# Patient Record
Sex: Female | Born: 1950
Health system: Southern US, Community
[De-identification: ages and names within clinical notes are randomized; demographics above are authoritative.]

## PROBLEM LIST (undated history)

## (undated) DIAGNOSIS — E785 Hyperlipidemia, unspecified: Secondary | ICD-10-CM

## (undated) DIAGNOSIS — K219 Gastro-esophageal reflux disease without esophagitis: Secondary | ICD-10-CM

## (undated) DIAGNOSIS — I10 Essential (primary) hypertension: Secondary | ICD-10-CM

## (undated) DIAGNOSIS — N6310 Unspecified lump in the right breast, unspecified quadrant: Secondary | ICD-10-CM

## (undated) DIAGNOSIS — Z923 Personal history of irradiation: Secondary | ICD-10-CM

## (undated) DIAGNOSIS — H269 Unspecified cataract: Secondary | ICD-10-CM

## (undated) DIAGNOSIS — C801 Malignant (primary) neoplasm, unspecified: Secondary | ICD-10-CM

## (undated) DIAGNOSIS — C50919 Malignant neoplasm of unspecified site of unspecified female breast: Secondary | ICD-10-CM

## (undated) DIAGNOSIS — M1990 Unspecified osteoarthritis, unspecified site: Secondary | ICD-10-CM

## (undated) HISTORY — DX: Malignant (primary) neoplasm, unspecified: C80.1

## (undated) HISTORY — PX: EYE SURGERY: SHX253

## (undated) HISTORY — DX: Essential (primary) hypertension: I10

## (undated) HISTORY — DX: Unspecified cataract: H26.9

## (undated) HISTORY — DX: Malignant neoplasm of unspecified site of unspecified female breast: C50.919

## (undated) HISTORY — PX: ROBOTIC ASSISTED LAPAROSCOPIC OVARIAN CYSTECTOMY: SHX6081

## (undated) HISTORY — PX: BREAST BIOPSY: SHX20

## (undated) HISTORY — DX: Hyperlipidemia, unspecified: E78.5

---
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
## 1979-03-03 HISTORY — PX: ECTOPIC PREGNANCY SURGERY: SHX613

## 1998-08-08 ENCOUNTER — Other Ambulatory Visit: Admission: RE | Admit: 1998-08-08 | Discharge: 1998-08-08 | Payer: Self-pay | Admitting: *Deleted

## 1999-09-22 ENCOUNTER — Other Ambulatory Visit: Admission: RE | Admit: 1999-09-22 | Discharge: 1999-09-22 | Payer: Self-pay | Admitting: *Deleted

## 2000-01-28 ENCOUNTER — Other Ambulatory Visit: Admission: RE | Admit: 2000-01-28 | Discharge: 2000-01-28 | Payer: Self-pay | Admitting: *Deleted

## 2000-02-27 ENCOUNTER — Encounter (INDEPENDENT_AMBULATORY_CARE_PROVIDER_SITE_OTHER): Payer: Self-pay | Admitting: Specialist

## 2000-02-27 ENCOUNTER — Other Ambulatory Visit: Admission: RE | Admit: 2000-02-27 | Discharge: 2000-02-27 | Payer: Self-pay | Admitting: *Deleted

## 2000-09-07 ENCOUNTER — Other Ambulatory Visit: Admission: RE | Admit: 2000-09-07 | Discharge: 2000-09-07 | Payer: Self-pay | Admitting: *Deleted

## 2001-09-12 ENCOUNTER — Other Ambulatory Visit: Admission: RE | Admit: 2001-09-12 | Discharge: 2001-09-12 | Payer: Self-pay | Admitting: *Deleted

## 2002-11-29 ENCOUNTER — Other Ambulatory Visit: Admission: RE | Admit: 2002-11-29 | Discharge: 2002-11-29 | Payer: Self-pay | Admitting: *Deleted

## 2003-12-20 ENCOUNTER — Other Ambulatory Visit: Admission: RE | Admit: 2003-12-20 | Discharge: 2003-12-20 | Payer: Self-pay | Admitting: *Deleted

## 2005-06-08 ENCOUNTER — Other Ambulatory Visit: Admission: RE | Admit: 2005-06-08 | Discharge: 2005-06-08 | Payer: Self-pay | Admitting: *Deleted

## 2006-06-30 ENCOUNTER — Other Ambulatory Visit: Admission: RE | Admit: 2006-06-30 | Discharge: 2006-06-30 | Payer: Self-pay | Admitting: *Deleted

## 2007-08-23 ENCOUNTER — Other Ambulatory Visit: Admission: RE | Admit: 2007-08-23 | Discharge: 2007-08-23 | Payer: Self-pay | Admitting: Gynecology

## 2007-11-29 ENCOUNTER — Ambulatory Visit: Payer: Self-pay | Admitting: Unknown Physician Specialty

## 2007-12-20 ENCOUNTER — Ambulatory Visit: Payer: Self-pay | Admitting: Unknown Physician Specialty

## 2008-05-01 ENCOUNTER — Ambulatory Visit: Payer: Self-pay | Admitting: Internal Medicine

## 2008-12-13 ENCOUNTER — Encounter: Payer: Self-pay | Admitting: Anesthesiology

## 2008-12-31 ENCOUNTER — Encounter: Payer: Self-pay | Admitting: Anesthesiology

## 2010-11-28 ENCOUNTER — Ambulatory Visit: Payer: Self-pay | Admitting: Internal Medicine

## 2012-01-15 ENCOUNTER — Other Ambulatory Visit: Payer: Self-pay | Admitting: Gynecology

## 2012-01-15 DIAGNOSIS — R928 Other abnormal and inconclusive findings on diagnostic imaging of breast: Secondary | ICD-10-CM

## 2012-01-20 ENCOUNTER — Ambulatory Visit
Admission: RE | Admit: 2012-01-20 | Discharge: 2012-01-20 | Disposition: A | Discharge status: Home / Self Care | Payer: BC Managed Care – PPO | Source: Ambulatory Visit | Attending: Gynecology | Admitting: Gynecology

## 2012-01-20 DIAGNOSIS — R928 Other abnormal and inconclusive findings on diagnostic imaging of breast: Secondary | ICD-10-CM

## 2012-08-16 ENCOUNTER — Ambulatory Visit: Payer: Self-pay | Admitting: Internal Medicine

## 2012-08-19 ENCOUNTER — Ambulatory Visit: Payer: Self-pay | Admitting: Internal Medicine

## 2012-08-23 LAB — CBC CANCER CENTER
Basophil #: 0 x10 3/mm (ref 0.0–0.1)
Eosinophil #: 0.1 x10 3/mm (ref 0.0–0.7)
Eosinophil %: 2.2 %
HGB: 14.2 g/dL (ref 12.0–16.0)
MCV: 93 fL (ref 80–100)
Monocyte #: 0.3 x10 3/mm (ref 0.2–0.9)
Neutrophil #: 2.7 x10 3/mm (ref 1.4–6.5)
Platelet: 185 x10 3/mm (ref 150–440)
RBC: 4.45 10*6/uL (ref 3.80–5.20)
RDW: 13 % (ref 11.5–14.5)
WBC: 5.8 x10 3/mm (ref 3.6–11.0)

## 2012-08-30 ENCOUNTER — Ambulatory Visit: Payer: Self-pay | Admitting: Internal Medicine

## 2015-04-05 ENCOUNTER — Other Ambulatory Visit: Payer: Self-pay | Admitting: Obstetrics and Gynecology

## 2015-04-05 DIAGNOSIS — R928 Other abnormal and inconclusive findings on diagnostic imaging of breast: Secondary | ICD-10-CM

## 2015-04-12 ENCOUNTER — Ambulatory Visit
Admission: RE | Admit: 2015-04-12 | Discharge: 2015-04-12 | Disposition: A | Discharge status: Home / Self Care | Payer: BC Managed Care – PPO | Source: Ambulatory Visit | Attending: Obstetrics and Gynecology | Admitting: Obstetrics and Gynecology

## 2015-04-12 ENCOUNTER — Other Ambulatory Visit: Payer: Self-pay | Admitting: Obstetrics and Gynecology

## 2015-04-12 DIAGNOSIS — R928 Other abnormal and inconclusive findings on diagnostic imaging of breast: Secondary | ICD-10-CM

## 2015-04-17 ENCOUNTER — Other Ambulatory Visit: Payer: Self-pay | Admitting: Obstetrics and Gynecology

## 2015-04-17 DIAGNOSIS — R928 Other abnormal and inconclusive findings on diagnostic imaging of breast: Secondary | ICD-10-CM

## 2015-04-18 ENCOUNTER — Ambulatory Visit
Admission: RE | Admit: 2015-04-18 | Discharge: 2015-04-18 | Disposition: A | Discharge status: Home / Self Care | Payer: BC Managed Care – PPO | Source: Ambulatory Visit | Attending: Obstetrics and Gynecology | Admitting: Obstetrics and Gynecology

## 2015-04-18 DIAGNOSIS — R928 Other abnormal and inconclusive findings on diagnostic imaging of breast: Secondary | ICD-10-CM

## 2015-04-29 ENCOUNTER — Other Ambulatory Visit: Payer: Self-pay | Admitting: Nurse Practitioner

## 2015-04-29 DIAGNOSIS — K449 Diaphragmatic hernia without obstruction or gangrene: Secondary | ICD-10-CM

## 2015-04-29 DIAGNOSIS — K219 Gastro-esophageal reflux disease without esophagitis: Secondary | ICD-10-CM

## 2015-05-07 ENCOUNTER — Ambulatory Visit: Payer: BC Managed Care – PPO

## 2015-05-07 ENCOUNTER — Other Ambulatory Visit: Payer: BC Managed Care – PPO

## 2015-05-16 ENCOUNTER — Ambulatory Visit: Payer: BC Managed Care – PPO

## 2015-05-16 ENCOUNTER — Other Ambulatory Visit: Payer: Self-pay | Admitting: Nurse Practitioner

## 2015-05-16 ENCOUNTER — Ambulatory Visit: Admission: RE | Admit: 2015-05-16 | Payer: BC Managed Care – PPO | Source: Ambulatory Visit

## 2015-05-16 ENCOUNTER — Ambulatory Visit
Admission: RE | Admit: 2015-05-16 | Discharge: 2015-05-16 | Disposition: A | Discharge status: Home / Self Care | Payer: BC Managed Care – PPO | Source: Ambulatory Visit | Attending: Nurse Practitioner | Admitting: Nurse Practitioner

## 2015-05-16 DIAGNOSIS — K219 Gastro-esophageal reflux disease without esophagitis: Secondary | ICD-10-CM | POA: Diagnosis present

## 2015-05-16 DIAGNOSIS — K224 Dyskinesia of esophagus: Secondary | ICD-10-CM | POA: Diagnosis not present

## 2015-05-16 DIAGNOSIS — K449 Diaphragmatic hernia without obstruction or gangrene: Secondary | ICD-10-CM | POA: Insufficient documentation

## 2015-05-16 DIAGNOSIS — K440 Diaphragmatic hernia with obstruction, without gangrene: Secondary | ICD-10-CM | POA: Diagnosis present

## 2016-05-07 ENCOUNTER — Other Ambulatory Visit: Payer: Self-pay | Admitting: Obstetrics and Gynecology

## 2016-05-07 DIAGNOSIS — R921 Mammographic calcification found on diagnostic imaging of breast: Secondary | ICD-10-CM

## 2016-05-08 ENCOUNTER — Ambulatory Visit
Admission: RE | Admit: 2016-05-08 | Discharge: 2016-05-08 | Disposition: A | Discharge status: Home / Self Care | Payer: Medicare Other | Source: Ambulatory Visit | Attending: Obstetrics and Gynecology | Admitting: Obstetrics and Gynecology

## 2016-05-08 DIAGNOSIS — R921 Mammographic calcification found on diagnostic imaging of breast: Secondary | ICD-10-CM

## 2017-06-01 ENCOUNTER — Encounter: Payer: Self-pay | Admitting: Nurse Practitioner

## 2017-06-09 ENCOUNTER — Other Ambulatory Visit: Payer: Self-pay | Admitting: Internal Medicine

## 2017-06-16 ENCOUNTER — Ambulatory Visit: Payer: Medicare Other | Admitting: Internal Medicine

## 2017-06-16 ENCOUNTER — Encounter: Payer: Self-pay | Admitting: Internal Medicine

## 2017-06-16 VITALS — BP 156/86 | HR 62 | Resp 16 | Ht 62.5 in | Wt 147.2 lb

## 2017-06-16 DIAGNOSIS — Z0001 Encounter for general adult medical examination with abnormal findings: Secondary | ICD-10-CM

## 2017-06-16 DIAGNOSIS — Z124 Encounter for screening for malignant neoplasm of cervix: Secondary | ICD-10-CM

## 2017-06-16 DIAGNOSIS — R300 Dysuria: Secondary | ICD-10-CM | POA: Diagnosis not present

## 2017-06-16 DIAGNOSIS — R030 Elevated blood-pressure reading, without diagnosis of hypertension: Secondary | ICD-10-CM | POA: Diagnosis not present

## 2017-06-16 DIAGNOSIS — Q446 Cystic disease of liver: Secondary | ICD-10-CM | POA: Diagnosis not present

## 2017-06-16 DIAGNOSIS — L659 Nonscarring hair loss, unspecified: Secondary | ICD-10-CM

## 2017-06-16 DIAGNOSIS — E782 Mixed hyperlipidemia: Secondary | ICD-10-CM | POA: Diagnosis not present

## 2017-06-16 MED ORDER — OMEPRAZOLE 20 MG PO CPDR: 20.0000 mg | DELAYED_RELEASE_CAPSULE | Freq: Every day | ORAL | 3 refills | Status: DC

## 2017-06-16 NOTE — Progress Notes (Signed)
[facility] [place], [place] [address]  Internal MEDICINE  Office Visit Note  Patient Name: Kristen Reid  [id number]  [id number]  Date of Service: 06/16/2017  Chief Complaint  Patient presents with  . Annual Exam     HPI Pt is here for routine health maintenance examination. Pt feels well except heartburn occasionally. She does have h/o hyperlipidemia. Thinning of hair, She also has cystic disease of the liver without any elevation in her transaminases   Current Medication: Outpatient Encounter Medications as of 06/16/2017  Medication Sig  . calcium carbonate (CALCIUM 600) 600 MG TABS tablet Take 600 mg by mouth 2 (two) times daily with a meal.  . Cholecalciferol (VITAMIN D3) 2000 units TABS Take by mouth.  . clobetasol (TEMOVATE) 0.05 % external solution APPLY TO AFFECTED AREA EVERY DAY AT BEDTIME  . co-enzyme Q-10 30 MG capsule Take 30 mg by mouth 3 (three) times daily.  . Cyanocobalamin 1000 MCG SUBL Place under the tongue.  . dutasteride (AVODART) 0.5 MG capsule Take 0.5 mg by mouth daily.  . Flaxseed, Linseed, (FLAX SEED OIL) 1300 MG CAPS Take by mouth.  Marland Kitchen Lifitegrast (XIIDRA) 5 % SOLN Apply to eye.  . loratadine (ALLERGY) 10 MG tablet Take 10 mg by mouth daily.  Marland Kitchen omeprazole (PRILOSEC) 20 MG capsule Take 1 capsule (20 mg total) by mouth daily.  . [DISCONTINUED] omeprazole (PRILOSEC) 20 MG capsule Take 20 mg by mouth daily.   No facility-administered encounter medications on file as of 06/16/2017.     Surgical History: Past Surgical History:  Procedure Laterality Date  . BREAST BIOPSY Right 2010 and 04/18/2015  . EYE SURGERY Right    keratectomy     Medical History: Past Medical History:  Diagnosis Date  . Cataract   . Hyperlipidemia     Family History: Family History  Problem Relation Age of Onset  . Heart disease Mother   . Stroke Father     Review of Systems  Constitutional: Negative for chills, fatigue and  unexpected weight change.  HENT: Positive for postnasal drip. Negative for congestion, rhinorrhea, sneezing and sore throat.   Eyes: Negative for redness.  Respiratory: Negative for cough, chest tightness and shortness of breath.   Cardiovascular: Negative for chest pain and palpitations.  Gastrointestinal: Negative for abdominal pain, constipation, diarrhea, nausea and vomiting.       Heartburn  Genitourinary: Negative for dysuria and frequency.  Musculoskeletal: Negative for arthralgias, back pain, joint swelling and neck pain.  Skin: Negative for rash.  Neurological: Negative.  Negative for tremors and numbness.  Hematological: Negative for adenopathy. Does not bruise/bleed easily.  Psychiatric/Behavioral: Negative for behavioral problems (Depression), sleep disturbance and suicidal ideas. The patient is not nervous/anxious.     Vital Signs: BP (!) 156/86   Pulse 62   Resp 16   Ht 5' 2.5" (1.588 m)   Wt 147 lb 3.2 oz (66.8 kg)   SpO2 99%   BMI 26.49 kg/m    Physical Exam  Constitutional: She appears well-developed and well-nourished. No distress.  HENT:  Head: Normocephalic and atraumatic.  Right Ear: External ear normal.  Left Ear: External ear normal.  Nose: Nose normal.  Mouth/Throat: Oropharynx is clear and moist. No oropharyngeal exudate.  Eyes: Pupils are equal, round, and reactive to light. Conjunctivae and EOM are normal. Right eye exhibits no discharge. Left eye exhibits no discharge. No scleral icterus.  Neck: Normal range of motion. Neck supple. No JVD present. No tracheal  deviation present. No thyromegaly present.  Cardiovascular: Normal rate, regular rhythm, normal heart sounds and intact distal pulses. Exam reveals no gallop and no friction rub.  No murmur heard. Pulmonary/Chest: Effort normal and breath sounds normal. No stridor. No respiratory distress. She has no wheezes. She has no rales. She exhibits no tenderness. Right breast exhibits no mass. Left breast  exhibits no mass.  Abdominal: Soft. Bowel sounds are normal. She exhibits no distension and no mass. There is no rebound and no guarding.  Genitourinary: Vagina normal and uterus normal.  Musculoskeletal: Normal range of motion. She exhibits no edema, tenderness or deformity.  Lymphadenopathy:    She has no cervical adenopathy.  Neurological: She is alert. She has normal reflexes. No cranial nerve deficit. She exhibits normal muscle tone. Coordination normal.  Skin: Skin is warm and dry. No rash noted. She is not diaphoretic. No erythema. No pallor.  Psychiatric: She has a normal mood and affect. Her behavior is normal. Judgment and thought content normal.   Assessment/Plan: 1. Encounter for general adult medical examination with abnormal findings - Updated mammogram  2. Mixed hyperlipidemia - Lipid Panel With LDL/HDL Ratio  3. Alopecia of scalp - Pt is menopausal  - CBC with Differential/Platelet - TSH - T4, free  4. Routine cervical smear - Pap is performed   5. Dysuria - Urinalysis, Routine w reflex microscopic  6. Cystic disease of liver - Comprehensive metabolic panel/ Might need u/s f/u   7. Elevated BP without diagnosis of hypertension - Monitor BP at home   General Counseling: Kristen Reid understanding of the findings of todays visit and agrees with plan of treatment. I have discussed any further diagnostic evaluation that may be needed or ordered today. We also reviewed her medications today. she has been encouraged to call the office with any questions or concerns that should arise related to todays visit. Cardiac risk factor modification:  1. Control blood pressure. 2. Exercise as prescribed. 3. Follow low sodium, low fat diet. and low fat and low cholestrol diet. 4. Take ASA 81mg  once a day.  Orders Placed This Encounter  Procedures  . Urinalysis, Routine w reflex microscopic  . CBC with Differential/Platelet  . Lipid Panel With LDL/HDL Ratio  . TSH   . T4, free  . Comprehensive metabolic panel    Meds ordered this encounter  Medications  . omeprazole (PRILOSEC) 20 MG capsule    Sig: Take 1 capsule (20 mg total) by mouth daily.    Dispense:  90 capsule    Refill:  3   Time spent: [place], MD  Internal Medicine

## 2017-06-17 LAB — CBC WITH DIFFERENTIAL/PLATELET
Basophils Absolute: 0 10*3/uL (ref 0.0–0.2)
Basos: 1 %
EOS (ABSOLUTE): 0.1 10*3/uL (ref 0.0–0.4)
EOS: 2 %
HEMATOCRIT: 42.3 % (ref 34.0–46.6)
HEMOGLOBIN: 14.4 g/dL (ref 11.1–15.9)
Immature Grans (Abs): 0 10*3/uL (ref 0.0–0.1)
Immature Granulocytes: 0 %
LYMPHS ABS: 1.8 10*3/uL (ref 0.7–3.1)
Lymphs: 46 %
MCH: 32.4 pg (ref 26.6–33.0)
MCHC: 34 g/dL (ref 31.5–35.7)
MCV: 95 fL (ref 79–97)
MONOCYTES: 7 %
MONOS ABS: 0.3 10*3/uL (ref 0.1–0.9)
NEUTROS ABS: 1.7 10*3/uL (ref 1.4–7.0)
Neutrophils: 44 %
Platelets: 195 10*3/uL (ref 150–379)
RBC: 4.45 x10E6/uL (ref 3.77–5.28)
RDW: 13 % (ref 12.3–15.4)
WBC: 3.8 10*3/uL (ref 3.4–10.8)

## 2017-06-17 LAB — LIPID PANEL WITH LDL/HDL RATIO
Cholesterol, Total: 286 mg/dL — ABNORMAL HIGH (ref 100–199)
HDL: 88 mg/dL (ref >39)
LDL CALC: 178 mg/dL — AB (ref 0–99)
LDL/HDL RATIO: 2 ratio (ref 0.0–3.2)
Triglycerides: 102 mg/dL (ref 0–149)
VLDL Cholesterol Cal: 20 mg/dL (ref 5–40)

## 2017-06-17 LAB — URINALYSIS, ROUTINE W REFLEX MICROSCOPIC
Bilirubin, UA: NEGATIVE
Glucose, UA: NEGATIVE
Ketones, UA: NEGATIVE
LEUKOCYTES UA: NEGATIVE
NITRITE UA: NEGATIVE
Protein, UA: NEGATIVE
RBC, UA: NEGATIVE
Specific Gravity, UA: 1.02 (ref 1.005–1.030)
Urobilinogen, Ur: 0.2 mg/dL (ref 0.2–1.0)
pH, UA: 6 (ref 5.0–7.5)

## 2017-06-17 LAB — COMPREHENSIVE METABOLIC PANEL
A/G RATIO: 2.1 (ref 1.2–2.2)
ALK PHOS: 60 IU/L (ref 39–117)
ALT: 19 IU/L (ref 0–32)
AST: 27 IU/L (ref 0–40)
Albumin: 4.6 g/dL (ref 3.6–4.8)
BILIRUBIN TOTAL: 0.9 mg/dL (ref 0.0–1.2)
BUN/Creatinine Ratio: 15 (ref 12–28)
BUN: 10 mg/dL (ref 8–27)
CHLORIDE: 102 mmol/L (ref 96–106)
CO2: 23 mmol/L (ref 20–29)
Calcium: 9.8 mg/dL (ref 8.7–10.3)
Creatinine, Ser: 0.68 mg/dL (ref 0.57–1.00)
GFR calc non Af Amer: 91 mL/min/{1.73_m2} (ref >59)
GFR, EST AFRICAN AMERICAN: 105 mL/min/{1.73_m2} (ref >59)
GLUCOSE: 93 mg/dL (ref 65–99)
Globulin, Total: 2.2 g/dL (ref 1.5–4.5)
POTASSIUM: 4.3 mmol/L (ref 3.5–5.2)
Sodium: 144 mmol/L (ref 134–144)
Total Protein: 6.8 g/dL (ref 6.0–8.5)

## 2017-06-17 LAB — T4, FREE: FREE T4: 1.16 ng/dL (ref 0.82–1.77)

## 2017-06-17 LAB — TSH: TSH: 2.4 u[IU]/mL (ref 0.450–4.500)

## 2017-06-21 NOTE — Progress Notes (Signed)
All labs look within normal range except worsening lipid profile, mail low fat low chol diet, She should be on therapy too, ask her if it is ok to send RX

## 2017-06-21 NOTE — Progress Notes (Signed)
Patient was notified of increasing lipid profile. I asked if she would like rx but she rather do diet and exercise. She also said you would like to do a calcium deposit check on her and that you could sent it to [place].

## 2017-06-24 ENCOUNTER — Other Ambulatory Visit: Payer: Self-pay | Admitting: Internal Medicine

## 2017-06-24 DIAGNOSIS — E782 Mixed hyperlipidemia: Secondary | ICD-10-CM

## 2017-06-24 NOTE — Progress Notes (Signed)
Forward this to beth, I have entered an order for scan. Its cash only

## 2017-07-10 ENCOUNTER — Encounter: Payer: Self-pay | Admitting: Internal Medicine

## 2017-07-19 ENCOUNTER — Other Ambulatory Visit: Payer: Self-pay

## 2017-07-19 DIAGNOSIS — E785 Hyperlipidemia, unspecified: Secondary | ICD-10-CM

## 2017-07-19 NOTE — Progress Notes (Signed)
Dr. Laurelyn Sickle patient with [facility]

## 2017-08-05 ENCOUNTER — Telehealth: Payer: Self-pay | Admitting: Internal Medicine

## 2017-08-05 ENCOUNTER — Ambulatory Visit (INDEPENDENT_AMBULATORY_CARE_PROVIDER_SITE_OTHER)
Admission: RE | Admit: 2017-08-05 | Discharge: 2017-08-05 | Disposition: A | Discharge status: Home / Self Care | Payer: Medicare Other | Source: Ambulatory Visit | Attending: Cardiovascular Disease | Admitting: Cardiovascular Disease

## 2017-08-05 DIAGNOSIS — E785 Hyperlipidemia, unspecified: Secondary | ICD-10-CM

## 2017-08-05 NOTE — Telephone Encounter (Signed)
Pt advised on test results

## 2017-10-20 ENCOUNTER — Telehealth: Payer: Self-pay

## 2017-10-20 NOTE — Telephone Encounter (Signed)
Spoke with patient informing her that her cologuard results came back negative

## 2018-06-21 ENCOUNTER — Encounter: Payer: Self-pay | Admitting: Internal Medicine

## 2018-08-23 ENCOUNTER — Other Ambulatory Visit: Payer: Self-pay

## 2018-08-23 ENCOUNTER — Ambulatory Visit: Payer: Medicare Other | Admitting: Internal Medicine

## 2018-08-23 ENCOUNTER — Encounter: Payer: Self-pay | Admitting: Internal Medicine

## 2018-08-23 VITALS — BP 150/79 | HR 58 | Resp 16 | Ht 62.5 in | Wt 136.6 lb

## 2018-08-23 DIAGNOSIS — R5383 Other fatigue: Secondary | ICD-10-CM

## 2018-08-23 DIAGNOSIS — E782 Mixed hyperlipidemia: Secondary | ICD-10-CM

## 2018-08-23 DIAGNOSIS — Z0001 Encounter for general adult medical examination with abnormal findings: Secondary | ICD-10-CM

## 2018-08-23 DIAGNOSIS — R030 Elevated blood-pressure reading, without diagnosis of hypertension: Secondary | ICD-10-CM

## 2018-08-23 DIAGNOSIS — J342 Deviated nasal septum: Secondary | ICD-10-CM

## 2018-08-23 DIAGNOSIS — I83813 Varicose veins of bilateral lower extremities with pain: Secondary | ICD-10-CM

## 2018-08-23 DIAGNOSIS — R300 Dysuria: Secondary | ICD-10-CM

## 2018-08-23 NOTE — Progress Notes (Signed)
[facility] [place], [place] [address]  Internal MEDICINE  Office Visit Note  Patient Name: Kristen Reid  [id number]  [id number]  Date of Service: 08/23/2018  Chief Complaint  Patient presents with  . Medicare Wellness  . Hyperlipidemia    Pt is here for routine health maintenance examination c/o fatigue off an on, she has not been taking her b12, She is c/o leg pain due t varicose veins, unable to sleep at night. Takes motrin, will like to see a specialist Her other complaints is nasal congestion , she has h/o deviated septum and thinks it might be getting worse, GERD is under good control with meds, Her Cardaiac calcium scoring was normal.Keeps a blood pressure log, most of the ho,e readings are normal .  Hyperlipidemia This is a chronic problem. The current episode started more than 1 year ago. The problem is controlled. Recent lipid tests were reviewed and are variable. Pertinent negatives include no chest pain or shortness of breath. She is currently on no antihyperlipidemic treatment. The current treatment provides mild improvement of lipids.    Current Medication: Outpatient Encounter Medications as of 08/23/2018  Medication Sig  . Apple Cider Vinegar 300 MG TABS Take by mouth.  . calcium carbonate (CALCIUM 600) 600 MG TABS tablet Take 600 mg by mouth 2 (two) times daily with a meal.  . Cholecalciferol (VITAMIN D3) 2000 units TABS Take by mouth.  . clobetasol (TEMOVATE) 0.05 % external solution APPLY TO AFFECTED AREA EVERY DAY AT BEDTIME  . Coenzyme Q10 (COQ10) 200 MG CAPS Take by mouth.  . dutasteride (AVODART) 0.5 MG capsule Take 0.5 mg by mouth daily.  Marland Kitchen dutasteride (AVODART) 0.5 MG capsule TAKE ONE PILL DAILY X 1 WEEK, THEN ONE PILL ONCE WEEKLY  . famotidine (PEPCID) 20 MG tablet   . Flaxseed, Linseed, (FLAX SEED OIL) 1300 MG CAPS Take by mouth.  Marland Kitchen Lifitegrast (XIIDRA) 5 % SOLN Apply to eye.  . loratadine (CLARITIN) 10 MG tablet Take by  mouth.  Marland Kitchen MILK THISTLE PO Take by mouth.  . Cholecalciferol (VITAMIN D3) 50 MCG (2000 UT) capsule Take by mouth.  . Cyanocobalamin 1000 MCG SUBL Place under the tongue.  . [DISCONTINUED] co-enzyme Q-10 30 MG capsule Take 30 mg by mouth 3 (three) times daily.  . [DISCONTINUED] Flaxseed, Linseed, (FLAX SEED OIL) 1300 MG CAPS Take by mouth.  . [DISCONTINUED] loratadine (ALLERGY) 10 MG tablet Take 10 mg by mouth daily.  . [DISCONTINUED] omeprazole (PRILOSEC) 20 MG capsule Take 1 capsule (20 mg total) by mouth daily. (Patient not taking: Reported on 08/23/2018)  . [DISCONTINUED] omeprazole (PRILOSEC) 20 MG capsule Take by mouth.   No facility-administered encounter medications on file as of 08/23/2018.     Surgical History: Past Surgical History:  Procedure Laterality Date  . BREAST BIOPSY Right 2010 and 04/18/2015  . EYE SURGERY Right    keratectomy     Medical History: Past Medical History:  Diagnosis Date  . Cataract   . Hyperlipidemia     Family History: Family History  Problem Relation Age of Onset  . Heart disease Mother   . Stroke Father    Review of Systems  Constitutional: Negative for chills, diaphoresis and fatigue.  HENT: Positive for sinus pressure. Negative for ear pain and postnasal drip.   Eyes: Negative for photophobia, discharge, redness, itching and visual disturbance.  Respiratory: Negative for cough, shortness of breath and wheezing.   Cardiovascular: Positive for leg swelling. Negative for chest  pain and palpitations.       Varicose veins   Gastrointestinal: Negative for abdominal pain, constipation, diarrhea, nausea and vomiting.  Genitourinary: Negative for dysuria and flank pain.  Musculoskeletal: Negative for arthralgias, back pain, gait problem and neck pain.  Skin: Negative for color change.  Allergic/Immunologic: Negative for environmental allergies and food allergies.  Neurological: Negative for dizziness and headaches.  Hematological: Does not  bruise/bleed easily.  Psychiatric/Behavioral: Negative for agitation, behavioral problems (depression) and hallucinations.    Vital Signs: BP (!) 150/79   Pulse (!) 58   Resp 16   Ht 5' 2.5" (1.588 m)   Wt 136 lb 9.6 oz (62 kg)   SpO2 97%   BMI 24.59 kg/m    Physical Exam Constitutional:      General: She is not in acute distress.    Appearance: She is well-developed. She is not diaphoretic.  HENT:     Head: Normocephalic and atraumatic.     Mouth/Throat:     Pharynx: No oropharyngeal exudate.  Eyes:     Pupils: Pupils are equal, round, and reactive to light.  Neck:     Musculoskeletal: Normal range of motion and neck supple.     Thyroid: No thyromegaly.     Vascular: No JVD.     Trachea: No tracheal deviation.  Cardiovascular:     Rate and Rhythm: Normal rate and regular rhythm.     Heart sounds: Normal heart sounds. No murmur. No friction rub. No gallop.   Pulmonary:     Effort: Pulmonary effort is normal. No respiratory distress.     Breath sounds: No wheezing or rales.  Chest:     Chest wall: No tenderness.  Abdominal:     General: Bowel sounds are normal.     Palpations: Abdomen is soft.  Musculoskeletal: Normal range of motion.        General: Tenderness present.     Comments: varicosities noticed   Lymphadenopathy:     Cervical: No cervical adenopathy.  Skin:    General: Skin is warm and dry.  Neurological:     Mental Status: She is alert and oriented to person, place, and time.     Cranial Nerves: No cranial nerve deficit.  Psychiatric:        Behavior: Behavior normal.        Thought Content: Thought content normal.        Judgment: Judgment normal.    Assessment/Plan: 1. Encounter for general adult medical examination with abnormal findings - She is utd, pap and mammogram is scheduled with OBgyn  2. Mixed hyperlipidemia - Diet controlled  - Lipid Panel With LDL/HDL Ratio  3. Elevated BP without diagnosis of hypertension - Continue to monitor  at home  4. Fatigue, unspecified type - B12 and Folate Panel - Ferritin; Future - Ferritin - T4, free - TSH - CBC with Differential/Platelet  5. Dysuria - UA/M w/rflx Culture, Routine  6. Varicose veins of bilateral lower extremities with pain - - Continue Nsaids and compression stockings  - Ambulatory referral to Vascular Surgery  7. Deviated nasal septum - Ambulatory referral to ENT  General Counseling: Alben Spittle understanding of the findings of todays visit and agrees with plan of treatment. I have discussed any further diagnostic evaluation that may be needed or ordered today. We also reviewed her medications today. she has been encouraged to call the office with any questions or concerns that should arise related to todays visit.    Orders  Placed This Encounter  Procedures  . UA/M w/rflx Culture, Routine  . CBC with Differential/Platelet  . Lipid Panel With LDL/HDL Ratio  . TSH  . T4, free  . Comprehensive metabolic panel  . B12 and Folate Panel  . Ferritin     Time spent:25 [place], MD  Internal Medicine

## 2018-08-23 NOTE — Progress Notes (Signed)
Pt blood pressure elevated  Pulse is low Pt stated that when it was taken before coming it was 104/76 Informed provider

## 2018-08-24 LAB — CBC WITH DIFFERENTIAL/PLATELET
Basophils Absolute: 0 10*3/uL (ref 0.0–0.2)
Basos: 1 %
EOS (ABSOLUTE): 0.1 10*3/uL (ref 0.0–0.4)
Eos: 1 %
Hematocrit: 40.2 % (ref 34.0–46.6)
Hemoglobin: 13.9 g/dL (ref 11.1–15.9)
Immature Grans (Abs): 0 10*3/uL (ref 0.0–0.1)
Immature Granulocytes: 0 %
Lymphocytes Absolute: 2.6 10*3/uL (ref 0.7–3.1)
Lymphs: 51 %
MCH: 31.6 pg (ref 26.6–33.0)
MCHC: 34.6 g/dL (ref 31.5–35.7)
MCV: 91 fL (ref 79–97)
Monocytes Absolute: 0.4 10*3/uL (ref 0.1–0.9)
Monocytes: 8 %
Neutrophils Absolute: 1.9 10*3/uL (ref 1.4–7.0)
Neutrophils: 39 %
Platelets: 200 10*3/uL (ref 150–450)
RBC: 4.4 x10E6/uL (ref 3.77–5.28)
RDW: 12.7 % (ref 11.7–15.4)
WBC: 5 10*3/uL (ref 3.4–10.8)

## 2018-08-24 LAB — UA/M W/RFLX CULTURE, ROUTINE
Bilirubin, UA: NEGATIVE
Bilirubin, UA: NEGATIVE
Glucose, UA: NEGATIVE
Glucose, UA: NEGATIVE
Ketones, UA: NEGATIVE
Ketones, UA: NEGATIVE
Leukocytes,UA: NEGATIVE
Leukocytes,UA: NEGATIVE
Nitrite, UA: NEGATIVE
Nitrite, UA: NEGATIVE
Protein,UA: NEGATIVE
Protein,UA: NEGATIVE
RBC, UA: NEGATIVE
Specific Gravity, UA: 1.009 (ref 1.005–1.030)
Specific Gravity, UA: 1.01 (ref 1.005–1.030)
Urobilinogen, Ur: 0.2 mg/dL (ref 0.2–1.0)
Urobilinogen, Ur: 0.2 mg/dL (ref 0.2–1.0)
pH, UA: 5 (ref 5.0–7.5)
pH, UA: 5.5 (ref 5.0–7.5)

## 2018-08-24 LAB — MICROSCOPIC EXAMINATION
Bacteria, UA: NONE SEEN
Bacteria, UA: NONE SEEN
Casts: NONE SEEN /LPF
RBC, Urine: NONE SEEN /hpf (ref 0–2)

## 2018-08-24 LAB — COMPREHENSIVE METABOLIC PANEL
ALT: 13 IU/L (ref 0–32)
AST: 17 IU/L (ref 0–40)
Albumin/Globulin Ratio: 2.1 (ref 1.2–2.2)
Albumin: 4.6 g/dL (ref 3.8–4.8)
Alkaline Phosphatase: 55 IU/L (ref 39–117)
BUN/Creatinine Ratio: 19 (ref 12–28)
BUN: 13 mg/dL (ref 8–27)
Bilirubin Total: 1.3 mg/dL — ABNORMAL HIGH (ref 0.0–1.2)
CO2: 23 mmol/L (ref 20–29)
Calcium: 9.6 mg/dL (ref 8.7–10.3)
Chloride: 102 mmol/L (ref 96–106)
Creatinine, Ser: 0.67 mg/dL (ref 0.57–1.00)
GFR calc Af Amer: 105 mL/min/{1.73_m2} (ref >59)
GFR calc non Af Amer: 91 mL/min/{1.73_m2} (ref >59)
Globulin, Total: 2.2 g/dL (ref 1.5–4.5)
Glucose: 93 mg/dL (ref 65–99)
Potassium: 3.8 mmol/L (ref 3.5–5.2)
Sodium: 141 mmol/L (ref 134–144)
Total Protein: 6.8 g/dL (ref 6.0–8.5)

## 2018-08-24 LAB — B12 AND FOLATE PANEL
Folate: 6.1 ng/mL (ref >3.0)
Vitamin B-12: 259 pg/mL (ref 232–1245)

## 2018-08-24 LAB — LIPID PANEL WITH LDL/HDL RATIO
Cholesterol, Total: 276 mg/dL — ABNORMAL HIGH (ref 100–199)
HDL: 102 mg/dL (ref >39)
LDL Calculated: 162 mg/dL — ABNORMAL HIGH (ref 0–99)
LDl/HDL Ratio: 1.6 ratio (ref 0.0–3.2)
Triglycerides: 58 mg/dL (ref 0–149)
VLDL Cholesterol Cal: 12 mg/dL (ref 5–40)

## 2018-08-24 LAB — TSH: TSH: 1.34 u[IU]/mL (ref 0.450–4.500)

## 2018-08-24 LAB — FERRITIN: Ferritin: 148 ng/mL (ref 15–150)

## 2018-08-24 LAB — T4, FREE: Free T4: 1.05 ng/dL (ref 0.82–1.77)

## 2018-08-25 ENCOUNTER — Telehealth: Payer: Self-pay

## 2018-08-25 NOTE — Telephone Encounter (Signed)
Pt advised labs within normal range except chol is elevated

## 2018-08-25 NOTE — Telephone Encounter (Signed)
-----   Message from Lavera Guise, MD sent at 08/25/2018 12:35 PM EDT ----- Please let pt know, blood work is wnl, except cholesterol which is bout the same, can give her numbers

## 2018-08-25 NOTE — Progress Notes (Signed)
Please let pt know, blood work is wnl, except cholesterol which is bout the same, can give her numbers

## 2018-09-14 DIAGNOSIS — I83813 Varicose veins of bilateral lower extremities with pain: Secondary | ICD-10-CM | POA: Insufficient documentation

## 2019-01-19 ENCOUNTER — Other Ambulatory Visit: Payer: Self-pay

## 2019-01-19 DIAGNOSIS — Z20822 Contact with and (suspected) exposure to covid-19: Secondary | ICD-10-CM

## 2019-01-21 LAB — NOVEL CORONAVIRUS, NAA: SARS-CoV-2, NAA: DETECTED — AB

## 2019-08-29 ENCOUNTER — Ambulatory Visit: Payer: Medicare Other | Admitting: Internal Medicine

## 2019-09-05 ENCOUNTER — Telehealth: Payer: Self-pay

## 2019-09-05 NOTE — Telephone Encounter (Signed)
Lmom to confirm and screen for 09-06-19 ov.

## 2019-09-06 ENCOUNTER — Other Ambulatory Visit: Payer: Self-pay

## 2019-09-06 ENCOUNTER — Ambulatory Visit (INDEPENDENT_AMBULATORY_CARE_PROVIDER_SITE_OTHER): Payer: Medicare PPO | Admitting: Internal Medicine

## 2019-09-06 ENCOUNTER — Encounter: Payer: Self-pay | Admitting: Internal Medicine

## 2019-09-06 VITALS — BP 148/82 | HR 60 | Temp 97.6°F | Resp 16 | Ht 62.5 in | Wt 141.2 lb

## 2019-09-06 DIAGNOSIS — R300 Dysuria: Secondary | ICD-10-CM

## 2019-09-06 DIAGNOSIS — E538 Deficiency of other specified B group vitamins: Secondary | ICD-10-CM | POA: Diagnosis not present

## 2019-09-06 DIAGNOSIS — H9313 Tinnitus, bilateral: Secondary | ICD-10-CM

## 2019-09-06 DIAGNOSIS — Z23 Encounter for immunization: Secondary | ICD-10-CM | POA: Diagnosis not present

## 2019-09-06 DIAGNOSIS — E782 Mixed hyperlipidemia: Secondary | ICD-10-CM

## 2019-09-06 DIAGNOSIS — Z0001 Encounter for general adult medical examination with abnormal findings: Secondary | ICD-10-CM | POA: Diagnosis not present

## 2019-09-06 DIAGNOSIS — Z1231 Encounter for screening mammogram for malignant neoplasm of breast: Secondary | ICD-10-CM | POA: Diagnosis not present

## 2019-09-06 MED ORDER — PNEUMOCOCCAL VAC POLYVALENT 25 MCG/0.5ML IJ INJ: 0.5000 mL | INJECTION | INTRAMUSCULAR | 0 refills | Status: AC

## 2019-09-06 MED ORDER — ROSUVASTATIN CALCIUM 5 MG PO TABS: 5.0000 mg | ORAL_TABLET | Freq: Every day | ORAL | 3 refills | Status: DC

## 2019-09-06 NOTE — Progress Notes (Signed)
[facility] [place], [place] [address]  Internal MEDICINE  Office Visit Note  Patient Name: Kristen Reid  [id number]  [id number]  Date of Service: 09/14/2019  Chief Complaint  Patient presents with  . Medicare Wellness  . Hyperlipidemia  . Quality Metric Gaps    mammogram, dexa scan, pna vaccine, colonoscopy, tetanus vaccine  . Neck Pain    not constant, has been going on for a year  . Wants to discuss tinnitus    HPI Pt is here for routine health maintenance examination. She is feeling well, still working. Occasional neck pain. C/o ringing in her ears, will like to see ENT. She does have hyperlipidemia however has declined therapy in the past. She does need pneumonia vaccine. B12 has been low in the past. She takes oral supplement  Her blood pressure is slightly elevated as well   Current Medication: Outpatient Encounter Medications as of 09/06/2019  Medication Sig  . Apple Cider Vinegar 300 MG TABS Take by mouth.  . calcium carbonate (CALCIUM 600) 600 MG TABS tablet Take 600 mg by mouth 2 (two) times daily with a meal.  . Cholecalciferol (VITAMIN D3) 2000 units TABS Take by mouth.  . Cholecalciferol (VITAMIN D3) 50 MCG (2000 UT) capsule Take by mouth.  . clobetasol (TEMOVATE) 0.05 % external solution APPLY TO AFFECTED AREA EVERY DAY AT BEDTIME  . Coenzyme Q10 (COQ10) 200 MG CAPS Take by mouth.  . Cyanocobalamin 1000 MCG SUBL Place under the tongue.  . dutasteride (AVODART) 0.5 MG capsule Take 0.5 mg by mouth daily.  Marland Kitchen dutasteride (AVODART) 0.5 MG capsule TAKE ONE PILL DAILY X 1 WEEK, THEN ONE PILL ONCE WEEKLY  . famotidine (PEPCID) 20 MG tablet   . Flaxseed, Linseed, (FLAX SEED OIL) 1300 MG CAPS Take by mouth.  Marland Kitchen Lifitegrast (XIIDRA) 5 % SOLN Apply to eye.  . loratadine (CLARITIN) 10 MG tablet Take by mouth.  Marland Kitchen MILK THISTLE PO Take by mouth.  . rosuvastatin (CRESTOR) 5 MG tablet Take 1 tablet (5 mg total) by mouth daily.   No  facility-administered encounter medications on file as of 09/06/2019.    Surgical History: Past Surgical History:  Procedure Laterality Date  . BREAST BIOPSY Right 2010 and 04/18/2015  . EYE SURGERY Right    keratectomy     Medical History: Past Medical History:  Diagnosis Date  . Cataract   . Hyperlipidemia     Family History: Family History  Problem Relation Age of Onset  . Heart disease Mother   . Stroke Father    Review of Systems  Constitutional: Negative for chills, diaphoresis and fatigue.  HENT: Positive for tinnitus. Negative for ear pain, postnasal drip and sinus pressure.   Eyes: Negative for photophobia, discharge, redness, itching and visual disturbance.  Respiratory: Negative for cough, shortness of breath and wheezing.   Cardiovascular: Negative for chest pain, palpitations and leg swelling.  Gastrointestinal: Negative for abdominal pain, constipation, diarrhea, nausea and vomiting.  Genitourinary: Negative for dysuria and flank pain.  Musculoskeletal: Positive for neck pain. Negative for arthralgias, back pain and gait problem.  Skin: Negative for color change.  Allergic/Immunologic: Negative for environmental allergies and food allergies.  Neurological: Negative for dizziness and headaches.  Hematological: Does not bruise/bleed easily.  Psychiatric/Behavioral: Negative for agitation, behavioral problems (depression) and hallucinations.     Vital Signs: BP (!) 148/82   Pulse 60   Temp 97.6 F (36.4 C)   Resp 16   Ht 5' 2.5" (  1.588 m)   Wt 141 lb 3.2 oz (64 kg)   SpO2 97%   BMI 25.41 kg/m    Physical Exam Constitutional:      General: She is not in acute distress.    Appearance: She is well-developed. She is not diaphoretic.  HENT:     Head: Normocephalic and atraumatic.     Mouth/Throat:     Pharynx: No oropharyngeal exudate.  Eyes:     Pupils: Pupils are equal, round, and reactive to light.  Neck:     Thyroid: No thyromegaly.      Vascular: No JVD.     Trachea: No tracheal deviation.     Comments: Slightly decreased ROM  Cardiovascular:     Rate and Rhythm: Normal rate and regular rhythm.     Heart sounds: Normal heart sounds. No murmur heard.  No friction rub. No gallop.   Pulmonary:     Effort: Pulmonary effort is normal. No respiratory distress.     Breath sounds: No wheezing or rales.  Chest:     Chest wall: No tenderness.     Breasts:        Right: No mass.        Left: Normal. No mass.  Abdominal:     General: Bowel sounds are normal.     Palpations: Abdomen is soft.  Musculoskeletal:        General: Normal range of motion.     Cervical back: No rigidity or tenderness.  Lymphadenopathy:     Cervical: No cervical adenopathy.  Skin:    General: Skin is warm and dry.  Neurological:     Mental Status: She is alert and oriented to person, place, and time.     Cranial Nerves: No cranial nerve deficit.  Psychiatric:        Behavior: Behavior normal.        Thought Content: Thought content normal.        Judgment: Judgment normal.    LABS: Recent Results (from the past 2160 hour(s))  Urinalysis, Routine w reflex microscopic     Status: None   Collection Time: 09/06/19 11:17 AM  Result Value Ref Range   Specific Gravity, UA 1.012 1.005 - 1.030   pH, UA 7.0 5.0 - 7.5   Color, UA Yellow Yellow   Appearance Ur Clear Clear   Leukocytes,UA Negative Negative   Protein,UA Negative Negative/Trace   Glucose, UA Negative Negative   Ketones, UA Negative Negative   RBC, UA Negative Negative   Bilirubin, UA Negative Negative   Urobilinogen, Ur 0.2 0.2 - 1.0 mg/dL   Nitrite, UA Negative Negative   Microscopic Examination Comment     Comment: Microscopic not indicated and not performed.  B12     Status: Abnormal   Collection Time: 09/07/19  8:53 AM  Result Value Ref Range   Vitamin B-12 170 (L) 232 - 1,245 pg/mL  Methylmalonic Acid     Status: None   Collection Time: 09/07/19  8:53 AM  Result Value Ref  Range   Methylmalonic Acid 99 0 - 378 nmol/L   Disclaimer: Comment     Comment: This test was developed and its performance characteristics determined by [facility]. It has not been cleared or approved by the Food and Drug Administration.   CBC with Differential/Platelet     Status: None   Collection Time: 09/07/19  8:54 AM  Result Value Ref Range   WBC 3.7 3.4 - 10.8 x10E3/uL   RBC 4.24  3.77 - 5.28 x10E6/uL   Hemoglobin 13.5 11.1 - 15.9 g/dL   Hematocrit 40.2 34.0 - 46.6 %   MCV 95 79 - 97 fL   MCH 31.8 26.6 - 33.0 pg   MCHC 33.6 31 - 35 g/dL   RDW 12.6 11.7 - 15.4 %   Platelets 199 150 - 450 x10E3/uL   Neutrophils 39 Not Estab. %   Lymphs 50 Not Estab. %   Monocytes 7 Not Estab. %   Eos 3 Not Estab. %   Basos 1 Not Estab. %   Neutrophils Absolute 1.4 1 - 7 x10E3/uL   Lymphocytes Absolute 1.9 0 - 3 x10E3/uL   Monocytes Absolute 0.2 0 - 0 x10E3/uL   EOS (ABSOLUTE) 0.1 0.0 - 0.4 x10E3/uL   Basophils Absolute 0.0 0 - 0 x10E3/uL   Immature Granulocytes 0 Not Estab. %   Immature Grans (Abs) 0.0 0.0 - 0.1 x10E3/uL  Lipid Panel With LDL/HDL Ratio     Status: Abnormal   Collection Time: 09/07/19  8:54 AM  Result Value Ref Range   Cholesterol, Total 274 (H) 100 - 199 mg/dL   Triglycerides 87 0 - 149 mg/dL   HDL 96 >39 mg/dL   VLDL Cholesterol Cal 14 5 - 40 mg/dL   LDL Chol Calc (NIH) 164 (H) 0 - 99 mg/dL   LDL/HDL Ratio 1.7 0.0 - 3.2 ratio    Comment:                                     LDL/HDL Ratio                                             Men  Women                               1/2 Avg.Risk  1.0    1.5                                   Avg.Risk  3.6    3.2                                2X Avg.Risk  6.2    5.0                                3X Avg.Risk  8.0    6.1   TSH     Status: None   Collection Time: 09/07/19  8:54 AM  Result Value Ref Range   TSH 2.970 0.450 - 4.500 uIU/mL  T4, free     Status: None   Collection Time: 09/07/19  8:54 AM  Result Value Ref Range    Free T4 1.11 0.82 - 1.77 ng/dL  Comprehensive metabolic panel     Status: None   Collection Time: 09/07/19  8:54 AM  Result Value Ref Range   Glucose 87 65 - 99 mg/dL   BUN 10 8 - 27 mg/dL   Creatinine, Ser 0.62 0.57 - 1.00 mg/dL   GFR calc non Af  Amer 93 >59 mL/min/1.73   GFR calc Af Amer 107 >59 mL/min/1.73    Comment: **[facility] currently reports eGFR in compliance with the current**   recommendations of the [employer]. [facility] will   update reporting as new guidelines are published from the NKF-ASN   Task force.    BUN/Creatinine Ratio 16 12 - 28   Sodium 143 134 - 144 mmol/L   Potassium 4.1 3.5 - 5.2 mmol/L   Chloride 103 96 - 106 mmol/L   CO2 27 20 - 29 mmol/L   Calcium 9.3 8.7 - 10.3 mg/dL   Total Protein 6.6 6.0 - 8.5 g/dL   Albumin 4.3 3.8 - 4.8 g/dL   Globulin, Total 2.3 1.5 - 4.5 g/dL   Albumin/Globulin Ratio 1.9 1.2 - 2.2   Bilirubin Total 0.6 0.0 - 1.2 mg/dL   Alkaline Phosphatase 52 48 - 121 IU/L   AST 18 0 - 40 IU/L   ALT 12 0 - 32 IU/L   Assessment/Plan: 1. Encounter for general adult medical examination with abnormal findings - Update vaccine, colonoscopy  - Comprehensive metabolic panel  2. Encounter for mammogram to establish baseline mammogram - mammogram ordered for september   3. Mixed hyperlipidemia - start therapy  - Lipid Panel With LDL/HDL Ratio - TSH - T4, free - rosuvastatin (CRESTOR) 5 MG tablet; Take 1 tablet (5 mg total) by mouth daily.  Dispense: 90 tablet; Refill: 3  4. Need for pneumococcal vaccination - Pneumococcal polysaccharide vaccine 23-valent greater than or equal to 2yo subcutaneous/IM  5. Dysuria - Urinalysis, Routine w reflex microscopic - Urinalysis  6. B12 deficiency - pt has low B12 levels in the past  - CBC with Differential/Platelet - B12 - Methylmalonic Acid  7. Tinnitus of both ears - Ambulatory referral to ENT General Counseling: Alben Spittle understanding of the findings of todays  visit and agrees with plan of treatment. I have discussed any further diagnostic evaluation that may be needed or ordered today. We also reviewed her medications today. she has been encouraged to call the office with any questions or concerns that should arise related to todays visit.  Counseling: Cardiac risk factor modification:  1. Control blood pressure. 2. Exercise as prescribed. 3. Follow low sodium, low fat diet. and low fat and low cholestrol diet. 4. Take ASA 32m once a day. 5. Restricted calories diet to lose weight.   Orders Placed This Encounter  Procedures  . Pneumococcal polysaccharide vaccine 23-valent greater than or equal to 2yo subcutaneous/IM  . Urinalysis, Routine w reflex microscopic  . CBC with Differential/Platelet  . Lipid Panel With LDL/HDL Ratio  . TSH  . T4, free  . Comprehensive metabolic panel  . Urinalysis  . B12  . Methylmalonic Acid  . Ambulatory referral to ENT    Meds ordered this encounter  Medications  . rosuvastatin (CRESTOR) 5 MG tablet    Sig: Take 1 tablet (5 mg total) by mouth daily.    Dispense:  90 tablet    Refill:  3    Total time spent:* 35 Minutes  Time spent includes review of chart, medications, test results, and follow up plan with the patient.    FLavera Guise MD  Internal Medicine

## 2019-09-07 ENCOUNTER — Ambulatory Visit: Payer: Medicare Other | Admitting: Internal Medicine

## 2019-09-07 DIAGNOSIS — E538 Deficiency of other specified B group vitamins: Secondary | ICD-10-CM | POA: Diagnosis not present

## 2019-09-07 DIAGNOSIS — Z0001 Encounter for general adult medical examination with abnormal findings: Secondary | ICD-10-CM | POA: Diagnosis not present

## 2019-09-07 DIAGNOSIS — R300 Dysuria: Secondary | ICD-10-CM | POA: Diagnosis not present

## 2019-09-07 DIAGNOSIS — Z1231 Encounter for screening mammogram for malignant neoplasm of breast: Secondary | ICD-10-CM | POA: Diagnosis not present

## 2019-09-07 DIAGNOSIS — E782 Mixed hyperlipidemia: Secondary | ICD-10-CM | POA: Diagnosis not present

## 2019-09-07 DIAGNOSIS — I83813 Varicose veins of bilateral lower extremities with pain: Secondary | ICD-10-CM | POA: Diagnosis not present

## 2019-09-07 LAB — URINALYSIS, ROUTINE W REFLEX MICROSCOPIC
Bilirubin, UA: NEGATIVE
Glucose, UA: NEGATIVE
Ketones, UA: NEGATIVE
Leukocytes,UA: NEGATIVE
Nitrite, UA: NEGATIVE
Protein,UA: NEGATIVE
RBC, UA: NEGATIVE
Specific Gravity, UA: 1.012 (ref 1.005–1.030)
Urobilinogen, Ur: 0.2 mg/dL (ref 0.2–1.0)
pH, UA: 7 (ref 5.0–7.5)

## 2019-09-08 ENCOUNTER — Other Ambulatory Visit: Payer: Self-pay | Admitting: Internal Medicine

## 2019-09-08 DIAGNOSIS — Z1231 Encounter for screening mammogram for malignant neoplasm of breast: Secondary | ICD-10-CM

## 2019-09-08 LAB — LIPID PANEL WITH LDL/HDL RATIO
Cholesterol, Total: 274 mg/dL — ABNORMAL HIGH (ref 100–199)
HDL: 96 mg/dL (ref >39)
LDL Chol Calc (NIH): 164 mg/dL — ABNORMAL HIGH (ref 0–99)
LDL/HDL Ratio: 1.7 ratio (ref 0.0–3.2)
Triglycerides: 87 mg/dL (ref 0–149)
VLDL Cholesterol Cal: 14 mg/dL (ref 5–40)

## 2019-09-08 LAB — CBC WITH DIFFERENTIAL/PLATELET
Basophils Absolute: 0 10*3/uL (ref 0.0–0.2)
Basos: 1 %
EOS (ABSOLUTE): 0.1 10*3/uL (ref 0.0–0.4)
Eos: 3 %
Hematocrit: 40.2 % (ref 34.0–46.6)
Hemoglobin: 13.5 g/dL (ref 11.1–15.9)
Immature Grans (Abs): 0 10*3/uL (ref 0.0–0.1)
Immature Granulocytes: 0 %
Lymphocytes Absolute: 1.9 10*3/uL (ref 0.7–3.1)
Lymphs: 50 %
MCH: 31.8 pg (ref 26.6–33.0)
MCHC: 33.6 g/dL (ref 31.5–35.7)
MCV: 95 fL (ref 79–97)
Monocytes Absolute: 0.2 10*3/uL (ref 0.1–0.9)
Monocytes: 7 %
Neutrophils Absolute: 1.4 10*3/uL (ref 1.4–7.0)
Neutrophils: 39 %
Platelets: 199 10*3/uL (ref 150–450)
RBC: 4.24 x10E6/uL (ref 3.77–5.28)
RDW: 12.6 % (ref 11.7–15.4)
WBC: 3.7 10*3/uL (ref 3.4–10.8)

## 2019-09-08 LAB — COMPREHENSIVE METABOLIC PANEL
ALT: 12 IU/L (ref 0–32)
AST: 18 IU/L (ref 0–40)
Albumin/Globulin Ratio: 1.9 (ref 1.2–2.2)
Albumin: 4.3 g/dL (ref 3.8–4.8)
Alkaline Phosphatase: 52 IU/L (ref 48–121)
BUN/Creatinine Ratio: 16 (ref 12–28)
BUN: 10 mg/dL (ref 8–27)
Bilirubin Total: 0.6 mg/dL (ref 0.0–1.2)
CO2: 27 mmol/L (ref 20–29)
Calcium: 9.3 mg/dL (ref 8.7–10.3)
Chloride: 103 mmol/L (ref 96–106)
Creatinine, Ser: 0.62 mg/dL (ref 0.57–1.00)
GFR calc Af Amer: 107 mL/min/{1.73_m2} (ref >59)
GFR calc non Af Amer: 93 mL/min/{1.73_m2} (ref >59)
Globulin, Total: 2.3 g/dL (ref 1.5–4.5)
Glucose: 87 mg/dL (ref 65–99)
Potassium: 4.1 mmol/L (ref 3.5–5.2)
Sodium: 143 mmol/L (ref 134–144)
Total Protein: 6.6 g/dL (ref 6.0–8.5)

## 2019-09-08 LAB — T4, FREE: Free T4: 1.11 ng/dL (ref 0.82–1.77)

## 2019-09-08 LAB — TSH: TSH: 2.97 u[IU]/mL (ref 0.450–4.500)

## 2019-09-10 LAB — VITAMIN B12: Vitamin B-12: 170 pg/mL — ABNORMAL LOW (ref 232–1245)

## 2019-09-10 LAB — METHYLMALONIC ACID, SERUM: Methylmalonic Acid: 99 nmol/L (ref 0–378)

## 2019-09-14 ENCOUNTER — Encounter: Payer: Self-pay | Admitting: Internal Medicine

## 2019-09-14 DIAGNOSIS — E538 Deficiency of other specified B group vitamins: Secondary | ICD-10-CM | POA: Diagnosis not present

## 2019-09-14 DIAGNOSIS — Z23 Encounter for immunization: Secondary | ICD-10-CM | POA: Diagnosis not present

## 2019-09-14 DIAGNOSIS — Z0001 Encounter for general adult medical examination with abnormal findings: Secondary | ICD-10-CM | POA: Diagnosis not present

## 2019-09-14 DIAGNOSIS — Z1231 Encounter for screening mammogram for malignant neoplasm of breast: Secondary | ICD-10-CM | POA: Diagnosis not present

## 2019-09-14 DIAGNOSIS — R300 Dysuria: Secondary | ICD-10-CM | POA: Diagnosis not present

## 2019-09-14 DIAGNOSIS — E782 Mixed hyperlipidemia: Secondary | ICD-10-CM | POA: Diagnosis not present

## 2019-09-14 DIAGNOSIS — H9313 Tinnitus, bilateral: Secondary | ICD-10-CM | POA: Diagnosis not present

## 2019-09-14 NOTE — Telephone Encounter (Signed)
Please see and update her chart

## 2019-09-25 ENCOUNTER — Ambulatory Visit
Admission: RE | Admit: 2019-09-25 | Discharge: 2019-09-25 | Disposition: A | Discharge status: Home / Self Care | Payer: Medicare PPO | Source: Ambulatory Visit | Attending: Internal Medicine | Admitting: Internal Medicine

## 2019-09-25 DIAGNOSIS — Z1231 Encounter for screening mammogram for malignant neoplasm of breast: Secondary | ICD-10-CM | POA: Diagnosis not present

## 2019-09-25 DIAGNOSIS — Z8249 Family history of ischemic heart disease and other diseases of the circulatory system: Secondary | ICD-10-CM | POA: Diagnosis not present

## 2019-09-25 DIAGNOSIS — H04129 Dry eye syndrome of unspecified lacrimal gland: Secondary | ICD-10-CM | POA: Diagnosis not present

## 2019-09-25 DIAGNOSIS — Z833 Family history of diabetes mellitus: Secondary | ICD-10-CM | POA: Diagnosis not present

## 2019-09-25 DIAGNOSIS — L659 Nonscarring hair loss, unspecified: Secondary | ICD-10-CM | POA: Diagnosis not present

## 2019-09-25 DIAGNOSIS — K219 Gastro-esophageal reflux disease without esophagitis: Secondary | ICD-10-CM | POA: Diagnosis not present

## 2019-09-25 DIAGNOSIS — R030 Elevated blood-pressure reading, without diagnosis of hypertension: Secondary | ICD-10-CM | POA: Diagnosis not present

## 2019-09-25 DIAGNOSIS — Z823 Family history of stroke: Secondary | ICD-10-CM | POA: Diagnosis not present

## 2019-09-25 DIAGNOSIS — I739 Peripheral vascular disease, unspecified: Secondary | ICD-10-CM | POA: Diagnosis not present

## 2019-09-25 DIAGNOSIS — E785 Hyperlipidemia, unspecified: Secondary | ICD-10-CM | POA: Diagnosis not present

## 2019-09-28 ENCOUNTER — Other Ambulatory Visit: Payer: Self-pay | Admitting: Internal Medicine

## 2019-09-28 DIAGNOSIS — J3489 Other specified disorders of nose and nasal sinuses: Secondary | ICD-10-CM | POA: Diagnosis not present

## 2019-09-28 DIAGNOSIS — H9319 Tinnitus, unspecified ear: Secondary | ICD-10-CM | POA: Diagnosis not present

## 2019-09-28 DIAGNOSIS — H9313 Tinnitus, bilateral: Secondary | ICD-10-CM | POA: Diagnosis not present

## 2019-09-28 DIAGNOSIS — N6310 Unspecified lump in the right breast, unspecified quadrant: Secondary | ICD-10-CM

## 2019-09-28 DIAGNOSIS — R928 Other abnormal and inconclusive findings on diagnostic imaging of breast: Secondary | ICD-10-CM

## 2019-09-28 DIAGNOSIS — H903 Sensorineural hearing loss, bilateral: Secondary | ICD-10-CM | POA: Diagnosis not present

## 2019-10-03 ENCOUNTER — Telehealth: Payer: Self-pay

## 2019-10-03 NOTE — Telephone Encounter (Signed)
Lmom for patient to reschedule 10-05-19 ov due to change in provider schedule.

## 2019-10-06 ENCOUNTER — Telehealth: Payer: Self-pay

## 2019-10-06 ENCOUNTER — Ambulatory Visit: Payer: Medicare PPO | Admitting: Internal Medicine

## 2019-10-06 NOTE — Telephone Encounter (Signed)
Confirmed and screened for 10-10-19 ov. 

## 2019-10-10 ENCOUNTER — Other Ambulatory Visit: Payer: Self-pay

## 2019-10-10 ENCOUNTER — Ambulatory Visit: Payer: Medicare PPO | Admitting: Internal Medicine

## 2019-10-10 ENCOUNTER — Ambulatory Visit
Admission: RE | Admit: 2019-10-10 | Discharge: 2019-10-10 | Disposition: A | Discharge status: Home / Self Care | Payer: Medicare PPO | Source: Ambulatory Visit | Attending: Internal Medicine | Admitting: Internal Medicine

## 2019-10-10 ENCOUNTER — Encounter: Payer: Self-pay | Admitting: Internal Medicine

## 2019-10-10 VITALS — BP 153/84 | HR 65 | Temp 97.6°F | Resp 16 | Ht 62.5 in | Wt 140.8 lb

## 2019-10-10 DIAGNOSIS — R928 Other abnormal and inconclusive findings on diagnostic imaging of breast: Secondary | ICD-10-CM

## 2019-10-10 DIAGNOSIS — E782 Mixed hyperlipidemia: Secondary | ICD-10-CM | POA: Diagnosis not present

## 2019-10-10 DIAGNOSIS — N6310 Unspecified lump in the right breast, unspecified quadrant: Secondary | ICD-10-CM

## 2019-10-10 DIAGNOSIS — E538 Deficiency of other specified B group vitamins: Secondary | ICD-10-CM | POA: Diagnosis not present

## 2019-10-10 DIAGNOSIS — N6011 Diffuse cystic mastopathy of right breast: Secondary | ICD-10-CM | POA: Diagnosis not present

## 2019-10-10 DIAGNOSIS — R922 Inconclusive mammogram: Secondary | ICD-10-CM | POA: Diagnosis not present

## 2019-10-10 MED ORDER — CYANOCOBALAMIN 1000 MCG/ML IJ SOLN
1000.0000 ug | Freq: Once | INTRAMUSCULAR | Status: AC
  Administered 2019-10-10: 1000 ug via INTRAMUSCULAR

## 2019-10-10 NOTE — Progress Notes (Signed)
[facility] [place], [place] [address]  Internal MEDICINE  Office Visit Note  Patient Name: Kristen Reid  [id number]  [id number]  Date of Service: 10/17/2019  Chief Complaint  Patient presents with  . Follow-up    Follow up on labwork and discuss Crestor side affects  . Hyperlipidemia  . Quality Metric Gaps    Influenza Vaccine     HPI Pt is here for follow up  1. Abnormal labs B12 is low. Will need replacement  2. Abnormal lipid profile, she is on Crestor.  3. Abnormal mammogram, will need u/s and biopsy   Current Medication: Outpatient Encounter Medications as of 10/10/2019  Medication Sig  . Apple Cider Vinegar 300 MG TABS Take by mouth.  . calcium carbonate (CALCIUM 600) 600 MG TABS tablet Take 600 mg by mouth 2 (two) times daily with a meal.  . Cholecalciferol (VITAMIN D3) 2000 units TABS Take by mouth.  . clobetasol (TEMOVATE) 0.05 % external solution APPLY TO AFFECTED AREA EVERY DAY AT BEDTIME  . Coenzyme Q10 (COQ10) 200 MG CAPS Take by mouth.  . Cyanocobalamin 1000 MCG SUBL Place under the tongue.  . dutasteride (AVODART) 0.5 MG capsule TAKE ONE PILL DAILY X 1 WEEK, THEN ONE PILL ONCE WEEKLY  . famotidine (PEPCID) 20 MG tablet   . Flaxseed, Linseed, (FLAX SEED OIL) 1300 MG CAPS Take by mouth.  Marland Kitchen Lifitegrast (XIIDRA) 5 % SOLN Apply to eye.  . loratadine (CLARITIN) 10 MG tablet Take by mouth.  Marland Kitchen MILK THISTLE PO Take by mouth.  . rosuvastatin (CRESTOR) 5 MG tablet Take 1 tablet (5 mg total) by mouth daily.  . [DISCONTINUED] Cholecalciferol (VITAMIN D3) 50 MCG (2000 UT) capsule Take by mouth.  . [DISCONTINUED] dutasteride (AVODART) 0.5 MG capsule Take 0.5 mg by mouth daily.  . [EXPIRED] cyanocobalamin ((VITAMIN B-12)) injection 1,000 mcg    No facility-administered encounter medications on file as of 10/10/2019.    Surgical History: Past Surgical History:  Procedure Laterality Date  . BREAST BIOPSY Right 2010 and 04/18/2015  . EYE  SURGERY Right    keratectomy     Medical History: Past Medical History:  Diagnosis Date  . Cataract   . Hyperlipidemia     Family History: Family History  Problem Relation Age of Onset  . Heart disease Mother   . Stroke Father   . Breast cancer Neg Hx     Social History   Socioeconomic History  . Marital status: Married    Spouse name: Not on file  . Number of children: Not on file  . Years of education: Not on file  . Highest education level: Not on file  Occupational History  . Not on file  Tobacco Use  . Smoking status: Never Smoker  . Smokeless tobacco: Never Used  Vaping Use  . Vaping Use: Never used  Substance and Sexual Activity  . Alcohol use: Yes    Comment: occasionally   . Drug use: Never  . Sexual activity: Not on file  Other Topics Concern  . Not on file  Social History Narrative  . Not on file   Social Determinants of Health   Financial Resource Strain:   . Difficulty of Paying Living Expenses:   Food Insecurity:   . Worried About Charity fundraiser in the Last Year:   . Arboriculturist in the Last Year:   Transportation Needs:   . Film/video editor (Medical):   Marland Kitchen Lack  of Transportation (Non-Medical):   Physical Activity:   . Days of Exercise per Week:   . Minutes of Exercise per Session:   Stress:   . Feeling of Stress :   Social Connections:   . Frequency of Communication with Friends and Family:   . Frequency of Social Gatherings with Friends and Family:   . Attends Religious Services:   . Active Member of Clubs or Organizations:   . Attends Archivist Meetings:   Marland Kitchen Marital Status:   Intimate Partner Violence:   . Fear of Current or Ex-Partner:   . Emotionally Abused:   Marland Kitchen Physically Abused:   . Sexually Abused:       Review of Systems  Constitutional: Negative for chills, diaphoresis and fatigue.  HENT: Negative for ear pain, postnasal drip and sinus pressure.   Eyes: Negative for photophobia, discharge,  redness, itching and visual disturbance.  Respiratory: Negative for cough, shortness of breath and wheezing.   Cardiovascular: Negative for chest pain, palpitations and leg swelling.  Gastrointestinal: Negative for abdominal pain, constipation, diarrhea, nausea and vomiting.  Genitourinary: Negative for dysuria and flank pain.  Musculoskeletal: Negative for arthralgias, back pain, gait problem and neck pain.  Skin: Negative for color change.  Allergic/Immunologic: Negative for environmental allergies and food allergies.  Neurological: Negative for dizziness and headaches.  Hematological: Does not bruise/bleed easily.  Psychiatric/Behavioral: Negative for agitation, behavioral problems (depression) and hallucinations.    Vital Signs: BP (!) 153/84   Pulse 65   Temp 97.6 F (36.4 C)   Resp 16   Ht 5' 2.5" (1.588 m)   Wt 140 lb 12.8 oz (63.9 kg)   SpO2 99%   BMI 25.34 kg/m    Physical Exam Constitutional:      Appearance: Normal appearance.  Cardiovascular:     Rate and Rhythm: Normal rate.     Pulses: Normal pulses.  Pulmonary:     Effort: Pulmonary effort is normal.     Breath sounds: Normal breath sounds.  Neurological:     General: No focal deficit present.     Mental Status: She is oriented to person, place, and time.  Psychiatric:        Mood and Affect: Mood normal.        Behavior: Behavior normal.     Assessment/Plan: 1. B12 deficiency - start B12 Qm inj - cyanocobalamin ((VITAMIN B-12)) injection 1,000 mcg  2. Mixed hyperlipidemia - Continue Crestor   3. Abnormal mammogram - pt is scheduled to have u/s and possible biopsy   General Counseling: Alben Spittle understanding of the findings of todays visit and agrees with plan of treatment. I have discussed any further diagnostic evaluation that may be needed or ordered today. We also reviewed her medications today. she has been encouraged to call the office with any questions or concerns that should  arise related to todays visit.   Meds ordered this encounter  Medications  . cyanocobalamin ((VITAMIN B-12)) injection 1,000 mcg    Total time spent:30 Minutes Time spent includes review of chart, medications, test results, and follow up plan with the patient.      Dr Lavera Guise Internal medicine

## 2019-10-11 ENCOUNTER — Other Ambulatory Visit: Payer: Self-pay | Admitting: Internal Medicine

## 2019-10-11 DIAGNOSIS — N6310 Unspecified lump in the right breast, unspecified quadrant: Secondary | ICD-10-CM

## 2019-10-11 DIAGNOSIS — R928 Other abnormal and inconclusive findings on diagnostic imaging of breast: Secondary | ICD-10-CM

## 2019-10-17 NOTE — Progress Notes (Signed)
Pt needs biopsy

## 2019-10-20 ENCOUNTER — Other Ambulatory Visit: Payer: Self-pay

## 2019-10-20 ENCOUNTER — Ambulatory Visit (INDEPENDENT_AMBULATORY_CARE_PROVIDER_SITE_OTHER): Payer: Medicare PPO

## 2019-10-20 DIAGNOSIS — E538 Deficiency of other specified B group vitamins: Secondary | ICD-10-CM

## 2019-10-20 MED ORDER — CYANOCOBALAMIN 1000 MCG/ML IJ SOLN
1000.0000 ug | Freq: Once | INTRAMUSCULAR | Status: AC
  Administered 2019-10-20: 1000 ug via INTRAMUSCULAR

## 2019-10-26 ENCOUNTER — Other Ambulatory Visit: Payer: Self-pay

## 2019-10-26 ENCOUNTER — Ambulatory Visit
Admission: RE | Admit: 2019-10-26 | Discharge: 2019-10-26 | Disposition: A | Discharge status: Home / Self Care | Payer: Medicare PPO | Source: Ambulatory Visit | Attending: Internal Medicine | Admitting: Internal Medicine

## 2019-10-26 DIAGNOSIS — R928 Other abnormal and inconclusive findings on diagnostic imaging of breast: Secondary | ICD-10-CM

## 2019-10-26 DIAGNOSIS — N6312 Unspecified lump in the right breast, upper inner quadrant: Secondary | ICD-10-CM | POA: Diagnosis not present

## 2019-10-26 DIAGNOSIS — N6081 Other benign mammary dysplasias of right breast: Secondary | ICD-10-CM | POA: Diagnosis not present

## 2019-10-26 DIAGNOSIS — N6310 Unspecified lump in the right breast, unspecified quadrant: Secondary | ICD-10-CM | POA: Diagnosis not present

## 2019-10-26 HISTORY — PX: BREAST BIOPSY: SHX20

## 2019-10-27 ENCOUNTER — Ambulatory Visit (INDEPENDENT_AMBULATORY_CARE_PROVIDER_SITE_OTHER): Payer: Medicare PPO

## 2019-10-27 DIAGNOSIS — E538 Deficiency of other specified B group vitamins: Secondary | ICD-10-CM

## 2019-10-27 LAB — SURGICAL PATHOLOGY

## 2019-10-27 MED ORDER — CYANOCOBALAMIN 1000 MCG/ML IJ SOLN
1000.0000 ug | Freq: Once | INTRAMUSCULAR | Status: AC
  Administered 2019-10-27: 1000 ug via INTRAMUSCULAR

## 2019-10-30 NOTE — Progress Notes (Signed)
Notified by Electa Sniff at [facility] that patient has received biopsy results. Left message for patient per radiology recommendation to schedule surgical consult.

## 2019-11-28 DIAGNOSIS — J301 Allergic rhinitis due to pollen: Secondary | ICD-10-CM | POA: Diagnosis not present

## 2019-11-29 ENCOUNTER — Other Ambulatory Visit: Payer: Self-pay

## 2019-11-29 ENCOUNTER — Ambulatory Visit (INDEPENDENT_AMBULATORY_CARE_PROVIDER_SITE_OTHER): Payer: Medicare PPO

## 2019-11-29 DIAGNOSIS — E538 Deficiency of other specified B group vitamins: Secondary | ICD-10-CM | POA: Diagnosis not present

## 2019-11-29 MED ORDER — CYANOCOBALAMIN 1000 MCG/ML IJ SOLN
1000.0000 ug | Freq: Once | INTRAMUSCULAR | Status: AC
  Administered 2019-11-29: 1000 ug via INTRAMUSCULAR

## 2019-11-30 ENCOUNTER — Ambulatory Visit: Payer: Self-pay | Admitting: General Surgery

## 2019-11-30 DIAGNOSIS — N6310 Unspecified lump in the right breast, unspecified quadrant: Secondary | ICD-10-CM

## 2019-12-22 DIAGNOSIS — L668 Other cicatricial alopecia: Secondary | ICD-10-CM | POA: Diagnosis not present

## 2019-12-22 DIAGNOSIS — D2261 Melanocytic nevi of right upper limb, including shoulder: Secondary | ICD-10-CM | POA: Diagnosis not present

## 2019-12-22 DIAGNOSIS — D2262 Melanocytic nevi of left upper limb, including shoulder: Secondary | ICD-10-CM | POA: Diagnosis not present

## 2019-12-22 DIAGNOSIS — D2272 Melanocytic nevi of left lower limb, including hip: Secondary | ICD-10-CM | POA: Diagnosis not present

## 2019-12-22 DIAGNOSIS — D2271 Melanocytic nevi of right lower limb, including hip: Secondary | ICD-10-CM | POA: Diagnosis not present

## 2019-12-22 DIAGNOSIS — D225 Melanocytic nevi of trunk: Secondary | ICD-10-CM | POA: Diagnosis not present

## 2019-12-22 DIAGNOSIS — L821 Other seborrheic keratosis: Secondary | ICD-10-CM | POA: Diagnosis not present

## 2019-12-27 ENCOUNTER — Ambulatory Visit (INDEPENDENT_AMBULATORY_CARE_PROVIDER_SITE_OTHER): Payer: Medicare PPO

## 2019-12-27 ENCOUNTER — Other Ambulatory Visit: Payer: Self-pay

## 2019-12-27 DIAGNOSIS — E538 Deficiency of other specified B group vitamins: Secondary | ICD-10-CM | POA: Diagnosis not present

## 2019-12-27 MED ORDER — CYANOCOBALAMIN 1000 MCG/ML IJ SOLN
1000.0000 ug | Freq: Once | INTRAMUSCULAR | Status: AC
  Administered 2019-12-27: 1000 ug via INTRAMUSCULAR

## 2020-01-17 DIAGNOSIS — J309 Allergic rhinitis, unspecified: Secondary | ICD-10-CM | POA: Diagnosis not present

## 2020-01-24 ENCOUNTER — Ambulatory Visit (INDEPENDENT_AMBULATORY_CARE_PROVIDER_SITE_OTHER): Payer: Medicare PPO

## 2020-01-24 DIAGNOSIS — E538 Deficiency of other specified B group vitamins: Secondary | ICD-10-CM

## 2020-01-24 MED ORDER — CYANOCOBALAMIN 1000 MCG/ML IJ SOLN
1000.0000 ug | Freq: Once | INTRAMUSCULAR | Status: AC
  Administered 2020-01-24: 1000 ug via INTRAMUSCULAR

## 2020-01-26 ENCOUNTER — Ambulatory Visit: Payer: Medicare PPO

## 2020-02-21 ENCOUNTER — Ambulatory Visit (INDEPENDENT_AMBULATORY_CARE_PROVIDER_SITE_OTHER): Payer: Medicare PPO

## 2020-02-21 ENCOUNTER — Other Ambulatory Visit: Payer: Self-pay

## 2020-02-21 DIAGNOSIS — E538 Deficiency of other specified B group vitamins: Secondary | ICD-10-CM

## 2020-02-21 MED ORDER — CYANOCOBALAMIN 1000 MCG/ML IJ SOLN
1000.0000 ug | Freq: Once | INTRAMUSCULAR | Status: AC
  Administered 2020-02-21: 11:00:00 1000 ug via INTRAMUSCULAR

## 2020-02-27 ENCOUNTER — Ambulatory Visit: Payer: Medicare PPO

## 2020-02-27 ENCOUNTER — Ambulatory Visit: Payer: Medicare PPO | Admitting: Internal Medicine

## 2020-03-12 ENCOUNTER — Other Ambulatory Visit: Payer: Self-pay

## 2020-03-12 ENCOUNTER — Ambulatory Visit: Payer: Medicare PPO | Admitting: Internal Medicine

## 2020-03-12 ENCOUNTER — Encounter: Payer: Self-pay | Admitting: Internal Medicine

## 2020-03-12 VITALS — BP 130/80 | HR 55 | Temp 97.4°F | Resp 16 | Ht 62.5 in | Wt 144.8 lb

## 2020-03-12 DIAGNOSIS — E782 Mixed hyperlipidemia: Secondary | ICD-10-CM

## 2020-03-12 DIAGNOSIS — E538 Deficiency of other specified B group vitamins: Secondary | ICD-10-CM

## 2020-03-12 DIAGNOSIS — J301 Allergic rhinitis due to pollen: Secondary | ICD-10-CM | POA: Diagnosis not present

## 2020-03-12 DIAGNOSIS — K219 Gastro-esophageal reflux disease without esophagitis: Secondary | ICD-10-CM

## 2020-03-12 DIAGNOSIS — R5383 Other fatigue: Secondary | ICD-10-CM | POA: Diagnosis not present

## 2020-03-12 MED ORDER — FAMOTIDINE 20 MG PO TABS: 20.0000 mg | ORAL_TABLET | Freq: Every day | ORAL | 1 refills | Status: DC

## 2020-03-12 NOTE — Progress Notes (Signed)
[facility] [place], [place] [address]  Internal MEDICINE  Office Visit Note  Patient Name: Kristen Reid  [id number]  [id number]  Date of Service: 03/15/2020  Chief Complaint  Patient presents with  . Follow-up    Refill request  . Hyperlipidemia    HPI Pt is here for routine follow up, c/o being fatigued, has been under stress due to husband's health. Working full time, [employer] 5 mg 2 x a week. Sleeps well, denies any chest pain or sob ,     Current Medication: Outpatient Encounter Medications as of 03/12/2020  Medication Sig  . Apple Cider Vinegar 300 MG TABS Take by mouth.  . calcium carbonate (OS-CAL) 600 MG TABS tablet Take 600 mg by mouth 2 (two) times daily with a meal.  . cetirizine (ZYRTEC) 10 MG tablet Take 10 mg by mouth daily.  . Cholecalciferol (VITAMIN D3) 2000 units TABS Take by mouth.  . clobetasol (TEMOVATE) 0.05 % external solution APPLY TO AFFECTED AREA EVERY DAY AT BEDTIME  . Coenzyme Q10 (COQ10) 200 MG CAPS Take by mouth.  . dutasteride (AVODART) 0.5 MG capsule TAKE ONE PILL DAILY X 1 WEEK, THEN ONE PILL ONCE WEEKLY  . Flaxseed, Linseed, (FLAX SEED OIL) 1300 MG CAPS Take by mouth.  . flunisolide (NASALIDE) 25 MCG/ACT (0.025%) SOLN Place 2 sprays into the nose daily.  . Lifitegrast 5 % SOLN Apply to eye.  Marland Kitchen MILK THISTLE PO Take by mouth.  . rosuvastatin (CRESTOR) 5 MG tablet Take 1 tablet (5 mg total) by mouth daily.  . [DISCONTINUED] famotidine (PEPCID) 20 MG tablet   . famotidine (PEPCID) 20 MG tablet Take 1 tablet (20 mg total) by mouth daily.  . [DISCONTINUED] Cyanocobalamin 1000 MCG SUBL Place under the tongue. (Patient not taking: Reported on 03/12/2020)  . [DISCONTINUED] loratadine (CLARITIN) 10 MG tablet Take by mouth. (Patient not taking: Reported on 03/12/2020)   No facility-administered encounter medications on file as of 03/12/2020.    Surgical History: Past Surgical History:  Procedure Laterality Date   . BREAST BIOPSY Right 2010 and 04/18/2015  . BREAST BIOPSY Right 10/26/2019   x clip path pending mass w/ distortion  . EYE SURGERY Right    keratectomy     Medical History: Past Medical History:  Diagnosis Date  . Cataract   . Hyperlipidemia     Family History: Family History  Problem Relation Age of Onset  . Heart disease Mother   . Stroke Father   . Breast cancer Neg Hx     Social History   Socioeconomic History  . Marital status: Married    Spouse name: Not on file  . Number of children: Not on file  . Years of education: Not on file  . Highest education level: Not on file  Occupational History  . Not on file  Tobacco Use  . Smoking status: Never Smoker  . Smokeless tobacco: Never Used  Vaping Use  . Vaping Use: Never used  Substance and Sexual Activity  . Alcohol use: Yes    Comment: occasionally   . Drug use: Never  . Sexual activity: Not on file  Other Topics Concern  . Not on file  Social History Narrative  . Not on file   Social Determinants of Health   Financial Resource Strain: Not on file  Food Insecurity: Not on file  Transportation Needs: Not on file  Physical Activity: Not on file  Stress: Not on file  Social Connections:  Not on file  Intimate Partner Violence: Not on file      Review of Systems  Constitutional: Positive for fatigue. Negative for chills and diaphoresis.  HENT: Negative for ear pain, postnasal drip and sinus pressure.   Eyes: Negative for photophobia, discharge, redness, itching and visual disturbance.  Respiratory: Negative for cough, shortness of breath and wheezing.   Cardiovascular: Negative for chest pain, palpitations and leg swelling.  Gastrointestinal: Negative for nausea and vomiting.  Genitourinary: Negative for dysuria and flank pain.  Musculoskeletal: Negative for arthralgias, back pain, gait problem and neck pain.  Skin: Negative for color change.  Allergic/Immunologic: Negative for environmental  allergies and food allergies.  Neurological: Negative for dizziness and headaches.  Hematological: Does not bruise/bleed easily.  Psychiatric/Behavioral: Negative for agitation, behavioral problems (depression) and hallucinations.    Vital Signs: BP (!) 166/80   Pulse (!) 55   Temp (!) 97.4 F (36.3 C)   Resp 16   Ht 5' 2.5" (1.588 m)   Wt 144 lb 12.8 oz (65.7 kg)   SpO2 99%   BMI 26.06 kg/m    Physical Exam Constitutional:      Appearance: Normal appearance.  HENT:     Head: Normocephalic and atraumatic.     Mouth/Throat:     Mouth: Mucous membranes are moist.  Eyes:     Extraocular Movements: Extraocular movements intact.     Pupils: Pupils are equal, round, and reactive to light.  Cardiovascular:     Rate and Rhythm: Normal rate and regular rhythm.     Pulses: Normal pulses.     Heart sounds: Normal heart sounds.  Abdominal:     General: Abdomen is flat.  Skin:    General: Skin is warm and dry.  Neurological:     General: No focal deficit present.     Mental Status: She is alert.  Psychiatric:        Mood and Affect: Mood normal.        Assessment/Plan: 1. Gastroesophageal reflux disease without esophagitis Controlled with pepcid, refill  - famotidine (PEPCID) 20 MG tablet; Take 1 tablet (20 mg total) by mouth daily.  Dispense: 90 tablet; Refill: 1  2. Mixed hyperlipidemia Continue Crestor as before  - Lipid Panel With LDL/HDL Ratio  3. B12 deficiency Pt is on IM supplement, will check therapeutic levels  - B12  4. Fatigue, unspecified type Multifactorial, stress related due to work and husband's health condition, discussed taking personal time  time to relax and meditate   5. Non-seasonal allergic rhinitis due to pollen Pt needs refills  cetirizine (ZYRTEC) 10 MG tablet; Take 10 mg by mouth daily.  funisolide (NASALIDE) 25 MCG/ACT (0.025%) SOLN; Place 2 sprays into the nose daily.   General Counseling: [place] understanding of the  findings of todays visit and agrees with plan of treatment. I have discussed any further diagnostic evaluation that may be needed or ordered today. We also reviewed her medications today. she has been encouraged to call the office with any questions or concerns that should arise related to todays visit.   Orders Placed This Encounter  Procedures  . B12  . Lipid Panel With LDL/HDL Ratio    Meds ordered this encounter  Medications  . famotidine (PEPCID) 20 MG tablet    Sig: Take 1 tablet (20 mg total) by mouth daily.    Dispense:  90 tablet    Refill:  1    Total time spent: 30 Minutes Time spent includes  review of chart, medications, test results, and follow up plan with the patient.      Dr Lavera Guise Internal medicine

## 2020-03-15 ENCOUNTER — Encounter: Payer: Self-pay | Admitting: Internal Medicine

## 2020-03-25 DIAGNOSIS — E538 Deficiency of other specified B group vitamins: Secondary | ICD-10-CM | POA: Diagnosis not present

## 2020-03-25 DIAGNOSIS — E782 Mixed hyperlipidemia: Secondary | ICD-10-CM | POA: Diagnosis not present

## 2020-03-26 LAB — LIPID PANEL WITH LDL/HDL RATIO
Cholesterol, Total: 282 mg/dL — ABNORMAL HIGH (ref 100–199)
HDL: 111 mg/dL (ref >39)
LDL Chol Calc (NIH): 161 mg/dL — ABNORMAL HIGH (ref 0–99)
LDL/HDL Ratio: 1.5 ratio (ref 0.0–3.2)
Triglycerides: 67 mg/dL (ref 0–149)
VLDL Cholesterol Cal: 10 mg/dL (ref 5–40)

## 2020-03-26 LAB — VITAMIN B12: Vitamin B-12: 471 pg/mL (ref 232–1245)

## 2020-03-27 ENCOUNTER — Ambulatory Visit (INDEPENDENT_AMBULATORY_CARE_PROVIDER_SITE_OTHER): Payer: Medicare PPO

## 2020-03-27 DIAGNOSIS — E538 Deficiency of other specified B group vitamins: Secondary | ICD-10-CM

## 2020-03-27 MED ORDER — CYANOCOBALAMIN 1000 MCG/ML IJ SOLN
1000.0000 ug | Freq: Once | INTRAMUSCULAR | Status: AC
  Administered 2020-03-27: 1000 ug via INTRAMUSCULAR

## 2020-04-24 ENCOUNTER — Ambulatory Visit (INDEPENDENT_AMBULATORY_CARE_PROVIDER_SITE_OTHER): Payer: Medicare PPO

## 2020-04-24 DIAGNOSIS — E538 Deficiency of other specified B group vitamins: Secondary | ICD-10-CM

## 2020-04-24 MED ORDER — CYANOCOBALAMIN 1000 MCG/ML IJ SOLN
1000.0000 ug | Freq: Once | INTRAMUSCULAR | Status: AC
  Administered 2020-04-24: 1000 ug via INTRAMUSCULAR

## 2020-05-22 ENCOUNTER — Ambulatory Visit (INDEPENDENT_AMBULATORY_CARE_PROVIDER_SITE_OTHER): Payer: Medicare PPO

## 2020-05-22 ENCOUNTER — Other Ambulatory Visit: Payer: Self-pay

## 2020-05-22 DIAGNOSIS — E538 Deficiency of other specified B group vitamins: Secondary | ICD-10-CM | POA: Diagnosis not present

## 2020-05-22 MED ORDER — CYANOCOBALAMIN 1000 MCG/ML IJ SOLN
1000.0000 ug | Freq: Once | INTRAMUSCULAR | Status: AC
  Administered 2020-05-22: 1000 ug via INTRAMUSCULAR

## 2020-06-20 ENCOUNTER — Ambulatory Visit (INDEPENDENT_AMBULATORY_CARE_PROVIDER_SITE_OTHER): Payer: Medicare PPO

## 2020-06-20 ENCOUNTER — Other Ambulatory Visit: Payer: Self-pay

## 2020-06-20 DIAGNOSIS — E538 Deficiency of other specified B group vitamins: Secondary | ICD-10-CM

## 2020-06-20 MED ORDER — CYANOCOBALAMIN 1000 MCG/ML IJ SOLN
1000.0000 ug | Freq: Once | INTRAMUSCULAR | Status: AC
  Administered 2020-06-20: 1000 ug via INTRAMUSCULAR

## 2020-07-07 ENCOUNTER — Other Ambulatory Visit: Payer: Self-pay | Admitting: Internal Medicine

## 2020-07-07 DIAGNOSIS — E782 Mixed hyperlipidemia: Secondary | ICD-10-CM

## 2020-07-25 ENCOUNTER — Ambulatory Visit (INDEPENDENT_AMBULATORY_CARE_PROVIDER_SITE_OTHER): Payer: Medicare PPO

## 2020-07-25 ENCOUNTER — Other Ambulatory Visit: Payer: Self-pay

## 2020-07-25 DIAGNOSIS — E538 Deficiency of other specified B group vitamins: Secondary | ICD-10-CM | POA: Diagnosis not present

## 2020-08-01 MED ORDER — CYANOCOBALAMIN 1000 MCG/ML IJ SOLN
1000.0000 ug | Freq: Once | INTRAMUSCULAR | Status: AC
  Administered 2020-07-25: 1000 ug via INTRAMUSCULAR

## 2020-08-22 ENCOUNTER — Other Ambulatory Visit: Payer: Self-pay

## 2020-08-22 ENCOUNTER — Ambulatory Visit (INDEPENDENT_AMBULATORY_CARE_PROVIDER_SITE_OTHER): Payer: Medicare PPO

## 2020-08-22 DIAGNOSIS — E538 Deficiency of other specified B group vitamins: Secondary | ICD-10-CM

## 2020-08-22 MED ORDER — CYANOCOBALAMIN 1000 MCG/ML IJ SOLN
1000.0000 ug | Freq: Once | INTRAMUSCULAR | Status: AC
  Administered 2020-08-22: 1000 ug via INTRAMUSCULAR

## 2020-09-09 ENCOUNTER — Other Ambulatory Visit: Payer: Self-pay

## 2020-09-09 ENCOUNTER — Ambulatory Visit (INDEPENDENT_AMBULATORY_CARE_PROVIDER_SITE_OTHER): Payer: Medicare PPO | Admitting: Internal Medicine

## 2020-09-09 ENCOUNTER — Encounter: Payer: Self-pay | Admitting: Physician Assistant

## 2020-09-09 VITALS — BP 148/90 | HR 65 | Temp 97.5°F | Resp 16 | Ht 62.0 in | Wt 148.2 lb

## 2020-09-09 DIAGNOSIS — I10 Essential (primary) hypertension: Secondary | ICD-10-CM

## 2020-09-09 DIAGNOSIS — Z0001 Encounter for general adult medical examination with abnormal findings: Secondary | ICD-10-CM | POA: Diagnosis not present

## 2020-09-09 DIAGNOSIS — K219 Gastro-esophageal reflux disease without esophagitis: Secondary | ICD-10-CM | POA: Diagnosis not present

## 2020-09-09 DIAGNOSIS — R300 Dysuria: Secondary | ICD-10-CM

## 2020-09-09 DIAGNOSIS — R030 Elevated blood-pressure reading, without diagnosis of hypertension: Secondary | ICD-10-CM

## 2020-09-09 DIAGNOSIS — E2839 Other primary ovarian failure: Secondary | ICD-10-CM | POA: Diagnosis not present

## 2020-09-09 DIAGNOSIS — Z1231 Encounter for screening mammogram for malignant neoplasm of breast: Secondary | ICD-10-CM | POA: Diagnosis not present

## 2020-09-09 DIAGNOSIS — E782 Mixed hyperlipidemia: Secondary | ICD-10-CM | POA: Diagnosis not present

## 2020-09-09 DIAGNOSIS — R928 Other abnormal and inconclusive findings on diagnostic imaging of breast: Secondary | ICD-10-CM | POA: Diagnosis not present

## 2020-09-09 MED ORDER — FAMOTIDINE 20 MG PO TABS: 20.0000 mg | ORAL_TABLET | Freq: Every day | ORAL | 3 refills | Status: DC

## 2020-09-09 MED ORDER — LOSARTAN POTASSIUM 25 MG PO TABS: 25.0000 mg | ORAL_TABLET | Freq: Every day | ORAL | 1 refills | Status: DC

## 2020-09-09 NOTE — Progress Notes (Deleted)
[facility] [place], [place] [address]  Internal MEDICINE  Office Visit Note  Patient Name: Kristen Reid  [id number]  [id number]  Date of Service: 09/09/2020  Chief Complaint  Patient presents with   Medicare Wellness    Tiredness    Hyperlipidemia   Quality Metric Gaps    Shingrix, covid booster     HPI Pt is here for routine health maintenance examination Denies any major complaints, blood pressure is elevated, has gained some wt, busy with work, has been traveling more. She is due for mammogram was abnormal last year, might need diagnostic mammogram Due for BMD as well  GERD is under good control  Current Medication: Outpatient Encounter Medications as of 09/09/2020  Medication Sig   Apple Cider Vinegar 300 MG TABS Take by mouth.   calcium carbonate (OS-CAL) 600 MG TABS tablet Take 600 mg by mouth 2 (two) times daily with a meal.   cetirizine (ZYRTEC) 10 MG tablet Take 10 mg by mouth daily.   Cholecalciferol (VITAMIN D3) 2000 units TABS Take by mouth.   clobetasol (TEMOVATE) 0.05 % external solution APPLY TO AFFECTED AREA EVERY DAY AT BEDTIME   Coenzyme Q10 (COQ10) 200 MG CAPS Take by mouth.   dutasteride (AVODART) 0.5 MG capsule TAKE ONE PILL DAILY X 1 WEEK, THEN ONE PILL ONCE WEEKLY   famotidine (PEPCID) 20 MG tablet Take 1 tablet (20 mg total) by mouth daily.   Flaxseed, Linseed, (FLAX SEED OIL) 1300 MG CAPS Take by mouth.   flunisolide (NASALIDE) 25 MCG/ACT (0.025%) SOLN Place 2 sprays into the nose daily.   Lifitegrast 5 % SOLN Apply to eye.   MILK THISTLE PO Take by mouth.   rosuvastatin (CRESTOR) 5 MG tablet TAKE 1 TABLET BY MOUTH EVERY DAY (Patient taking differently: 2 (two) times a week.)   No facility-administered encounter medications on file as of 09/09/2020.    Surgical History: Past Surgical History:  Procedure Laterality Date   BREAST BIOPSY Right 2010 and 04/18/2015   BREAST BIOPSY Right 10/26/2019   x clip path  pending mass w/ distortion   EYE SURGERY Right    keratectomy     Medical History: Past Medical History:  Diagnosis Date   Cataract    Hyperlipidemia     Family History: Family History  Problem Relation Age of Onset   Heart disease Mother    Stroke Father    Diabetes Brother    Diabetes Brother    Breast cancer Neg Hx     Social History: Social History   Socioeconomic History   Marital status: Married    Spouse name: Not on file   Number of children: Not on file   Years of education: Not on file   Highest education level: Not on file  Occupational History   Not on file  Tobacco Use   Smoking status: Never   Smokeless tobacco: Never  Vaping Use   Vaping Use: Never used  Substance and Sexual Activity   Alcohol use: Yes    Comment: occasionally    Drug use: Never   Sexual activity: Not on file  Other Topics Concern   Not on file  Social History Narrative   Not on file   Social Determinants of Health   Financial Resource Strain: Not on file  Food Insecurity: Not on file  Transportation Needs: Not on file  Physical Activity: Not on file  Stress: Not on file  Social Connections: Not on file  Review of Systems  Constitutional:  Negative for chills, diaphoresis and fatigue.  HENT:  Negative for ear pain, postnasal drip and sinus pressure.   Eyes:  Negative for photophobia, discharge, redness, itching and visual disturbance.  Respiratory:  Negative for cough, shortness of breath and wheezing.   Cardiovascular:  Negative for chest pain, palpitations and leg swelling.  Gastrointestinal:  Negative for abdominal pain, constipation, diarrhea, nausea and vomiting.  Genitourinary:  Negative for dysuria and flank pain.  Musculoskeletal:  Negative for arthralgias, back pain, gait problem and neck pain.  Skin:  Negative for color change.  Allergic/Immunologic: Negative for environmental allergies and food allergies.  Neurological:  Negative for dizziness and  headaches.  Hematological:  Does not bruise/bleed easily.  Psychiatric/Behavioral:  Negative for agitation, behavioral problems (depression) and hallucinations.     Vital Signs: BP (!) 161/84   Pulse 65   Temp (!) 97.5 F (36.4 C)   Resp 16   Ht 5\' 2"  (1.575 m)   Wt 148 lb 3.2 oz (67.2 kg)   SpO2 97%   BMI 27.11 kg/m    Physical Exam Constitutional:      General: She is not in acute distress.    Appearance: She is well-developed. She is not diaphoretic.  HENT:     Head: Normocephalic and atraumatic.     Mouth/Throat:     Pharynx: No oropharyngeal exudate.  Eyes:     Pupils: Pupils are equal, round, and reactive to light.  Neck:     Thyroid: No thyromegaly.     Vascular: No JVD.     Trachea: No tracheal deviation.  Cardiovascular:     Rate and Rhythm: Normal rate and regular rhythm.     Heart sounds: Normal heart sounds. No murmur heard.   No friction rub. No gallop.  Pulmonary:     Effort: Pulmonary effort is normal. No respiratory distress.     Breath sounds: No wheezing or rales.  Chest:     Chest wall: No tenderness.  Abdominal:     General: Bowel sounds are normal.     Palpations: Abdomen is soft.  Musculoskeletal:        General: Normal range of motion.     Cervical back: Normal range of motion and neck supple.  Lymphadenopathy:     Cervical: No cervical adenopathy.  Skin:    General: Skin is warm and dry.  Neurological:     Mental Status: She is alert and oriented to person, place, and time.     Cranial Nerves: No cranial nerve deficit.  Psychiatric:        Behavior: Behavior normal.        Thought Content: Thought content normal.        Judgment: Judgment normal.      Assessment/Plan: 1. Encounter for general adult medical examination with abnormal findings Update age appropriate diagnostics  as indicated  - CBC with Differential/Platelet - TSH - T4, free  2. Visit for screening mammogram - MM DIGITAL SCREENING BILATERAL; Future  3.  Abnormal mammogram H/o right breast abnormal mammogram, order sent if she needs one after screening mammo - MM Digital Diagnostic Unilat R; Future  4. Benign hypertension Bp has been elevated, will add low dose Losartan, monitor BP at home  - Comprehensive metabolic panel - losartan (COZAAR) 25 MG tablet; Take 1 tablet (25 mg total) by mouth daily.  Dispense: 90 tablet; Refill: 1  5. Other primary ovarian failure - DG Bone Density; Future  6. Mixed hyperlipidemia Continues to take crestor as before 2 x per week  - Lipid Panel With LDL/HDL Ratio - TSH - T4, free - Comprehensive metabolic panel  7. Gastroesophageal reflux disease without esophagitis Controlled  - famotidine (PEPCID) 20 MG tablet; Take 1 tablet (20 mg total) by mouth daily.  Dispense: 90 tablet; Refill: 3  8. Dysuria - UA/M w/rflx Culture, Routine - Microscopic Examination   General Counseling: brietta manso understanding of the findings of todays visit and agrees with plan of treatment. I have discussed any further diagnostic evaluation that may be needed or ordered today. We also reviewed her medications today. she has been encouraged to call the office with any questions or concerns that should arise related to todays visit.    Counseling:  [place] Controlled Substance Database was reviewed by me.  Orders Placed This Encounter  Procedures   MM DIGITAL SCREENING BILATERAL   MM Digital Diagnostic Unilat R   UA/M w/rflx Culture, Routine   CBC with Differential/Platelet   Lipid Panel With LDL/HDL Ratio   TSH   T4, free   Comprehensive metabolic panel    No orders of the defined types were placed in this encounter.   Total time spent:35 Minutes  Time spent includes review of chart, medications, test results, and follow up plan with the patient.     Lavera Guise, MD  Internal Medicine

## 2020-09-10 LAB — CBC WITH DIFFERENTIAL/PLATELET
Basophils Absolute: 0 10*3/uL (ref 0.0–0.2)
Basos: 0 %
EOS (ABSOLUTE): 0 10*3/uL (ref 0.0–0.4)
Eos: 1 %
Hematocrit: 40.2 % (ref 34.0–46.6)
Hemoglobin: 13.4 g/dL (ref 11.1–15.9)
Immature Grans (Abs): 0 10*3/uL (ref 0.0–0.1)
Immature Granulocytes: 0 %
Lymphocytes Absolute: 2.4 10*3/uL (ref 0.7–3.1)
Lymphs: 48 %
MCH: 31.4 pg (ref 26.6–33.0)
MCHC: 33.3 g/dL (ref 31.5–35.7)
MCV: 94 fL (ref 79–97)
Monocytes Absolute: 0.3 10*3/uL (ref 0.1–0.9)
Monocytes: 6 %
Neutrophils Absolute: 2.3 10*3/uL (ref 1.4–7.0)
Neutrophils: 45 %
Platelets: 207 10*3/uL (ref 150–450)
RBC: 4.27 x10E6/uL (ref 3.77–5.28)
RDW: 12.9 % (ref 11.7–15.4)
WBC: 5.1 10*3/uL (ref 3.4–10.8)

## 2020-09-10 LAB — MICROSCOPIC EXAMINATION
Bacteria, UA: NONE SEEN
Casts: NONE SEEN /lpf
RBC, Urine: NONE SEEN /hpf (ref 0–2)
WBC, UA: NONE SEEN /hpf (ref 0–5)

## 2020-09-10 LAB — LIPID PANEL WITH LDL/HDL RATIO
Cholesterol, Total: 277 mg/dL — ABNORMAL HIGH (ref 100–199)
HDL: 102 mg/dL (ref >39)
LDL Chol Calc (NIH): 163 mg/dL — ABNORMAL HIGH (ref 0–99)
LDL/HDL Ratio: 1.6 ratio (ref 0.0–3.2)
Triglycerides: 77 mg/dL (ref 0–149)
VLDL Cholesterol Cal: 12 mg/dL (ref 5–40)

## 2020-09-10 LAB — COMPREHENSIVE METABOLIC PANEL
ALT: 17 IU/L (ref 0–32)
AST: 17 IU/L (ref 0–40)
Albumin/Globulin Ratio: 2.2 (ref 1.2–2.2)
Albumin: 4.6 g/dL (ref 3.8–4.8)
Alkaline Phosphatase: 57 IU/L (ref 44–121)
BUN/Creatinine Ratio: 17 (ref 12–28)
BUN: 11 mg/dL (ref 8–27)
Bilirubin Total: 0.9 mg/dL (ref 0.0–1.2)
CO2: 25 mmol/L (ref 20–29)
Calcium: 10 mg/dL (ref 8.7–10.3)
Chloride: 103 mmol/L (ref 96–106)
Creatinine, Ser: 0.63 mg/dL (ref 0.57–1.00)
Globulin, Total: 2.1 g/dL (ref 1.5–4.5)
Glucose: 99 mg/dL (ref 65–99)
Potassium: 4 mmol/L (ref 3.5–5.2)
Sodium: 140 mmol/L (ref 134–144)
Total Protein: 6.7 g/dL (ref 6.0–8.5)
eGFR: 96 mL/min/{1.73_m2} (ref >59)

## 2020-09-10 LAB — UA/M W/RFLX CULTURE, ROUTINE
Bilirubin, UA: NEGATIVE
Glucose, UA: NEGATIVE
Ketones, UA: NEGATIVE
Leukocytes,UA: NEGATIVE
Nitrite, UA: NEGATIVE
Protein,UA: NEGATIVE
RBC, UA: NEGATIVE
Specific Gravity, UA: 1.015 (ref 1.005–1.030)
Urobilinogen, Ur: 0.2 mg/dL (ref 0.2–1.0)
pH, UA: 6.5 (ref 5.0–7.5)

## 2020-09-10 LAB — TSH: TSH: 2.06 u[IU]/mL (ref 0.450–4.500)

## 2020-09-10 LAB — T4, FREE: Free T4: 1.15 ng/dL (ref 0.82–1.77)

## 2020-09-13 ENCOUNTER — Telehealth: Payer: Self-pay

## 2020-09-13 ENCOUNTER — Other Ambulatory Visit: Payer: Self-pay

## 2020-09-13 DIAGNOSIS — E538 Deficiency of other specified B group vitamins: Secondary | ICD-10-CM

## 2020-09-13 NOTE — Telephone Encounter (Signed)
Called and spoke to pt and informed her of labs and that lipid panel was elevated.  Per DFK she wants pt to start taking crestor 5 days a week and also keep a log of her Bp readings.  Pt informed me that [facility] didn't add a B12 lab order to her labs and I called [facility] and had the b12 and folate test added to her recent labwork.  Pt seemed ok with taking crestor and bp readings

## 2020-09-13 NOTE — Progress Notes (Signed)
LMOM for pt to return call about labs

## 2020-09-13 NOTE — Progress Notes (Signed)
Labs reviewed, all looks good but not happy with her lipid profile, please inform her she might want to take Crestor at least 5 days of the week, will need few readings of her Blood pressure too, let me know

## 2020-09-13 NOTE — Telephone Encounter (Signed)
LMOM for pt to return call about lab results

## 2020-10-09 ENCOUNTER — Encounter: Payer: Self-pay | Admitting: Internal Medicine

## 2020-10-14 ENCOUNTER — Other Ambulatory Visit: Payer: Self-pay | Admitting: Internal Medicine

## 2020-10-14 ENCOUNTER — Encounter: Payer: Self-pay | Admitting: Internal Medicine

## 2020-10-14 ENCOUNTER — Telehealth: Payer: Self-pay

## 2020-10-14 DIAGNOSIS — E2839 Other primary ovarian failure: Secondary | ICD-10-CM

## 2020-10-14 DIAGNOSIS — E538 Deficiency of other specified B group vitamins: Secondary | ICD-10-CM

## 2020-10-14 NOTE — Progress Notes (Signed)
B 12

## 2020-10-14 NOTE — Telephone Encounter (Signed)
I spoke with [facility] regarding mammogram order. They stated patient needs to have a bilateral mammogram with right breast ultrasound. I relayed message to [facility]k-Toni

## 2020-10-14 NOTE — Telephone Encounter (Signed)
I spoke with Kristen Reid. They stated patient needs to have a diagnostic bilateral mammogram with ultrasound of right breast.

## 2020-10-14 NOTE — Telephone Encounter (Signed)
error 

## 2020-10-14 NOTE — Telephone Encounter (Signed)
See

## 2020-10-17 ENCOUNTER — Other Ambulatory Visit: Payer: Self-pay | Admitting: Internal Medicine

## 2020-10-17 ENCOUNTER — Other Ambulatory Visit: Payer: Self-pay

## 2020-10-17 ENCOUNTER — Telehealth: Payer: Self-pay

## 2020-10-17 ENCOUNTER — Encounter: Payer: Self-pay | Admitting: Internal Medicine

## 2020-10-17 DIAGNOSIS — E538 Deficiency of other specified B group vitamins: Secondary | ICD-10-CM | POA: Diagnosis not present

## 2020-10-17 DIAGNOSIS — R928 Other abnormal and inconclusive findings on diagnostic imaging of breast: Secondary | ICD-10-CM

## 2020-10-17 DIAGNOSIS — Z1231 Encounter for screening mammogram for malignant neoplasm of breast: Secondary | ICD-10-CM

## 2020-10-17 NOTE — Progress Notes (Unsigned)
ordered

## 2020-10-17 NOTE — Telephone Encounter (Signed)
Please check and see with [facility]

## 2020-10-17 NOTE — Telephone Encounter (Signed)
Mammogram order # needs to be [id number]. Ultrasound order needs to be [id number],

## 2020-10-17 NOTE — Telephone Encounter (Signed)
Great, I will let patient know.

## 2020-10-17 NOTE — Telephone Encounter (Signed)
Order #s for mammogram and ultrasound corrected. I notified patient so she can call to schedule-Kristen Reid

## 2020-10-18 ENCOUNTER — Encounter: Payer: Self-pay | Admitting: Internal Medicine

## 2020-10-18 LAB — VITAMIN B12: Vitamin B-12: 382 pg/mL (ref 232–1245)

## 2020-10-23 ENCOUNTER — Ambulatory Visit
Admission: RE | Admit: 2020-10-23 | Discharge: 2020-10-23 | Disposition: A | Discharge status: Home / Self Care | Payer: Medicare PPO | Source: Ambulatory Visit | Attending: Internal Medicine | Admitting: Internal Medicine

## 2020-10-23 ENCOUNTER — Other Ambulatory Visit: Payer: Self-pay

## 2020-10-23 DIAGNOSIS — E2839 Other primary ovarian failure: Secondary | ICD-10-CM | POA: Diagnosis not present

## 2020-10-23 DIAGNOSIS — R928 Other abnormal and inconclusive findings on diagnostic imaging of breast: Secondary | ICD-10-CM | POA: Diagnosis not present

## 2020-10-23 DIAGNOSIS — M85852 Other specified disorders of bone density and structure, left thigh: Secondary | ICD-10-CM | POA: Diagnosis not present

## 2020-10-23 DIAGNOSIS — R922 Inconclusive mammogram: Secondary | ICD-10-CM | POA: Diagnosis not present

## 2020-10-24 ENCOUNTER — Ambulatory Visit (INDEPENDENT_AMBULATORY_CARE_PROVIDER_SITE_OTHER): Payer: Medicare PPO

## 2020-10-24 DIAGNOSIS — E538 Deficiency of other specified B group vitamins: Secondary | ICD-10-CM

## 2020-10-25 DIAGNOSIS — E538 Deficiency of other specified B group vitamins: Secondary | ICD-10-CM | POA: Diagnosis not present

## 2020-10-25 MED ORDER — CYANOCOBALAMIN 1000 MCG/ML IJ SOLN
1000.0000 ug | Freq: Once | INTRAMUSCULAR | Status: AC
  Administered 2020-10-25: 1000 ug via INTRAMUSCULAR

## 2020-11-01 ENCOUNTER — Encounter: Payer: Self-pay | Admitting: Internal Medicine

## 2020-11-18 NOTE — Telephone Encounter (Signed)
There is no order/referral to [facility] for her.

## 2020-11-25 ENCOUNTER — Other Ambulatory Visit: Payer: Self-pay | Admitting: Physician Assistant

## 2020-11-25 ENCOUNTER — Ambulatory Visit: Payer: Medicare PPO

## 2020-11-25 DIAGNOSIS — R928 Other abnormal and inconclusive findings on diagnostic imaging of breast: Secondary | ICD-10-CM

## 2020-11-26 ENCOUNTER — Other Ambulatory Visit: Payer: Self-pay | Admitting: Physician Assistant

## 2020-11-26 DIAGNOSIS — R928 Other abnormal and inconclusive findings on diagnostic imaging of breast: Secondary | ICD-10-CM

## 2020-11-26 NOTE — Telephone Encounter (Signed)
She is scheduled at [place] on 12/10/20 @2 :00. I will ask her if she wants to go there or [facility]

## 2020-11-26 NOTE — Telephone Encounter (Signed)
Does she prefer [place]? I sent referral to [facility]

## 2020-11-26 NOTE — Telephone Encounter (Signed)
She is good going to [facility].

## 2020-11-29 ENCOUNTER — Other Ambulatory Visit: Payer: Self-pay

## 2020-11-29 ENCOUNTER — Ambulatory Visit (INDEPENDENT_AMBULATORY_CARE_PROVIDER_SITE_OTHER): Payer: Medicare PPO

## 2020-11-29 DIAGNOSIS — E538 Deficiency of other specified B group vitamins: Secondary | ICD-10-CM | POA: Diagnosis not present

## 2020-11-29 MED ORDER — CYANOCOBALAMIN 1000 MCG/ML IJ SOLN
1000.0000 ug | Freq: Once | INTRAMUSCULAR | Status: AC
  Administered 2020-11-29: 1000 ug via INTRAMUSCULAR

## 2020-11-29 NOTE — Progress Notes (Cosign Needed)
B12 injection done

## 2020-12-03 ENCOUNTER — Ambulatory Visit: Payer: Medicare PPO | Admitting: Surgery

## 2020-12-06 DIAGNOSIS — R208 Other disturbances of skin sensation: Secondary | ICD-10-CM | POA: Diagnosis not present

## 2020-12-06 DIAGNOSIS — D485 Neoplasm of uncertain behavior of skin: Secondary | ICD-10-CM | POA: Diagnosis not present

## 2020-12-06 DIAGNOSIS — L538 Other specified erythematous conditions: Secondary | ICD-10-CM | POA: Diagnosis not present

## 2020-12-06 DIAGNOSIS — B078 Other viral warts: Secondary | ICD-10-CM | POA: Diagnosis not present

## 2020-12-06 DIAGNOSIS — L668 Other cicatricial alopecia: Secondary | ICD-10-CM | POA: Diagnosis not present

## 2020-12-06 DIAGNOSIS — C44311 Basal cell carcinoma of skin of nose: Secondary | ICD-10-CM | POA: Diagnosis not present

## 2020-12-10 ENCOUNTER — Other Ambulatory Visit: Payer: Self-pay

## 2020-12-10 ENCOUNTER — Ambulatory Visit: Payer: Medicare PPO | Admitting: Surgery

## 2020-12-10 ENCOUNTER — Encounter: Payer: Self-pay | Admitting: Surgery

## 2020-12-10 VITALS — BP 138/80 | HR 73 | Temp 97.9°F | Ht 62.0 in | Wt 149.6 lb

## 2020-12-10 DIAGNOSIS — R928 Other abnormal and inconclusive findings on diagnostic imaging of breast: Secondary | ICD-10-CM

## 2020-12-10 NOTE — Patient Instructions (Addendum)
You have been placed on the recall list. We will call you in March to schedule your April appointment.   If you have any concerns or questions, please feel free to call our office.   Breast Self-Awareness Breast self-awareness is knowing how your breasts look and feel. Doing breast self-awareness is important. It allows you to catch a breast problem early while it is still small and can be treated. All women should do breast self-awareness, including women who have had breast implants. Tell your doctor if you notice a change in your breasts. What you need: A mirror. A well-lit room. How to do a breast self-exam A breast self-exam is one way to learn what is normal for your breasts and to check for changes. To do a breast self-exam: Look for changes  Take off all the clothes above your waist. Stand in front of a mirror in a room with good lighting. Put your hands on your hips. Push your hands down. Look at your breasts and nipples in the mirror to see if one breast or nipple looks different from the other. Check to see if: The shape of one breast is different. The size of one breast is different. There are wrinkles, dips, and bumps in one breast and not the other. Look at each breast for changes in the skin, such as: Redness. Scaly areas. Look for changes in your nipples, such as: Liquid around the nipples. Bleeding. Dimpling. Redness. A change in where the nipples are. Feel for changes  Lie on your back on the floor. Feel each breast. To do this, follow these steps: Pick a breast to feel. Put the arm closest to that breast above your head. Use your other arm to feel the nipple area of your breast. Feel the area with the pads of your three middle fingers by making small circles with your fingers. For the first circle, press lightly. For the second circle, press harder. For the third circle, press even harder. Keep making circles with your fingers at the different pressures as you  move down your breast. Stop when you feel your ribs. Move your fingers a little toward the center of your body. Start making circles with your fingers again, this time going up until you reach your collarbone. Keep making up-and-down circles until you reach your armpit. Remember to keep using the three pressures. Feel the other breast in the same way. Sit or stand in the tub or shower. With soapy water on your skin, feel each breast the same way you did in step 2 when you were lying on the floor. Write down what you find Writing down what you find can help you remember what to tell your doctor. Write down: What is normal for each breast. Any changes you find in each breast, including: The kind of changes you find. Whether you have pain. Size and location of any lumps. When you last had your menstrual period. General tips Check your breasts every month. If you are breastfeeding, the best time to check your breasts is after you feed your baby or after you use a breast pump. If you get menstrual periods, the best time to check your breasts is 5-7 days after your menstrual period is over. With time, you will become comfortable with the self-exam, and you will begin to know if there are changes in your breasts. Contact a doctor if you: See a change in the shape or size of your breasts or nipples. See a change in  the skin of your breast or nipples, such as red or scaly skin. Have fluid coming from your nipples that is not normal. Find a lump or thick area that was not there before. Have pain in your breasts. Have any concerns about your breast health. Summary Breast self-awareness includes looking for changes in your breasts, as well as feeling for changes within your breasts. Breast self-awareness should be done in front of a mirror in a well-lit room. You should check your breasts every month. If you get menstrual periods, the best time to check your breasts is 5-7 days after your menstrual  period is over. Let your doctor know of any changes you see in your breasts, including changes in size, changes on the skin, pain or tenderness, or fluid from your nipples that is not normal. This information is not intended to replace advice given to you by your health care provider. Make sure you discuss any questions you have with your health care provider. Document Revised: 10/05/2017 Document Reviewed: 10/05/2017 Elsevier Patient Education  [place].

## 2020-12-10 NOTE — Progress Notes (Signed)
Patient ID: Kristen Reid, female   DOB: 1951-01-30, 70 y.o.   MRN: [id number]  Chief Complaint: Architectural distortion right breast.  History of Present Illness Kristen Reid is a 70 y.o. female with a right breast biopsy percutaneously obtained in October 26, 2019 for architectural distortion.  The biopsy was benign, but as usual excision was recommended.  Due to COVID elective surgery restrictions, and other logistical challenges it was deferred for months.  She subsequently obtained a follow-up mammogram 1 year from her prior.  This mammograms report confirms no distinct changes.   She has a history of birth control and hormonal replacement.  She is menopausal with no family history of breast cancer.  She underwent menses at the age of 18, had 3 pregnancies with her first baby born at the mother's age of 30 years.  She breast-fed for 10 months.  She is never felt a lump, never had any discharge from the nipple, never had any skin changes or breast pain.  She does monthly growth breast exams and has not appreciated any suspicious or dominant nodularity.  Past Medical History Past Medical History:  Diagnosis Date   Cataract    Hyperlipidemia       Past Surgical History:  Procedure Laterality Date   BREAST BIOPSY Right 2010 and 04/18/2015   BREAST BIOPSY Right 10/26/2019   x clip path pending mass w/ distortion   CESAREAN SECTION     ECTOPIC PREGNANCY SURGERY  1981   2   EYE SURGERY Right    keratectomy    ROBOTIC ASSISTED LAPAROSCOPIC OVARIAN CYSTECTOMY     TONSILECTOMY/ADENOIDECTOMY WITH MYRINGOTOMY  1960    No Known Allergies  Current Outpatient Medications  Medication Sig Dispense Refill   Apple Cider Vinegar 300 MG TABS Take by mouth.     calcium carbonate (OS-CAL) 600 MG TABS tablet Take 600 mg by mouth 2 (two) times daily with a meal.     cetirizine (ZYRTEC) 10 MG tablet Take 10 mg by mouth daily.     Cholecalciferol (VITAMIN D3) 2000 units TABS Take by mouth.      clobetasol (TEMOVATE) 0.05 % external solution APPLY TO AFFECTED AREA EVERY DAY AT BEDTIME     Coenzyme Q10 (COQ10) 200 MG CAPS Take by mouth.     dutasteride (AVODART) 0.5 MG capsule TAKE ONE PILL DAILY X 1 WEEK, THEN ONE PILL ONCE WEEKLY     famotidine (PEPCID) 20 MG tablet Take 1 tablet (20 mg total) by mouth daily. 90 tablet 3   flunisolide (NASALIDE) 25 MCG/ACT (0.025%) SOLN Place 2 sprays into the nose daily.     fluticasone (VERAMYST) 27.5 MCG/SPRAY nasal spray Place 2 sprays into the nose daily.     Lifitegrast 5 % SOLN Apply to eye.     loratadine (CLARITIN) 10 MG tablet Take 10 mg by mouth daily.     losartan (COZAAR) 25 MG tablet Take 1 tablet (25 mg total) by mouth daily. 90 tablet 1   MILK THISTLE PO Take by mouth.     rosuvastatin (CRESTOR) 5 MG tablet TAKE 1 TABLET BY MOUTH EVERY DAY (Patient taking differently: 2 (two) times a week.) 90 tablet 3   No current facility-administered medications for this visit.    Family History Family History  Problem Relation Age of Onset   Heart disease Mother    Stroke Father    Diabetes Brother    Diabetes Brother    Breast cancer Neg Hx  Social History Social History   Tobacco Use   Smoking status: Never   Smokeless tobacco: Never  Vaping Use   Vaping Use: Never used  Substance Use Topics   Alcohol use: Yes    Comment: occasionally    Drug use: Never        Review of Systems  Constitutional: Negative.   HENT:  Positive for tinnitus.   Eyes:  Positive for blurred vision (Dry eyes, early cataract formation).  Respiratory: Negative.    Cardiovascular: Negative.   Gastrointestinal:  Positive for heartburn.  Genitourinary: Negative.   Skin: Negative.   Neurological: Negative.   Psychiatric/Behavioral: Negative.       Physical Exam Blood pressure 138/80, pulse 73, temperature 97.9 F (36.6 C), temperature source Oral, height 5\' 2"  (1.575 m), weight 149 lb 9.6 oz (67.9 kg), SpO2 97 %. Last Weight  Most  recent update: 12/10/2020  2:10 PM    Weight  67.9 kg (149 lb 9.6 oz)             CONSTITUTIONAL: Well developed, and nourished, appropriately responsive and aware without distress.   EYES: Sclera non-icteric.   EARS, NOSE, MOUTH AND THROAT: Mask worn.    Hearing is intact to voice.  NECK: Trachea is midline, and there is no jugular venous distension.  LYMPH NODES:  Lymph nodes in the neck are not enlarged. RESPIRATORY:  Lungs are clear, and breath sounds are equal bilaterally. Normal respiratory effort without pathologic use of accessory muscles. CARDIOVASCULAR: Heart is regular in rate and rhythm. GI: The abdomen is  soft, nontender, and nondistended.  MUSCULOSKELETAL:  Symmetrical muscle tone appreciated in all four extremities.    SKIN: Skin turgor is normal. No pathologic skin lesions appreciated.  NEUROLOGIC:  Motor and sensation appear grossly normal.  Cranial nerves are grossly without defect. PSYCH:  Alert and oriented to person, place and time. Affect is appropriate for situation.  Data Reviewed I have personally reviewed what is currently available of the patient's imaging, recent labs and medical records.   Labs:  CBC Latest Ref Rng & Units 09/09/2020 09/07/2019 08/23/2018  WBC 3.4 - 10.8 x10E3/uL 5.1 3.7 5.0  Hemoglobin 11.1 - 15.9 g/dL 13.4 13.5 13.9  Hematocrit 34.0 - 46.6 % 40.2 40.2 40.2  Platelets 150 - 450 x10E3/uL 207 199 200   CMP Latest Ref Rng & Units 09/09/2020 09/07/2019 08/23/2018  Glucose 65 - 99 mg/dL 99 87 93  BUN 8 - 27 mg/dL 11 10 13   Creatinine 0.57 - 1.00 mg/dL 0.63 0.62 0.67  Sodium 134 - 144 mmol/L 140 143 141  Potassium 3.5 - 5.2 mmol/L 4.0 4.1 3.8  Chloride 96 - 106 mmol/L 103 103 102  CO2 20 - 29 mmol/L 25 27 23   Calcium 8.7 - 10.3 mg/dL 10.0 9.3 9.6  Total Protein 6.0 - 8.5 g/dL 6.7 6.6 6.8  Total Bilirubin 0.0 - 1.2 mg/dL 0.9 0.6 1.3(H)  Alkaline Phos 44 - 121 IU/L 57 52 55  AST 0 - 40 IU/L 17 18 17   ALT 0 - 32 IU/L 17 12 13        Imaging: CLINICAL DATA:  Status post stereotactic guided biopsy of RIGHT breast architectural distortion which demonstrated fibrosis and fibrocystic change. Surgical consultation for excision was recommended. Patient has not been evaluated by surgeon at this point in time.   EXAM: DIGITAL DIAGNOSTIC BILATERAL MAMMOGRAM WITH TOMOSYNTHESIS AND CAD   TECHNIQUE: Bilateral digital diagnostic mammography and breast tomosynthesis was performed. The images were evaluated  with computer-aided detection.   COMPARISON:  Previous exam(s).   ACR Breast Density Category c: The breast tissue is heterogeneously dense, which may obscure small masses.   FINDINGS: There is an X shaped biopsy clip noted within a region of architectural/distortion in the RIGHT upper slightly inner breast at middle depth. Extent of adjacent architectural distortion is stable in comparison to prior. No suspicious mass, distortion, or microcalcifications are identified to suggest presence of malignancy in the LEFT breast.   IMPRESSION: 1. Stable appearance of RIGHT breast architectural distortion which demonstrated fibrosis and fibrocystic change. X clip is in appropriate position. Recommend surgical consultation for consideration of excision versus close monitoring. 2. No mammographic evidence of malignancy in the LEFT breast.   RECOMMENDATION: Recommend surgical consultation for consideration of excision versus close monitoring of a previously biopsied area of architectural distortion.   I have discussed the findings and recommendations with the patient. If applicable, a reminder letter will be sent to the patient regarding the next appointment.   BI-RADS CATEGORY  2: Benign.     Electronically Signed   By: Valentino Saxon M.D.   On: 10/23/2020 11:49 Within last 24 hrs: No results found.  Assessment    Architectural distortion left breast. There are no problems to display for this  patient.   Plan    We discussed options versus excisional biopsy versus continued cautious observation.  Being that she is already had a year of observation without remarkable change in her mammography, she feels a bit confident in proceeding with this route, especially with intentional repeat in 6 months.  We will plan on examining her again at that time.  Face-to-face time spent with the patient and accompanying care providers(if present) was 25 minutes, with more than 50% of the time spent counseling, educating, and coordinating care of the patient.    These notes generated with voice recognition software. I apologize for typographical errors.  Ronny Bacon M.D., FACS 12/10/2020, 2:38 PM

## 2020-12-23 ENCOUNTER — Telehealth: Payer: Self-pay

## 2020-12-23 ENCOUNTER — Other Ambulatory Visit: Payer: Self-pay

## 2020-12-23 ENCOUNTER — Ambulatory Visit (INDEPENDENT_AMBULATORY_CARE_PROVIDER_SITE_OTHER): Payer: Medicare PPO

## 2020-12-23 DIAGNOSIS — E538 Deficiency of other specified B group vitamins: Secondary | ICD-10-CM | POA: Diagnosis not present

## 2020-12-23 MED ORDER — CYANOCOBALAMIN 1000 MCG/ML IJ SOLN
1000.0000 ug | Freq: Once | INTRAMUSCULAR | Status: AC
  Administered 2020-12-23: 1000 ug via INTRAMUSCULAR

## 2020-12-23 NOTE — Progress Notes (Cosign Needed)
B12 injection given

## 2020-12-23 NOTE — Telephone Encounter (Signed)
Left vm letting patient know provider for her upcoming appointments has been changed from [facility] to [facility] and that if she has any questions, to contact office-Toni

## 2021-01-16 DIAGNOSIS — J309 Allergic rhinitis, unspecified: Secondary | ICD-10-CM | POA: Diagnosis not present

## 2021-01-27 ENCOUNTER — Other Ambulatory Visit: Payer: Self-pay

## 2021-01-27 ENCOUNTER — Ambulatory Visit (INDEPENDENT_AMBULATORY_CARE_PROVIDER_SITE_OTHER): Payer: Medicare PPO

## 2021-01-27 DIAGNOSIS — E538 Deficiency of other specified B group vitamins: Secondary | ICD-10-CM

## 2021-01-27 MED ORDER — CYANOCOBALAMIN 1000 MCG/ML IJ SOLN
1000.0000 ug | Freq: Once | INTRAMUSCULAR | Status: AC
  Administered 2021-01-27: 10:00:00 1000 ug via INTRAMUSCULAR

## 2021-01-29 DIAGNOSIS — H25813 Combined forms of age-related cataract, bilateral: Secondary | ICD-10-CM | POA: Diagnosis not present

## 2021-01-29 DIAGNOSIS — H18453 Nodular corneal degeneration, bilateral: Secondary | ICD-10-CM | POA: Diagnosis not present

## 2021-01-29 DIAGNOSIS — H16223 Keratoconjunctivitis sicca, not specified as Sjogren's, bilateral: Secondary | ICD-10-CM | POA: Diagnosis not present

## 2021-02-03 HISTORY — PX: BASAL CELL CARCINOMA EXCISION: SHX1214

## 2021-02-05 DIAGNOSIS — L988 Other specified disorders of the skin and subcutaneous tissue: Secondary | ICD-10-CM | POA: Diagnosis not present

## 2021-02-05 DIAGNOSIS — C44311 Basal cell carcinoma of skin of nose: Secondary | ICD-10-CM | POA: Diagnosis not present

## 2021-02-05 DIAGNOSIS — L578 Other skin changes due to chronic exposure to nonionizing radiation: Secondary | ICD-10-CM | POA: Diagnosis not present

## 2021-02-05 DIAGNOSIS — L814 Other melanin hyperpigmentation: Secondary | ICD-10-CM | POA: Diagnosis not present

## 2021-02-20 ENCOUNTER — Ambulatory Visit (INDEPENDENT_AMBULATORY_CARE_PROVIDER_SITE_OTHER): Payer: Medicare PPO

## 2021-02-20 ENCOUNTER — Other Ambulatory Visit: Payer: Self-pay

## 2021-02-20 DIAGNOSIS — E538 Deficiency of other specified B group vitamins: Secondary | ICD-10-CM | POA: Diagnosis not present

## 2021-02-20 MED ORDER — CYANOCOBALAMIN 1000 MCG/ML IJ SOLN
1000.0000 ug | Freq: Once | INTRAMUSCULAR | Status: AC
  Administered 2021-02-20: 09:00:00 1000 ug via INTRAMUSCULAR

## 2021-03-01 ENCOUNTER — Other Ambulatory Visit: Payer: Self-pay | Admitting: Internal Medicine

## 2021-03-01 DIAGNOSIS — I10 Essential (primary) hypertension: Secondary | ICD-10-CM

## 2021-03-11 ENCOUNTER — Other Ambulatory Visit: Payer: Self-pay

## 2021-03-11 ENCOUNTER — Encounter: Payer: Self-pay | Admitting: Nurse Practitioner

## 2021-03-11 ENCOUNTER — Ambulatory Visit: Payer: Medicare PPO | Admitting: Nurse Practitioner

## 2021-03-11 VITALS — BP 138/84 | HR 60 | Temp 97.8°F | Resp 16 | Ht 62.0 in | Wt 152.0 lb

## 2021-03-11 DIAGNOSIS — I10 Essential (primary) hypertension: Secondary | ICD-10-CM

## 2021-03-11 DIAGNOSIS — R928 Other abnormal and inconclusive findings on diagnostic imaging of breast: Secondary | ICD-10-CM | POA: Diagnosis not present

## 2021-03-11 DIAGNOSIS — H2513 Age-related nuclear cataract, bilateral: Secondary | ICD-10-CM | POA: Diagnosis not present

## 2021-03-11 DIAGNOSIS — H18452 Nodular corneal degeneration, left eye: Secondary | ICD-10-CM | POA: Diagnosis not present

## 2021-03-11 NOTE — Progress Notes (Signed)
[facility], [place] [address]  Internal MEDICINE  Office Visit Note  Patient Name: Kristen Reid  [id number]  [id number]  Date of Service: 03/11/2021  Chief Complaint  Patient presents with   Follow-up   Hyperlipidemia   Hypertension    HPI [name] presents for a follow up visit for hypertension and hyperlipidemia. Two years ago she had a diagnostic mammogram and biopsy. She had a consultation with [facility] for resection. The next year she had another diagnostic mammogram and a consultation at [facility] but was upset with the surgeon's bedside manor and wants to go back to [facility] but it is time for her to have the repeat mammogram with ultrasound again.  No refills needed today.   Current Medication: Outpatient Encounter Medications as of 03/11/2021  Medication Sig Note   Apple Cider Vinegar 300 MG TABS Take by mouth.    calcium carbonate (OS-CAL) 600 MG TABS tablet Take 600 mg by mouth 2 (two) times daily with a meal.    cetirizine (ZYRTEC) 10 MG tablet Take 10 mg by mouth daily.    Cholecalciferol (VITAMIN D3) 2000 units TABS Take by mouth.    clobetasol (TEMOVATE) 0.05 % external solution APPLY TO AFFECTED AREA EVERY DAY AT BEDTIME    Coenzyme Q10 (COQ10) 200 MG CAPS Take by mouth.    dutasteride (AVODART) 0.5 MG capsule TAKE ONE PILL DAILY X 1 WEEK, THEN ONE PILL ONCE WEEKLY    famotidine (PEPCID) 20 MG tablet Take 1 tablet (20 mg total) by mouth daily.    flunisolide (NASALIDE) 25 MCG/ACT (0.025%) SOLN Place 2 sprays into the nose daily.    fluticasone (VERAMYST) 27.5 MCG/SPRAY nasal spray Place 2 sprays into the nose daily.    Lifitegrast 5 % SOLN Apply to eye.    loratadine (CLARITIN) 10 MG tablet Take 10 mg by mouth daily.    losartan (COZAAR) 25 MG tablet TAKE 1 TABLET (25 MG TOTAL) BY MOUTH DAILY.    MILK THISTLE PO Take by mouth.    rosuvastatin (CRESTOR) 5 MG tablet TAKE 1 TABLET BY  MOUTH EVERY DAY (Patient taking differently: 2 (two) times a week.) 12/10/2020: Taking differently: pt is taking it 5 days weekly.   No facility-administered encounter medications on file as of 03/11/2021.    Surgical History: Past Surgical History:  Procedure Laterality Date   BREAST BIOPSY Right 2010 and 04/18/2015   BREAST BIOPSY Right 10/26/2019   x clip path pending mass w/ distortion   CESAREAN SECTION     ECTOPIC PREGNANCY SURGERY  1981   2   EYE SURGERY Right    keratectomy    ROBOTIC ASSISTED LAPAROSCOPIC OVARIAN CYSTECTOMY     TONSILECTOMY/ADENOIDECTOMY WITH MYRINGOTOMY  1960    Medical History: Past Medical History:  Diagnosis Date   Cataract    Hyperlipidemia    Hypertension     Family History: Family History  Problem Relation Age of Onset   Heart disease Mother    Stroke Father    Diabetes Brother    Diabetes Brother    Breast cancer Neg Hx     Social History   Socioeconomic History   Marital status: Married    Spouse name: Not on file   Number of children: Not on file   Years of education: Not on file   Highest education level: Not on file  Occupational History   Not on file  Tobacco Use   Smoking  status: Never   Smokeless tobacco: Never  Vaping Use   Vaping Use: Never used  Substance and Sexual Activity   Alcohol use: Yes    Comment: occasionally    Drug use: Never   Sexual activity: Not on file  Other Topics Concern   Not on file  Social History Narrative   Not on file   Social Determinants of Health   Financial Resource Strain: Not on file  Food Insecurity: Not on file  Transportation Needs: Not on file  Physical Activity: Not on file  Stress: Not on file  Social Connections: Not on file  Intimate Partner Violence: Not on file      Review of Systems  Constitutional:  Negative for chills, fatigue and unexpected weight change.  HENT:  Negative for congestion, rhinorrhea, sneezing and sore throat.   Eyes:  Negative for  redness.  Respiratory:  Negative for cough, chest tightness and shortness of breath.   Cardiovascular:  Negative for chest pain and palpitations.  Gastrointestinal:  Negative for abdominal pain, constipation, diarrhea, nausea and vomiting.  Genitourinary:  Negative for dysuria and frequency.  Musculoskeletal:  Negative for arthralgias, back pain, joint swelling and neck pain.  Skin:  Negative for rash.  Neurological: Negative.  Negative for tremors and numbness.  Hematological:  Negative for adenopathy. Does not bruise/bleed easily.  Psychiatric/Behavioral:  Negative for behavioral problems (Depression), sleep disturbance and suicidal ideas. The patient is not nervous/anxious.    Vital Signs: BP 138/84    Pulse 60    Temp 97.8 F (36.6 C)    Resp 16    Ht 5\' 2"  (1.575 m)    Wt 152 lb (68.9 kg)    SpO2 98%    BMI 27.80 kg/m    Physical Exam Vitals reviewed.  Constitutional:      General: She is not in acute distress.    Appearance: Normal appearance. She is normal weight. She is not ill-appearing.  HENT:     Head: Normocephalic and atraumatic.  Eyes:     Pupils: Pupils are equal, round, and reactive to light.  Cardiovascular:     Rate and Rhythm: Normal rate and regular rhythm.  Pulmonary:     Effort: Pulmonary effort is normal. No respiratory distress.  Neurological:     Mental Status: She is alert and oriented to person, place, and time.     Cranial Nerves: No cranial nerve deficit.     Coordination: Coordination normal.     Gait: Gait normal.  Psychiatric:        Mood and Affect: Mood normal.        Behavior: Behavior normal.       Assessment/Plan: 1. Abnormal mammogram Mammogram and ultrasound ordered.  - US BREAST LTD UNI RIGHT INC AXILLA; Future  2. Benign hypertension BP is stable with current medications   General Counseling: kataleia quaranta understanding of the findings of todays visit and agrees with plan of treatment. I have discussed any further  diagnostic evaluation that may be needed or ordered today. We also reviewed her medications today. she has been encouraged to call the office with any questions or concerns that should arise related to todays visit.    Orders Placed This Encounter  Procedures   US BREAST LTD UNI RIGHT INC AXILLA    No orders of the defined types were placed in this encounter.   Return if symptoms worsen or fail to improve.   Total time spent:15 Minutes Time spent includes review  of chart, medications, test results, and follow up plan with the patient.   [place] Controlled Substance Database was reviewed by me.  This patient was seen by Jonetta Osgood, FNP-C in collaboration with Dr. Clayborn Bigness as a part of collaborative care agreement.   Ulani Degrasse R. Valetta Fuller, MSN, FNP-C Internal medicine

## 2021-03-18 ENCOUNTER — Telehealth: Payer: Self-pay

## 2021-03-18 ENCOUNTER — Other Ambulatory Visit: Payer: Self-pay | Admitting: Obstetrics and Gynecology

## 2021-03-18 ENCOUNTER — Other Ambulatory Visit: Payer: Self-pay | Admitting: Nurse Practitioner

## 2021-03-18 DIAGNOSIS — R928 Other abnormal and inconclusive findings on diagnostic imaging of breast: Secondary | ICD-10-CM

## 2021-03-19 ENCOUNTER — Other Ambulatory Visit: Payer: Self-pay | Admitting: Nurse Practitioner

## 2021-03-19 DIAGNOSIS — R928 Other abnormal and inconclusive findings on diagnostic imaging of breast: Secondary | ICD-10-CM

## 2021-03-19 NOTE — Telephone Encounter (Signed)
Notified patient order had been corrected so she can call to schedule mammogram-Toni

## 2021-03-24 ENCOUNTER — Ambulatory Visit: Payer: Medicare PPO

## 2021-03-25 ENCOUNTER — Ambulatory Visit (INDEPENDENT_AMBULATORY_CARE_PROVIDER_SITE_OTHER): Payer: Medicare PPO | Admitting: Internal Medicine

## 2021-03-25 DIAGNOSIS — E538 Deficiency of other specified B group vitamins: Secondary | ICD-10-CM

## 2021-03-25 MED ORDER — CYANOCOBALAMIN 1000 MCG/ML IJ SOLN
1000.0000 ug | Freq: Once | INTRAMUSCULAR | Status: AC
  Administered 2021-03-25: 15:00:00 1000 ug via INTRAMUSCULAR

## 2021-03-28 DIAGNOSIS — H18452 Nodular corneal degeneration, left eye: Secondary | ICD-10-CM | POA: Diagnosis not present

## 2021-03-28 HISTORY — PX: OTHER SURGICAL HISTORY: SHX169

## 2021-04-07 NOTE — Progress Notes (Signed)
B12 injection ( 1000 mcg of cyanocobalamin is given by CMA )

## 2021-04-16 ENCOUNTER — Encounter: Payer: Self-pay | Admitting: Nurse Practitioner

## 2021-04-21 ENCOUNTER — Other Ambulatory Visit: Payer: Self-pay

## 2021-04-21 ENCOUNTER — Ambulatory Visit (INDEPENDENT_AMBULATORY_CARE_PROVIDER_SITE_OTHER): Payer: Medicare PPO

## 2021-04-21 DIAGNOSIS — E538 Deficiency of other specified B group vitamins: Secondary | ICD-10-CM | POA: Diagnosis not present

## 2021-04-21 MED ORDER — CYANOCOBALAMIN 1000 MCG/ML IJ SOLN
1000.0000 ug | Freq: Once | INTRAMUSCULAR | Status: AC
  Administered 2021-04-21: 1000 ug via INTRAMUSCULAR

## 2021-04-23 HISTORY — PX: CATARACT EXTRACTION: SUR2

## 2021-05-02 ENCOUNTER — Ambulatory Visit
Admission: RE | Admit: 2021-05-02 | Discharge: 2021-05-02 | Disposition: A | Discharge status: Home / Self Care | Payer: Medicare PPO | Source: Ambulatory Visit | Attending: Nurse Practitioner | Admitting: Nurse Practitioner

## 2021-05-02 ENCOUNTER — Other Ambulatory Visit: Payer: Self-pay

## 2021-05-02 DIAGNOSIS — N6312 Unspecified lump in the right breast, upper inner quadrant: Secondary | ICD-10-CM | POA: Diagnosis not present

## 2021-05-02 DIAGNOSIS — R922 Inconclusive mammogram: Secondary | ICD-10-CM | POA: Diagnosis not present

## 2021-05-02 DIAGNOSIS — R928 Other abnormal and inconclusive findings on diagnostic imaging of breast: Secondary | ICD-10-CM | POA: Insufficient documentation

## 2021-05-04 HISTORY — PX: CATARACT EXTRACTION: SUR2

## 2021-05-12 DIAGNOSIS — H2513 Age-related nuclear cataract, bilateral: Secondary | ICD-10-CM | POA: Diagnosis not present

## 2021-05-13 ENCOUNTER — Telehealth: Payer: Self-pay

## 2021-05-13 ENCOUNTER — Other Ambulatory Visit: Payer: Self-pay | Admitting: Nurse Practitioner

## 2021-05-13 DIAGNOSIS — R928 Other abnormal and inconclusive findings on diagnostic imaging of breast: Secondary | ICD-10-CM

## 2021-05-14 ENCOUNTER — Telehealth: Payer: Self-pay

## 2021-05-14 NOTE — Telephone Encounter (Signed)
Updated general surgery referral sent via proficient to [facility] per patient's request. Notified patient-Kristen Reid ?

## 2021-05-19 ENCOUNTER — Other Ambulatory Visit: Payer: Self-pay

## 2021-05-19 ENCOUNTER — Ambulatory Visit (INDEPENDENT_AMBULATORY_CARE_PROVIDER_SITE_OTHER): Payer: Medicare PPO

## 2021-05-19 DIAGNOSIS — E538 Deficiency of other specified B group vitamins: Secondary | ICD-10-CM

## 2021-05-19 MED ORDER — CYANOCOBALAMIN 1000 MCG/ML IJ SOLN
1000.0000 ug | Freq: Once | INTRAMUSCULAR | Status: AC
  Administered 2021-05-19: 1000 ug via INTRAMUSCULAR

## 2021-05-21 DIAGNOSIS — H2511 Age-related nuclear cataract, right eye: Secondary | ICD-10-CM | POA: Diagnosis not present

## 2021-05-29 ENCOUNTER — Ambulatory Visit: Payer: Medicare PPO | Admitting: Surgery

## 2021-05-30 NOTE — Telephone Encounter (Signed)
Appointment scheduled for 06/27/21 @ [facility] ?

## 2021-06-04 DIAGNOSIS — H2512 Age-related nuclear cataract, left eye: Secondary | ICD-10-CM | POA: Diagnosis not present

## 2021-06-16 ENCOUNTER — Ambulatory Visit (INDEPENDENT_AMBULATORY_CARE_PROVIDER_SITE_OTHER): Payer: Medicare PPO

## 2021-06-16 DIAGNOSIS — E538 Deficiency of other specified B group vitamins: Secondary | ICD-10-CM

## 2021-06-16 MED ORDER — CYANOCOBALAMIN 1000 MCG/ML IJ SOLN
1000.0000 ug | Freq: Once | INTRAMUSCULAR | Status: AC
  Administered 2021-06-16: 1000 ug via INTRAMUSCULAR

## 2021-06-27 ENCOUNTER — Ambulatory Visit: Payer: Self-pay | Admitting: General Surgery

## 2021-06-27 DIAGNOSIS — N6312 Unspecified lump in the right breast, upper inner quadrant: Secondary | ICD-10-CM

## 2021-07-02 ENCOUNTER — Other Ambulatory Visit: Payer: Self-pay | Admitting: General Surgery

## 2021-07-02 DIAGNOSIS — N6312 Unspecified lump in the right breast, upper inner quadrant: Secondary | ICD-10-CM

## 2021-07-07 IMAGING — MG MM BREAST BX W/ LOC DEV 1ST LESION IMAGE BX SPEC STEREO GUIDE*R*
8 of 9 series · 8 of 17 positions shown · non-contrast
Comparison: Previous exams.
COMPARISON: Previous exams.

Addendum:
CLINICAL DATA: Patient with indeterminate right breast mass and
possible distortion.

EXAM:
RIGHT BREAST STEREOTACTIC CORE NEEDLE BIOPSY

[R (1 of 6)]
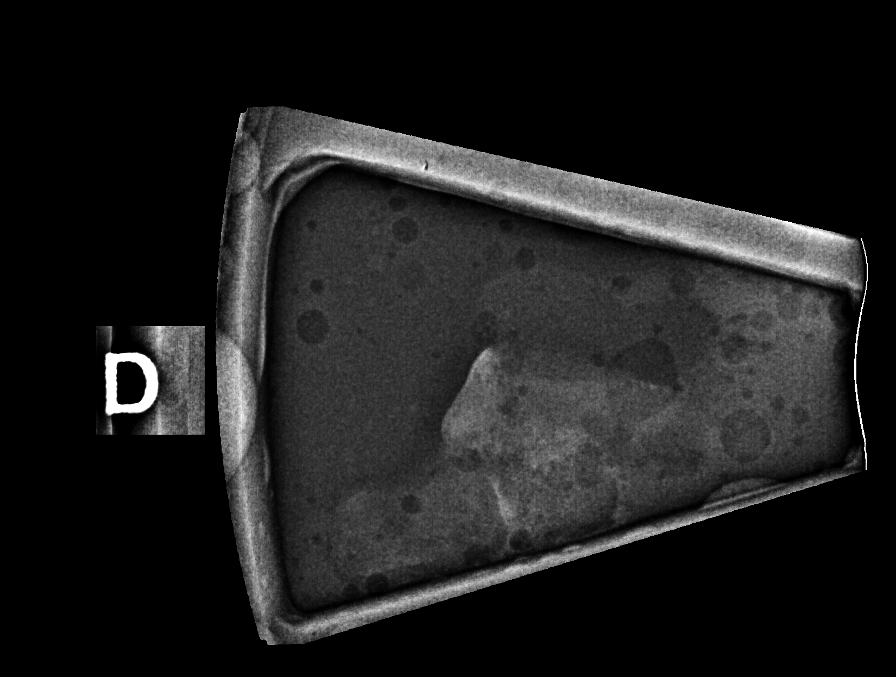

[R (2 of 6)]
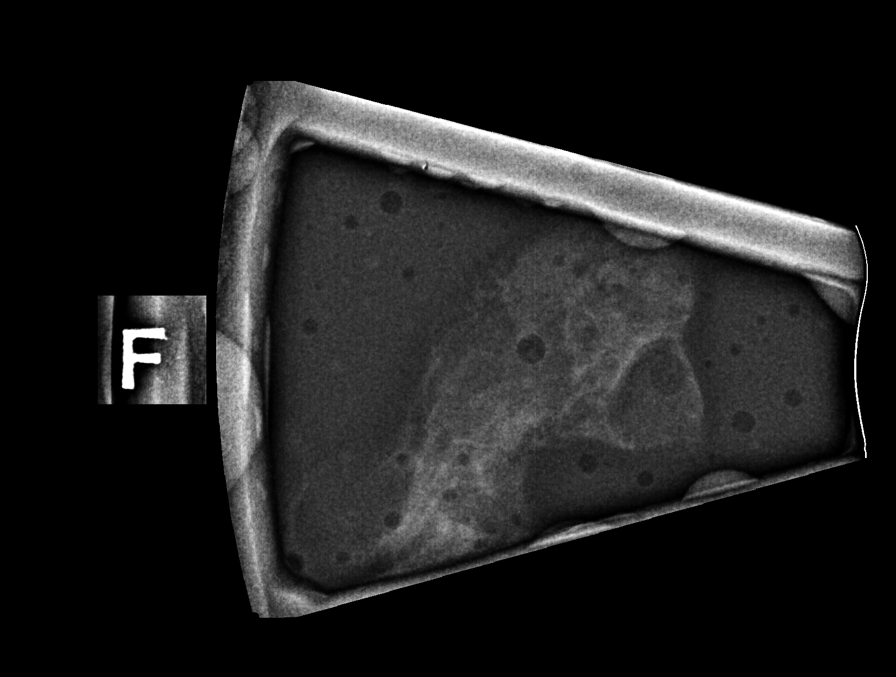

[R (3 of 6)]
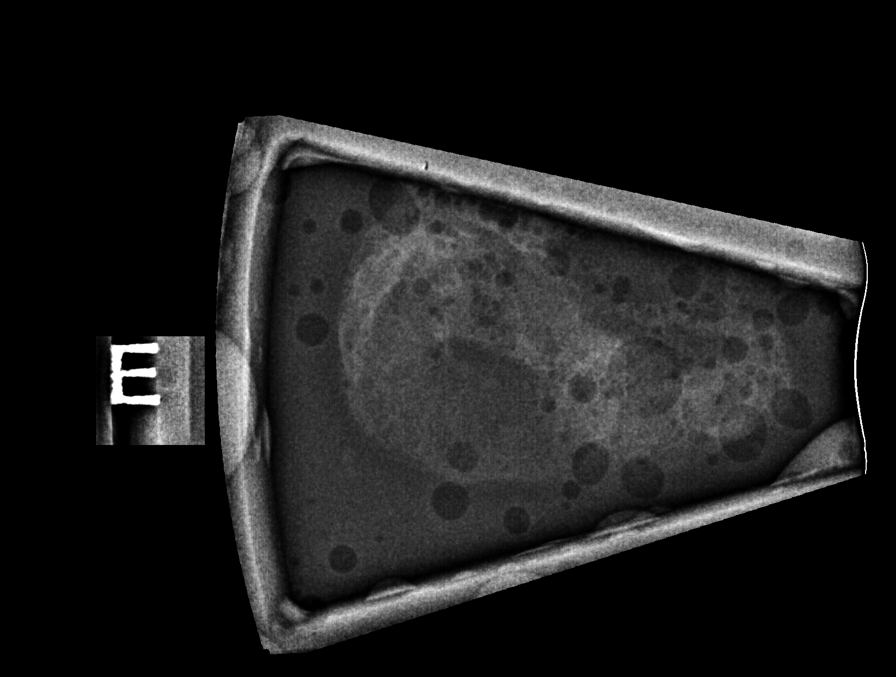

[R (4 of 6)]
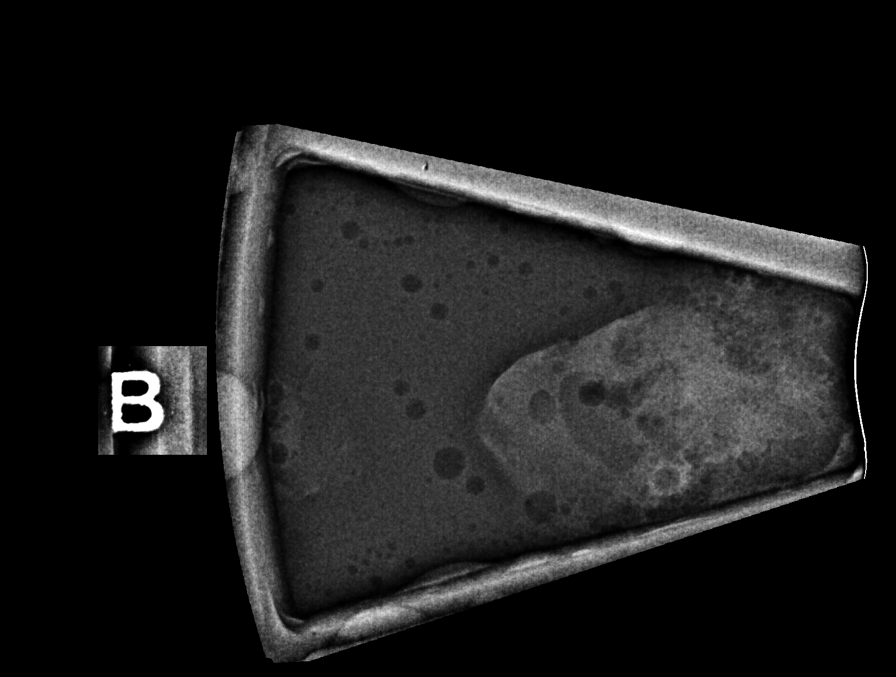

[R (5 of 6)]
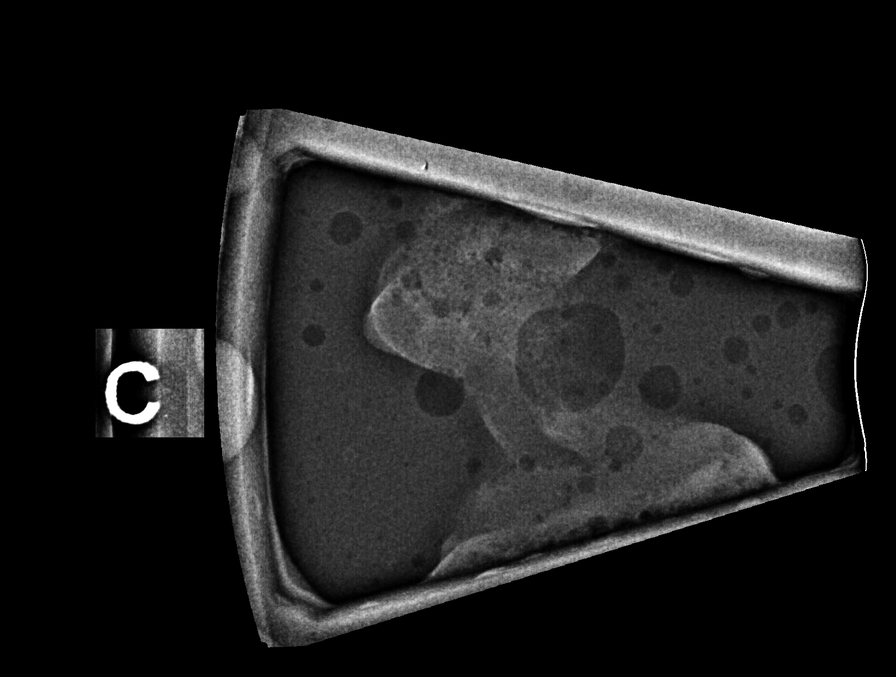

[R (6 of 6)]
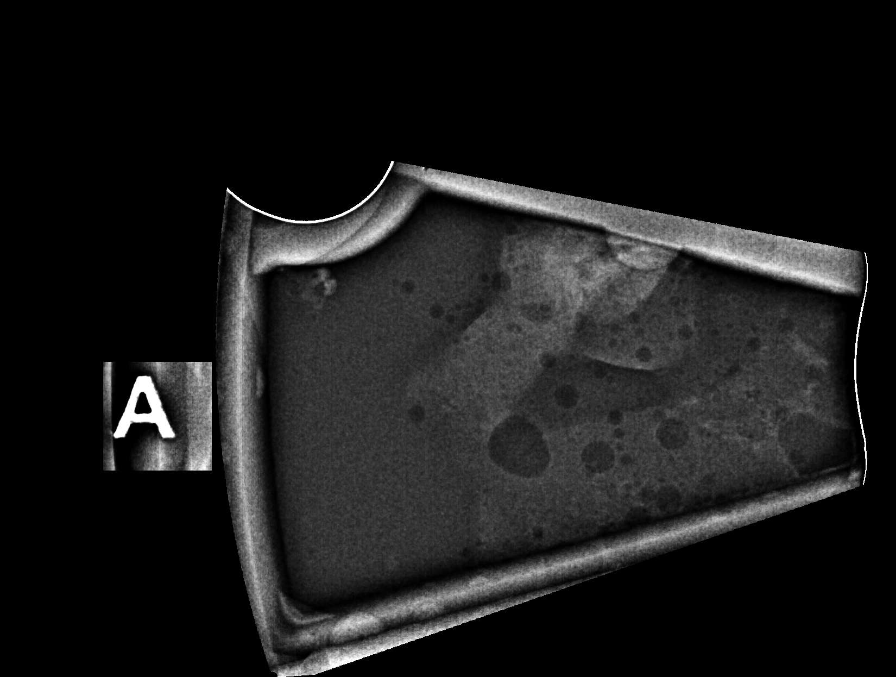

[R CC]
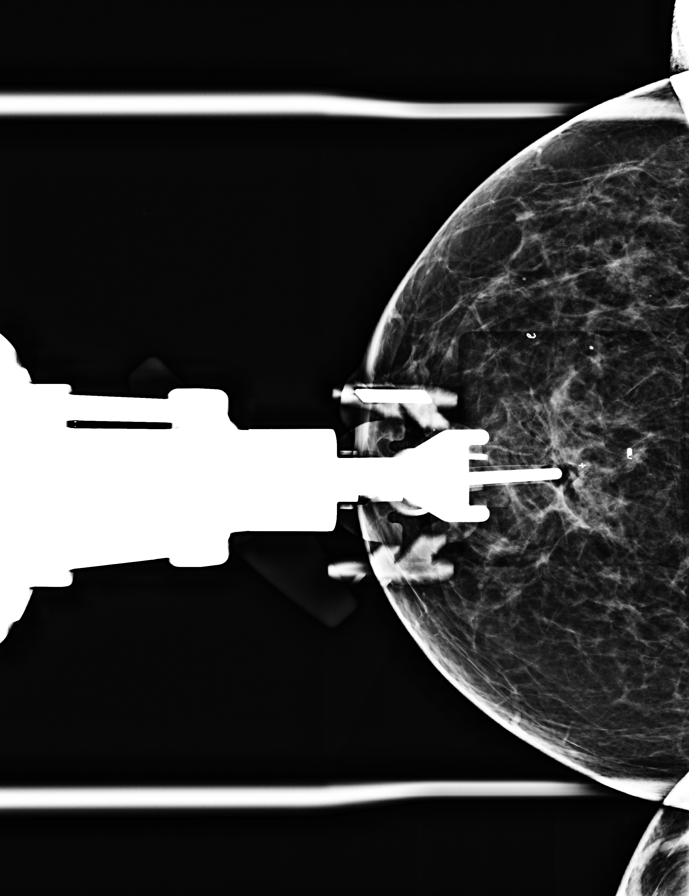

[R CC tomo · tomo slice 33/66.0]
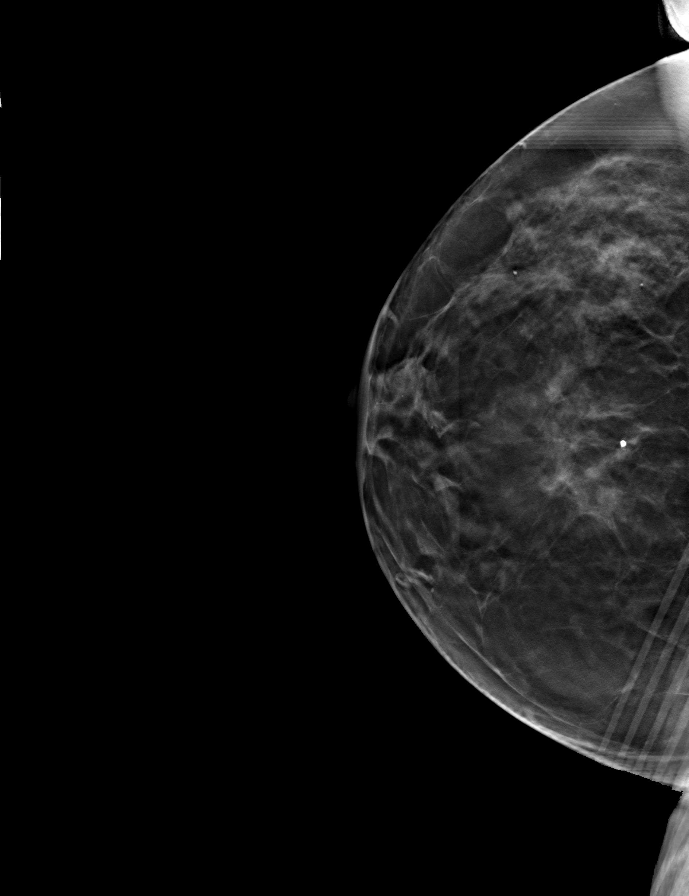

[8 of 17 positions shown; findings below may reference images not displayed]



Using sterile technique and 1% Lidocaine as local anesthetic, under
stereotactic guidance, a 9 gauge vacuum assisted device was used to
perform core needle biopsy of mass and distortion within the
superior anterior right breast using a cranial approach.

Lesion quadrant: Upper inner quadrant

At the conclusion of the procedure, X shaped tissue marker clip was
deployed into the biopsy cavity. Follow-up 2-view mammogram was
performed and dictated separately.
IMPRESSION: Stereotactic-guided biopsy of right breast mass and distortion. No
apparent complications.

ADDENDUM:
PATHOLOGY revealed: A. BREAST, RIGHT SUPERIOR; STEREOTACTIC CORE
NEEDLE BIOPSY: - BENIGN MAMMARY PARENCHYMA WITH STROMAL FIBROSIS,
FIBROCYSTIC CHANGES, AND FOCAL USUAL DUCTAL HYPERPLASIA. - NEGATIVE
FOR ATYPICAL PROLIFERATIVE BREAST DISEASE.

Pathology results are CONCORDANT with imaging findings, per Dr. Matanda
Mallam with surgical excision recommended.

Pathology results and recommendations below were discussed with
patient by telephone with Jaisel Chuang RN on 10/27/2019. Patient
reported biopsy site within normal limits with slight tenderness at
the site. Post biopsy care instructions were reviewed, questions
were answered and my direct phone number was provided to patient.
concerns or questions arise related to the biopsy.

Recommendation: Request for surgical consultation was relayed to
Brennan Aujla RT by Jaisel Chuang RN on 10/27/2019.

Pathology results reported by Opurno Dity RN on 10/27/2019.



Using sterile technique and 1% Lidocaine as local anesthetic, under
stereotactic guidance, a 9 gauge vacuum assisted device was used to
perform core needle biopsy of mass and distortion within the
superior anterior right breast using a cranial approach.

Lesion quadrant: Upper inner quadrant

At the conclusion of the procedure, X shaped tissue marker clip was
deployed into the biopsy cavity. Follow-up 2-view mammogram was
performed and dictated separately.
IMPRESSION: Stereotactic-guided biopsy of right breast mass and distortion. No
apparent complications.

## 2021-07-14 ENCOUNTER — Ambulatory Visit: Payer: Medicare PPO

## 2021-07-21 ENCOUNTER — Ambulatory Visit (INDEPENDENT_AMBULATORY_CARE_PROVIDER_SITE_OTHER): Payer: Medicare PPO

## 2021-07-21 DIAGNOSIS — E538 Deficiency of other specified B group vitamins: Secondary | ICD-10-CM | POA: Diagnosis not present

## 2021-07-22 MED ORDER — CYANOCOBALAMIN 1000 MCG/ML IJ SOLN
1000.0000 ug | Freq: Once | INTRAMUSCULAR | Status: DC
  Administered 2021-07-22: 1000 ug via INTRAMUSCULAR

## 2021-07-22 MED ORDER — CYANOCOBALAMIN 1000 MCG/ML IJ SOLN
1000.0000 ug | Freq: Once | INTRAMUSCULAR | Status: AC
  Administered 2021-07-21: 1000 ug via INTRAMUSCULAR

## 2021-07-28 ENCOUNTER — Other Ambulatory Visit: Payer: Self-pay | Admitting: Internal Medicine

## 2021-07-28 DIAGNOSIS — E782 Mixed hyperlipidemia: Secondary | ICD-10-CM

## 2021-07-31 DIAGNOSIS — C50919 Malignant neoplasm of unspecified site of unspecified female breast: Secondary | ICD-10-CM

## 2021-07-31 HISTORY — DX: Malignant neoplasm of unspecified site of unspecified female breast: C50.919

## 2021-08-06 DIAGNOSIS — L570 Actinic keratosis: Secondary | ICD-10-CM | POA: Diagnosis not present

## 2021-08-06 DIAGNOSIS — Z09 Encounter for follow-up examination after completed treatment for conditions other than malignant neoplasm: Secondary | ICD-10-CM | POA: Diagnosis not present

## 2021-08-06 DIAGNOSIS — X32XXXA Exposure to sunlight, initial encounter: Secondary | ICD-10-CM | POA: Diagnosis not present

## 2021-08-06 DIAGNOSIS — Z872 Personal history of diseases of the skin and subcutaneous tissue: Secondary | ICD-10-CM | POA: Diagnosis not present

## 2021-08-06 DIAGNOSIS — Z85828 Personal history of other malignant neoplasm of skin: Secondary | ICD-10-CM | POA: Diagnosis not present

## 2021-08-06 DIAGNOSIS — L814 Other melanin hyperpigmentation: Secondary | ICD-10-CM | POA: Diagnosis not present

## 2021-08-11 ENCOUNTER — Encounter (HOSPITAL_BASED_OUTPATIENT_CLINIC_OR_DEPARTMENT_OTHER): Payer: Self-pay | Admitting: General Surgery

## 2021-08-11 ENCOUNTER — Other Ambulatory Visit: Payer: Self-pay

## 2021-08-18 ENCOUNTER — Ambulatory Visit: Payer: Medicare PPO

## 2021-08-18 ENCOUNTER — Ambulatory Visit (INDEPENDENT_AMBULATORY_CARE_PROVIDER_SITE_OTHER): Payer: Medicare PPO

## 2021-08-18 DIAGNOSIS — E538 Deficiency of other specified B group vitamins: Secondary | ICD-10-CM | POA: Diagnosis not present

## 2021-08-18 MED ORDER — CYANOCOBALAMIN 1000 MCG/ML IJ SOLN
1000.0000 ug | Freq: Once | INTRAMUSCULAR | Status: AC
  Administered 2021-08-18: 1000 ug via INTRAMUSCULAR

## 2021-08-19 ENCOUNTER — Ambulatory Visit
Admission: RE | Admit: 2021-08-19 | Discharge: 2021-08-19 | Disposition: A | Discharge status: Home / Self Care | Payer: Medicare PPO | Source: Ambulatory Visit | Attending: General Surgery | Admitting: General Surgery

## 2021-08-19 ENCOUNTER — Encounter (HOSPITAL_BASED_OUTPATIENT_CLINIC_OR_DEPARTMENT_OTHER)
Admission: RE | Admit: 2021-08-19 | Discharge: 2021-08-19 | Disposition: A | Discharge status: Home / Self Care | Payer: Medicare PPO | Source: Ambulatory Visit | Attending: General Surgery | Admitting: General Surgery

## 2021-08-19 DIAGNOSIS — Z01810 Encounter for preprocedural cardiovascular examination: Secondary | ICD-10-CM | POA: Diagnosis not present

## 2021-08-19 DIAGNOSIS — N6312 Unspecified lump in the right breast, upper inner quadrant: Secondary | ICD-10-CM

## 2021-08-19 DIAGNOSIS — R928 Other abnormal and inconclusive findings on diagnostic imaging of breast: Secondary | ICD-10-CM | POA: Diagnosis not present

## 2021-08-19 NOTE — Progress Notes (Addendum)
Sent text reminding pt to come in for EKG.  Surgical soap given with instructions, pt verbalized understanding. Enhanced Recovery after Surgery  Enhanced Recovery after Surgery is a protocol used to improve the stress on your body and your recovery after surgery.  Patient Instructions  The night before surgery:  No food after midnight. ONLY clear liquids after midnight  The day of surgery (if you do NOT have diabetes):  Drink ONE (1) Pre-Surgery Clear Ensure as directed.   This drink was given to you during your hospital  pre-op appointment visit. The pre-op nurse will instruct you on the time to drink the  Pre-Surgery Ensure depending on your surgery time. Finish the drink at the designated time by the pre-op nurse.  Nothing else to drink after completing the  Pre-Surgery Clear Ensure.  The day of surgery (if you have diabetes): Drink ONE (1) Gatorade 2 (G2) as directed. This drink was given to you during your hospital  pre-op appointment visit.  The pre-op nurse will instruct you on the time to drink the   Gatorade 2 (G2) depending on your surgery time. Color of the Gatorade may vary. Red is not allowed. Nothing else to drink after completing the  Gatorade 2 (G2).         If office.you have questions, please contact your surgeon's office

## 2021-08-20 ENCOUNTER — Encounter (HOSPITAL_BASED_OUTPATIENT_CLINIC_OR_DEPARTMENT_OTHER): Payer: Self-pay | Admitting: General Surgery

## 2021-08-20 ENCOUNTER — Encounter (HOSPITAL_BASED_OUTPATIENT_CLINIC_OR_DEPARTMENT_OTHER)
Admission: RE | Disposition: A | Discharge status: Home / Self Care | Payer: Self-pay | Source: Home / Self Care | Attending: General Surgery

## 2021-08-20 ENCOUNTER — Ambulatory Visit (HOSPITAL_BASED_OUTPATIENT_CLINIC_OR_DEPARTMENT_OTHER): Payer: Medicare PPO | Admitting: Anesthesiology

## 2021-08-20 ENCOUNTER — Other Ambulatory Visit: Payer: Self-pay

## 2021-08-20 ENCOUNTER — Ambulatory Visit
Admission: RE | Admit: 2021-08-20 | Discharge: 2021-08-20 | Disposition: A | Discharge status: Home / Self Care | Payer: Medicare PPO | Source: Ambulatory Visit | Attending: General Surgery | Admitting: General Surgery

## 2021-08-20 ENCOUNTER — Ambulatory Visit (HOSPITAL_BASED_OUTPATIENT_CLINIC_OR_DEPARTMENT_OTHER)
Admission: RE | Admit: 2021-08-20 | Discharge: 2021-08-20 | Disposition: A | Discharge status: Home / Self Care | Payer: Medicare PPO | Attending: General Surgery | Admitting: General Surgery

## 2021-08-20 DIAGNOSIS — C50911 Malignant neoplasm of unspecified site of right female breast: Secondary | ICD-10-CM | POA: Insufficient documentation

## 2021-08-20 DIAGNOSIS — I10 Essential (primary) hypertension: Secondary | ICD-10-CM | POA: Insufficient documentation

## 2021-08-20 DIAGNOSIS — Z170 Estrogen receptor positive status [ER+]: Secondary | ICD-10-CM | POA: Diagnosis not present

## 2021-08-20 DIAGNOSIS — N6310 Unspecified lump in the right breast, unspecified quadrant: Secondary | ICD-10-CM | POA: Diagnosis not present

## 2021-08-20 DIAGNOSIS — Z01818 Encounter for other preprocedural examination: Secondary | ICD-10-CM

## 2021-08-20 DIAGNOSIS — N6312 Unspecified lump in the right breast, upper inner quadrant: Secondary | ICD-10-CM

## 2021-08-20 DIAGNOSIS — K219 Gastro-esophageal reflux disease without esophagitis: Secondary | ICD-10-CM | POA: Diagnosis not present

## 2021-08-20 DIAGNOSIS — R928 Other abnormal and inconclusive findings on diagnostic imaging of breast: Secondary | ICD-10-CM | POA: Diagnosis not present

## 2021-08-20 DIAGNOSIS — N641 Fat necrosis of breast: Secondary | ICD-10-CM | POA: Diagnosis not present

## 2021-08-20 HISTORY — DX: Unspecified osteoarthritis, unspecified site: M19.90

## 2021-08-20 HISTORY — PX: BREAST EXCISIONAL BIOPSY: SUR124

## 2021-08-20 HISTORY — DX: Unspecified lump in the right breast, unspecified quadrant: N63.10

## 2021-08-20 HISTORY — DX: Gastro-esophageal reflux disease without esophagitis: K21.9

## 2021-08-20 SURGERY — BREAST LUMPECTOMY WITH RADIOACTIVE SEED LOCALIZATION
Anesthesia: General | Site: Breast | Laterality: Right

## 2021-08-20 MED ORDER — CHLORHEXIDINE GLUCONATE CLOTH 2 % EX PADS: 6.0000 | MEDICATED_PAD | Freq: Once | CUTANEOUS | Status: DC

## 2021-08-20 MED ORDER — MEPERIDINE HCL 25 MG/ML IJ SOLN: 6.2500 mg | INTRAMUSCULAR | Status: DC | PRN

## 2021-08-20 MED ORDER — LIDOCAINE 2% (20 MG/ML) 5 ML SYRINGE
INTRAMUSCULAR | Status: AC
Start: 2021-08-20 — End: ?
  Filled 2021-08-20: qty 5

## 2021-08-20 MED ORDER — EPHEDRINE SULFATE (PRESSORS) 50 MG/ML IJ SOLN
INTRAMUSCULAR | Status: DC | PRN
  Administered 2021-08-20: 5 mg via INTRAVENOUS

## 2021-08-20 MED ORDER — LACTATED RINGERS IV SOLN: INTRAVENOUS | Status: DC

## 2021-08-20 MED ORDER — ACETAMINOPHEN 500 MG PO TABS
1000.0000 mg | ORAL_TABLET | ORAL | Status: AC
  Administered 2021-08-20: 1000 mg via ORAL

## 2021-08-20 MED ORDER — OXYCODONE HCL 5 MG PO TABS: 5.0000 mg | ORAL_TABLET | Freq: Four times a day (QID) | ORAL | 0 refills | Status: DC | PRN

## 2021-08-20 MED ORDER — HYDROMORPHONE HCL 1 MG/ML IJ SOLN: 0.2500 mg | INTRAMUSCULAR | Status: DC | PRN

## 2021-08-20 MED ORDER — LIDOCAINE HCL (CARDIAC) PF 100 MG/5ML IV SOSY
PREFILLED_SYRINGE | INTRAVENOUS | Status: DC | PRN
  Administered 2021-08-20: 60 mg via INTRAVENOUS

## 2021-08-20 MED ORDER — CEFAZOLIN SODIUM-DEXTROSE 2-4 GM/100ML-% IV SOLN
INTRAVENOUS | Status: AC
  Filled 2021-08-20: qty 100

## 2021-08-20 MED ORDER — BUPIVACAINE-EPINEPHRINE (PF) 0.25% -1:200000 IJ SOLN
INTRAMUSCULAR | Status: DC | PRN
  Administered 2021-08-20: 20 mL

## 2021-08-20 MED ORDER — DEXAMETHASONE SODIUM PHOSPHATE 4 MG/ML IJ SOLN
INTRAMUSCULAR | Status: DC | PRN
  Administered 2021-08-20: 4 mg via INTRAVENOUS

## 2021-08-20 MED ORDER — OXYCODONE HCL 5 MG PO TABS: 5.0000 mg | ORAL_TABLET | Freq: Once | ORAL | Status: DC | PRN

## 2021-08-20 MED ORDER — ACETAMINOPHEN 500 MG PO TABS
ORAL_TABLET | ORAL | Status: AC
  Filled 2021-08-20: qty 2

## 2021-08-20 MED ORDER — ONDANSETRON HCL 4 MG/2ML IJ SOLN
INTRAMUSCULAR | Status: DC | PRN
  Administered 2021-08-20: 4 mg via INTRAVENOUS

## 2021-08-20 MED ORDER — FENTANYL CITRATE (PF) 100 MCG/2ML IJ SOLN
INTRAMUSCULAR | Status: DC | PRN
  Administered 2021-08-20: 50 ug via INTRAVENOUS

## 2021-08-20 MED ORDER — CELECOXIB 200 MG PO CAPS
ORAL_CAPSULE | ORAL | Status: AC
  Filled 2021-08-20: qty 1

## 2021-08-20 MED ORDER — GABAPENTIN 300 MG PO CAPS
300.0000 mg | ORAL_CAPSULE | ORAL | Status: AC
  Administered 2021-08-20: 300 mg via ORAL

## 2021-08-20 MED ORDER — PROPOFOL 10 MG/ML IV BOLUS
INTRAVENOUS | Status: DC | PRN
  Administered 2021-08-20: 200 mg via INTRAVENOUS

## 2021-08-20 MED ORDER — CEFAZOLIN SODIUM-DEXTROSE 2-4 GM/100ML-% IV SOLN
2.0000 g | INTRAVENOUS | Status: AC
  Administered 2021-08-20: 2 g via INTRAVENOUS

## 2021-08-20 MED ORDER — GABAPENTIN 300 MG PO CAPS
ORAL_CAPSULE | ORAL | Status: AC
  Filled 2021-08-20: qty 1

## 2021-08-20 MED ORDER — PROMETHAZINE HCL 25 MG/ML IJ SOLN: 6.2500 mg | INTRAMUSCULAR | Status: DC | PRN

## 2021-08-20 MED ORDER — PROPOFOL 10 MG/ML IV BOLUS
INTRAVENOUS | Status: AC
  Filled 2021-08-20: qty 20

## 2021-08-20 MED ORDER — OXYCODONE HCL 5 MG/5ML PO SOLN: 5.0000 mg | Freq: Once | ORAL | Status: DC | PRN

## 2021-08-20 MED ORDER — DEXAMETHASONE SODIUM PHOSPHATE 10 MG/ML IJ SOLN
INTRAMUSCULAR | Status: AC
  Filled 2021-08-20: qty 1

## 2021-08-20 MED ORDER — FENTANYL CITRATE (PF) 100 MCG/2ML IJ SOLN
INTRAMUSCULAR | Status: AC
  Filled 2021-08-20: qty 2

## 2021-08-20 MED ORDER — CELECOXIB 200 MG PO CAPS
200.0000 mg | ORAL_CAPSULE | ORAL | Status: AC
  Administered 2021-08-20: 200 mg via ORAL

## 2021-08-20 MED ORDER — ONDANSETRON HCL 4 MG/2ML IJ SOLN
INTRAMUSCULAR | Status: AC
  Filled 2021-08-20: qty 2

## 2021-08-20 MED ORDER — MIDAZOLAM HCL 2 MG/2ML IJ SOLN
INTRAMUSCULAR | Status: AC
  Filled 2021-08-20: qty 2

## 2021-08-20 MED ORDER — AMISULPRIDE (ANTIEMETIC) 5 MG/2ML IV SOLN: 10.0000 mg | Freq: Once | INTRAVENOUS | Status: DC | PRN

## 2021-08-20 MED ORDER — MIDAZOLAM HCL 5 MG/5ML IJ SOLN
INTRAMUSCULAR | Status: DC | PRN
  Administered 2021-08-20: 1 mg via INTRAVENOUS

## 2021-08-20 SURGICAL SUPPLY — 42 items
ADH SKN CLS APL DERMABOND .7 (GAUZE/BANDAGES/DRESSINGS) ×1
APL PRP STRL LF DISP 70% ISPRP (MISCELLANEOUS) ×1
APPLIER CLIP 9.375 MED OPEN (MISCELLANEOUS)
APR CLP MED 9.3 20 MLT OPN (MISCELLANEOUS)
BLADE SURG 15 STRL LF DISP TIS (BLADE) ×2 IMPLANT
BLADE SURG 15 STRL SS (BLADE) ×2
CANISTER SUC SOCK COL 7IN (MISCELLANEOUS) ×3 IMPLANT
CANISTER SUCT 1200ML W/VALVE (MISCELLANEOUS) ×3 IMPLANT
CHLORAPREP W/TINT 26 (MISCELLANEOUS) ×3 IMPLANT
CLIP APPLIE 9.375 MED OPEN (MISCELLANEOUS) IMPLANT
COVER BACK TABLE 60X90IN (DRAPES) ×3 IMPLANT
COVER MAYO STAND STRL (DRAPES) ×3 IMPLANT
COVER PROBE W GEL 5X96 (DRAPES) ×3 IMPLANT
DERMABOND ADVANCED (GAUZE/BANDAGES/DRESSINGS) ×1
DERMABOND ADVANCED .7 DNX12 (GAUZE/BANDAGES/DRESSINGS) ×2 IMPLANT
DRAPE LAPAROSCOPIC ABDOMINAL (DRAPES) ×3 IMPLANT
DRAPE UTILITY XL STRL (DRAPES) ×3 IMPLANT
ELECT COATED BLADE 2.86 ST (ELECTRODE) ×3 IMPLANT
ELECT REM PT RETURN 9FT ADLT (ELECTROSURGICAL) ×2
ELECTRODE REM PT RTRN 9FT ADLT (ELECTROSURGICAL) ×2 IMPLANT
GLOVE BIO SURGEON STRL SZ7.5 (GLOVE) ×6 IMPLANT
GOWN STRL REUS W/ TWL LRG LVL3 (GOWN DISPOSABLE) ×4 IMPLANT
GOWN STRL REUS W/TWL LRG LVL3 (GOWN DISPOSABLE) ×4
ILLUMINATOR WAVEGUIDE N/F (MISCELLANEOUS) IMPLANT
KIT MARKER MARGIN INK (KITS) ×3 IMPLANT
LIGHT WAVEGUIDE WIDE FLAT (MISCELLANEOUS) IMPLANT
NDL HYPO 25X1 1.5 SAFETY (NEEDLE) IMPLANT
NEEDLE HYPO 25X1 1.5 SAFETY (NEEDLE) IMPLANT
NS IRRIG 1000ML POUR BTL (IV SOLUTION) IMPLANT
PACK BASIN DAY SURGERY FS (CUSTOM PROCEDURE TRAY) ×3 IMPLANT
PENCIL SMOKE EVACUATOR (MISCELLANEOUS) ×3 IMPLANT
SLEEVE SCD COMPRESS KNEE MED (STOCKING) ×3 IMPLANT
SPIKE FLUID TRANSFER (MISCELLANEOUS) IMPLANT
SPONGE T-LAP 18X18 ~~LOC~~+RFID (SPONGE) ×3 IMPLANT
SUT MON AB 4-0 PC3 18 (SUTURE) ×3 IMPLANT
SUT SILK 2 0 SH (SUTURE) IMPLANT
SUT VICRYL 3-0 CR8 SH (SUTURE) ×3 IMPLANT
SYR CONTROL 10ML LL (SYRINGE) IMPLANT
TOWEL GREEN STERILE FF (TOWEL DISPOSABLE) ×3 IMPLANT
TRAY FAXITRON CT DISP (TRAY / TRAY PROCEDURE) ×3 IMPLANT
TUBE CONNECTING 20X1/4 (TUBING) ×3 IMPLANT
YANKAUER SUCT BULB TIP NO VENT (SUCTIONS) IMPLANT

## 2021-08-20 NOTE — Anesthesia Preprocedure Evaluation (Signed)
Anesthesia Evaluation  Patient identified by MRN, date of birth, ID band Patient awake    Reviewed: Allergy & Precautions, NPO status , Patient's Chart, lab work & pertinent test results  Airway Mallampati: II  TM Distance: >3 FB Neck ROM: Full    Dental no notable dental hx.    Pulmonary neg pulmonary ROS,    Pulmonary exam normal breath sounds clear to auscultation       Cardiovascular hypertension, Pt. on medications negative cardio ROS Normal cardiovascular exam Rhythm:Regular Rate:Normal     Neuro/Psych negative neurological ROS  negative psych ROS   GI/Hepatic Neg liver ROS, GERD  ,  Endo/Other  negative endocrine ROS  Renal/GU negative Renal ROS  negative genitourinary   Musculoskeletal  (+) Arthritis , Osteoarthritis,    Abdominal   Peds negative pediatric ROS (+)  Hematology negative hematology ROS (+)   Anesthesia Other Findings   Reproductive/Obstetrics negative OB ROS                             Anesthesia Physical Anesthesia Plan  ASA: 2  Anesthesia Plan: General   Post-op Pain Management:    Induction: Intravenous  PONV Risk Score and Plan: 3  Airway Management Planned: LMA  Additional Equipment:   Intra-op Plan:   Post-operative Plan: Extubation in OR  Informed Consent: I have reviewed the patients History and Physical, chart, labs and discussed the procedure including the risks, benefits and alternatives for the proposed anesthesia with the patient or authorized representative who has indicated his/her understanding and acceptance.     Dental advisory given  Plan Discussed with: CRNA  Anesthesia Plan Comments:         Anesthesia Quick Evaluation

## 2021-08-20 NOTE — Anesthesia Procedure Notes (Signed)
Procedure Name: LMA Insertion Date/Time: 08/20/2021 9:36 AM  Performed by: Maryella Shivers, CRNAPre-anesthesia Checklist: Patient identified, Emergency Drugs available, Suction available and Patient being monitored Patient Re-evaluated:Patient Re-evaluated prior to induction Oxygen Delivery Method: Circle system utilized Preoxygenation: Pre-oxygenation with 100% oxygen Induction Type: IV induction Ventilation: Mask ventilation without difficulty LMA: LMA inserted LMA Size: 4.0 Number of attempts: 1 Airway Equipment and Method: Bite block Placement Confirmation: positive ETCO2 Tube secured with: Tape Dental Injury: Teeth and Oropharynx as per pre-operative assessment

## 2021-08-20 NOTE — Op Note (Signed)
08/20/2021  10:16 AM  PATIENT:  Kristen Reid  71 y.o. female  PRE-OPERATIVE DIAGNOSIS:  RIGHT BREAST MASS  POST-OPERATIVE DIAGNOSIS:  RIGHT BREAST MASS  PROCEDURE:  Procedure(s): RIGHT BREAST LUMPECTOMY WITH RADIOACTIVE SEED LOCALIZATION (Right)  SURGEON:  Surgeon(s) and Role:    * Jovita Kussmaul, MD - Primary  PHYSICIAN ASSISTANT:   ASSISTANTS: none   ANESTHESIA:   local and general  EBL:  minimal   BLOOD ADMINISTERED:none  DRAINS: none   LOCAL MEDICATIONS USED:  MARCAINE     SPECIMEN:  Source of Specimen:  right breast tissue with additional superior margin  DISPOSITION OF SPECIMEN:  PATHOLOGY  COUNTS:  YES  TOURNIQUET:  * No tourniquets in log *  DICTATION: .Dragon Dictation  After informed consent was obtained the patient was brought to the operating room and placed in the supine position on the operating table.  After adequate induction of general anesthesia the patient's right breast was prepped with ChloraPrep, allowed to dry, and draped in usual sterile manner.  An appropriate timeout was performed.  Previously an I-125 seed was placed in the upper inner quadrant of the right breast to mark an area of benign mass that was enlarging.  The neoprobe was set to I-125 in the area of radioactivity was readily identified.  The area around this was infiltrated with quarter percent Marcaine.  A curvilinear incision was made along the upper inner edge of the areola of the right breast with a 15 blade knife.  The incision was carried through the skin and subcutaneous tissue sharply with the electrocautery.  Dissection was then carried sharply through the breast under the direction of the neoprobe.  Once I more closely approached the radioactive seed I then removed a circular portion of breast tissue sharply with the electrocautery around the radioactive seed while checking the area of radioactivity frequently.  Once the specimen was removed it was oriented with the  appropriate paint colors.  A specimen radiograph was obtained that showed the seed but no clip.  On reviewing the images the clip appeared to be superior and medial to the seed.  I took an additional superior medial margin and a specimen radiograph did show the clip to be within the specimen.  The 2 pieces of tissue were then sent to pathology for further evaluation.  Hemostasis was achieved using the Bovie electrocautery.  The wound was irrigated with saline and infiltrated with more quarter percent Marcaine.  The deep layer of the incision was then closed with layers of interrupted 3-0 Vicryl stitches.  The skin was then closed with interrupted 4-0 Monocryl subcuticular stitches.  Dermabond dressings were applied.  The patient tolerated the procedure well.  At the end of the case all needle sponge and instrument counts were correct.  The patient was then awakened and taken to recovery in stable condition.  PLAN OF CARE: Discharge to home after PACU  PATIENT DISPOSITION:  PACU - hemodynamically stable.   Delay start of Pharmacological VTE agent (>24hrs) due to surgical blood loss or risk of bleeding: not applicable

## 2021-08-20 NOTE — Discharge Instructions (Signed)
  Post Anesthesia Home Care Instructions  Activity: Get plenty of rest for the remainder of the day. A responsible individual must stay with you for 24 hours following the procedure.  For the next 24 hours, DO NOT: -Drive a car -Paediatric nurse -Drink alcoholic beverages -Take any medication unless instructed by your physician -Make any legal decisions or sign important papers.  Meals: Start with liquid foods such as gelatin or soup. Progress to regular foods as tolerated. Avoid greasy, spicy, heavy foods. If nausea and/or vomiting occur, drink only clear liquids until the nausea and/or vomiting subsides. Call your physician if vomiting continues.  Special Instructions/Symptoms: Your throat may feel dry or sore from the anesthesia or the breathing tube placed in your throat during surgery. If this causes discomfort, gargle with warm salt water. The discomfort should disappear within 24 hours.  If you had a scopolamine patch placed behind your ear for the management of post- operative nausea and/or vomiting:  1. The medication in the patch is effective for 72 hours, after which it should be removed.  Wrap patch in a tissue and discard in the trash. Wash hands thoroughly with soap and water. 2. You may remove the patch earlier than 72 hours if you experience unpleasant side effects which may include dry mouth, dizziness or visual disturbances. 3. Avoid touching the patch. Wash your hands with soap and water after contact with the patch.      No Tylenol or Ibuprofen until after 2:00pm today.

## 2021-08-20 NOTE — Transfer of Care (Signed)
Immediate Anesthesia Transfer of Care Note  Patient: Kristen Reid  Procedure(s) Performed: RIGHT BREAST LUMPECTOMY WITH RADIOACTIVE SEED LOCALIZATION (Right: Breast)  Patient Location: PACU  Anesthesia Type:General  Level of Consciousness: awake, alert  and oriented  Airway & Oxygen Therapy: Patient Spontanous Breathing and Patient connected to face mask oxygen  Post-op Assessment: Report given to RN and Post -op Vital signs reviewed and stable  Post vital signs: Reviewed and stable  Last Vitals:  Vitals Value Taken Time  BP 136/62 08/20/21 1020  Temp 36.4 C 08/20/21 1020  Pulse 69 08/20/21 1024  Resp 16 08/20/21 1024  SpO2 94 % 08/20/21 1024  Vitals shown include unvalidated device data.  Last Pain:  Vitals:   08/20/21 1020  TempSrc:   PainSc: 0-No pain      Patients Stated Pain Goal: 4 (24/81/85 9093)  Complications: No notable events documented.

## 2021-08-20 NOTE — Anesthesia Postprocedure Evaluation (Signed)
Anesthesia Post Note  Patient: Kristen Reid  Procedure(s) Performed: RIGHT BREAST LUMPECTOMY WITH RADIOACTIVE SEED LOCALIZATION (Right: Breast)     Patient location during evaluation: PACU Anesthesia Type: General Level of consciousness: awake and alert Pain management: pain level controlled Vital Signs Assessment: post-procedure vital signs reviewed and stable Respiratory status: spontaneous breathing, nonlabored ventilation and respiratory function stable Cardiovascular status: blood pressure returned to baseline and stable Postop Assessment: no apparent nausea or vomiting Anesthetic complications: no   No notable events documented.  Last Vitals:  Vitals:   08/20/21 1045 08/20/21 1058  BP: (!) 144/69 (!) 146/70  Pulse: 67 63  Resp: 18 16  Temp:  36.6 C  SpO2: 96% 94%    Last Pain:  Vitals:   08/20/21 1058  TempSrc:   PainSc: 2                  Lynda Rainwater

## 2021-08-20 NOTE — H&P (Signed)
REFERRING PHYSICIAN: Erich Montane*  PROVIDER: Landry Corporal, MD  MRN: [id number] DOB: 1950/10/16 Subjective   Chief Complaint: No chief complaint on file.   History of Present Illness: Kristen Reid is a 71 y.o. female who is seen today as an office consultation for evaluation of No chief complaint on file. .   We are asked to see the patient in consultation by Dr. Valetta Fuller to evaluate her for a right breast mass. The patient is a 71 year old white female who initially was found to have a small mass in the upper inner quadrant of the right breast on her mammogram last year. This was biopsied and came back benign. She returned for follow-up mammogram and was found to still have the mass present in the upper inner quadrant and it now measures slightly larger at 7 mm. The lymph nodes looked normal. She is otherwise in good health and does not smoke.  Review of Systems: A complete review of systems was obtained from the patient. I have reviewed this information and discussed as appropriate with the patient. See HPI as well for other ROS.  ROS   Medical History: Past Medical History:  Diagnosis Date   Arthritis   GERD (gastroesophageal reflux disease)   Hyperlipidemia   Hypertension   Thyroid disease   Patient Active Problem List  Diagnosis   Mass of upper inner quadrant of right breast   Past Surgical History:  Procedure Laterality Date   COLONOSCOPY 12/20/2007  Normal: [facility] 11/2017 Recall ltr mailed 11/09/17 (kj)   CORNEAL EYE SURGERY Right 01/05/2017  [facility], [place], Dr. Lyndel Safe   COLONOSCOPY 02/17/2018  Hyperplastic Polyp: [facility] 01/2028   CATARACT EXTRACTION Bilateral   CESAREAN SECTION   TONSILLECTOMY    No Known Allergies  Current Outpatient Medications on File Prior to Visit  Medication Sig Dispense Refill   apple cider vinegar 300 mg Tab Take by mouth   calcium carbonate 600 mg calcium (1,500 mg) Tab tablet Take by mouth   cholecalciferol (VITAMIN D3)  2,000 unit capsule Take 2,000 Units by mouth once daily.   clobetasol (CORMAX) 0.05 % external solution APPLY TO AFFECTED AREA EVERY DAY AT BEDTIME 3   dutasteride (AVODART) 0.5 mg capsule TAKE ONE PILL DAILY X 1 WEEK, THEN ONE PILL ONCE WEEKLY 6   famotidine (PEPCID) 20 MG tablet Pepcid 20 mg tablet 2 tablets as needed by oral route.   FLAXSEED OIL ORAL Take by mouth.   flaxseed-omega3,6,9-fatty acid 1,300-670-155 mg Cap Take by mouth   loratadine (CLARITIN) 10 mg tablet Take 10 mg by mouth once daily.   losartan (COZAAR) 25 MG tablet Take 25 mg by mouth once daily   MILK THISTLE ORAL Take by mouth   rosuvastatin (CRESTOR) 5 MG tablet Take 5 mg by mouth once daily   ubidecarenone/vitamin E mixed (COQ10 SG 100 ORAL) Take by mouth.   XIIDRA 5 % ophthalmic solution INSTILL 1 DROP INTO BOTH EYES TWICE A DAY 60 mL 11   Lactobac 40-Bifido 3-S.thermop 100 billion cell Cap Take by mouth.   omeprazole (PRILOSEC) 20 MG DR capsule Take by mouth (Patient not taking: Reported on 06/27/2021)   No current facility-administered medications on file prior to visit.   Family History  Problem Relation Age of Onset   Obesity Sister   High blood pressure (Hypertension) Sister   Hyperlipidemia (Elevated cholesterol) Sister   Skin cancer Brother   Obesity Brother   High blood pressure (Hypertension) Brother   Hyperlipidemia (Elevated cholesterol) Brother  Diabetes Brother   Glaucoma Paternal Aunt   Cataracts Maternal Grandmother   Macular degeneration Neg Hx    Social History   Tobacco Use  Smoking Status Never  Smokeless Tobacco Never    Social History   Socioeconomic History   Marital status: Married  Tobacco Use   Smoking status: Never   Smokeless tobacco: Never  Vaping Use   Vaping Use: Never used  Substance and Sexual Activity   Alcohol use: Yes   Drug use: Never   Objective:   Vitals:  BP: 138/80  Pulse: 80  Temp: 36.8 C (98.2 F)  SpO2: 95%  Weight: 69.9 kg (154 lb 3.2  oz)  Height: 157.5 cm ('5\' 2"'$ )   Body mass index is 28.2 kg/m.  Physical Exam Vitals reviewed.  Constitutional:  General: She is not in acute distress. Appearance: Normal appearance.  HENT:  Head: Normocephalic and atraumatic.  Right Ear: External ear normal.  Left Ear: External ear normal.  Nose: Nose normal.  Mouth/Throat:  Mouth: Mucous membranes are moist.  Pharynx: Oropharynx is clear.  Eyes:  General: No scleral icterus. Extraocular Movements: Extraocular movements intact.  Conjunctiva/sclera: Conjunctivae normal.  Pupils: Pupils are equal, round, and reactive to light.  Cardiovascular:  Rate and Rhythm: Normal rate and regular rhythm.  Pulses: Normal pulses.  Heart sounds: Normal heart sounds.  Pulmonary:  Effort: Pulmonary effort is normal. No respiratory distress.  Breath sounds: Normal breath sounds.  Abdominal:  General: Bowel sounds are normal.  Palpations: Abdomen is soft.  Tenderness: There is no abdominal tenderness.  Musculoskeletal:  General: No swelling, tenderness or deformity. Normal range of motion.  Cervical back: Normal range of motion and neck supple.  Skin: General: Skin is warm and dry.  Coloration: Skin is not jaundiced.  Neurological:  General: No focal deficit present.  Mental Status: She is alert and oriented to person, place, and time.  Psychiatric:  Mood and Affect: Mood normal.  Behavior: Behavior normal.     Breast: There is no palpable mass in either breast. There is no palpable axillary, supraclavicular, or cervical lymphadenopathy.  Labs, Imaging and Diagnostic Testing:  Assessment and Plan:   Diagnoses and all orders for this visit:  Mass of upper inner quadrant of right breast - [facility] Case Posting Request; Future    The patient has what appears to be a 7 mm mass in the upper inner quadrant of the right breast that has been benign on previous biopsy but is continuing to enlarge. Because of this I feel it is reasonable  to remove the area. I have discussed with her in detail the risks and benefits of the operation as well as some of the technical aspects including the use of a radioactive seed for localization and she understands and wishes to proceed. She is getting ready to go out of the country and would like to plan it for after her return and I feel like this is also very reasonable.

## 2021-08-20 NOTE — Interval H&P Note (Signed)
History and Physical Interval Note:  08/20/2021 8:29 AM  Kristen Reid  has presented today for surgery, with the diagnosis of RIGHT BREAST MASS.  The various methods of treatment have been discussed with the patient and family. After consideration of risks, benefits and other options for treatment, the patient has consented to  Procedure(s): RIGHT BREAST LUMPECTOMY WITH RADIOACTIVE SEED LOCALIZATION (Right) as a surgical intervention.  The patient's history has been reviewed, patient examined, no change in status, stable for surgery.  I have reviewed the patient's chart and labs.  Questions were answered to the patient's satisfaction.     [name]

## 2021-08-21 ENCOUNTER — Encounter (HOSPITAL_BASED_OUTPATIENT_CLINIC_OR_DEPARTMENT_OTHER): Payer: Self-pay | Admitting: General Surgery

## 2021-08-29 ENCOUNTER — Encounter (HOSPITAL_COMMUNITY): Payer: Self-pay

## 2021-08-29 LAB — SURGICAL PATHOLOGY

## 2021-09-09 ENCOUNTER — Ambulatory Visit: Payer: Medicare PPO | Admitting: Nurse Practitioner

## 2021-09-11 ENCOUNTER — Ambulatory Visit
Admission: RE | Admit: 2021-09-11 | Discharge: 2021-09-11 | Disposition: A | Discharge status: Home / Self Care | Payer: Medicare PPO | Source: Ambulatory Visit | Attending: Radiation Oncology | Admitting: Radiation Oncology

## 2021-09-11 VITALS — BP 154/79 | HR 63 | Temp 95.7°F | Ht 62.0 in | Wt 154.8 lb

## 2021-09-11 DIAGNOSIS — I10 Essential (primary) hypertension: Secondary | ICD-10-CM | POA: Diagnosis not present

## 2021-09-11 DIAGNOSIS — E785 Hyperlipidemia, unspecified: Secondary | ICD-10-CM | POA: Insufficient documentation

## 2021-09-11 DIAGNOSIS — C50211 Malignant neoplasm of upper-inner quadrant of right female breast: Secondary | ICD-10-CM | POA: Insufficient documentation

## 2021-09-11 DIAGNOSIS — Z170 Estrogen receptor positive status [ER+]: Secondary | ICD-10-CM | POA: Diagnosis not present

## 2021-09-11 DIAGNOSIS — K219 Gastro-esophageal reflux disease without esophagitis: Secondary | ICD-10-CM | POA: Diagnosis not present

## 2021-09-11 DIAGNOSIS — M129 Arthropathy, unspecified: Secondary | ICD-10-CM | POA: Insufficient documentation

## 2021-09-11 DIAGNOSIS — Z79899 Other long term (current) drug therapy: Secondary | ICD-10-CM | POA: Diagnosis not present

## 2021-09-11 NOTE — Consult Note (Signed)
NEW PATIENT EVALUATION  Name: Kristen Reid  MRN: [id number]  Date:   09/11/2021     DOB: [birth year]   This 71 y.o. female patient presents to the clinic for initial evaluation of stage T1 a NX M0) invasive mammary carcinoma.  Of the right breast status post wide local excision with no sentinel node biopsy  REFERRING PHYSICIAN: Jonetta Osgood, NP  CHIEF COMPLAINT:  Chief Complaint  Patient presents with   Breast Cancer    Consult    DIAGNOSIS: The encounter diagnosis was Malignant neoplasm of upper-inner quadrant of right female breast, unspecified estrogen receptor status ([place]).   PREVIOUS INVESTIGATIONS:  Mammogram ultrasound reviewed Pathology reports reviewed Clinical notes reviewed  HPI: Patient is a 71 year old female followed    for approximately a year for some architectural distortion in the right breast which was eventually biopsied and was benign.  In follow-up mammograms were slight increase in size of the distorted mass it was decided to proceed with surgical excision.  Wide local excision showed a grade 2 invasive mammary carcinoma measuring 1.1 x 0.8 x 0.8 cm.  Ductal carcinoma in situ intermediate grade was also present.  Margins were both were clear at at 1 mm.  No sentinel node or axillary dissection was performed.  Tumor was ER/PR positive HER2/neu not overexpressed.  She has done well postoperatively.  Has yet not had a medical oncology consultation.  She specifically denies breast tenderness cough or bone pain.  PLANNED TREATMENT REGIMEN: Further work-up including Oncotype DX and possible sentinel node biopsy  PAST MEDICAL HISTORY:  has a past medical history of Arthritis, Breast mass, right, Cataract, GERD (gastroesophageal reflux disease), Hyperlipidemia, and Hypertension.    PAST SURGICAL HISTORY:  Past Surgical History:  Procedure Laterality Date   BREAST BIOPSY Right 2010 and 04/18/2015   BREAST BIOPSY Right 10/26/2019   x clip path pending mass  w/ distortion   BREAST LUMPECTOMY WITH RADIOACTIVE SEED LOCALIZATION Right 08/20/2021   Procedure: RIGHT BREAST LUMPECTOMY WITH RADIOACTIVE SEED LOCALIZATION;  Surgeon: Jovita Kussmaul, MD;  Location: [facility];  Service: General;  Laterality: Right;   CESAREAN SECTION     ECTOPIC PREGNANCY SURGERY  1981   2   EYE SURGERY Right    keratectomy    ROBOTIC ASSISTED LAPAROSCOPIC OVARIAN CYSTECTOMY     TONSILECTOMY/ADENOIDECTOMY WITH MYRINGOTOMY  1960    FAMILY HISTORY: family history includes Diabetes in her brother and brother; Heart disease in her mother; Stroke in her father.  SOCIAL HISTORY:  reports that she has never smoked. She has never used smokeless tobacco. She reports current alcohol use. She reports that she does not use drugs.  ALLERGIES: Patient has no known allergies.  MEDICATIONS:  Current Outpatient Medications  Medication Sig Dispense Refill   Apple Cider Vinegar 300 MG TABS Take by mouth.     calcium carbonate (OS-CAL) 600 MG TABS tablet Take 600 mg by mouth 2 (two) times daily with a meal.     Cholecalciferol (VITAMIN D3) 2000 units TABS Take by mouth.     clobetasol (TEMOVATE) 0.05 % external solution APPLY TO AFFECTED AREA EVERY DAY AT BEDTIME     Coenzyme Q10 (COQ10) 200 MG CAPS Take by mouth.     dutasteride (AVODART) 0.5 MG capsule TAKE ONE PILL DAILY X 1 WEEK, THEN ONE PILL ONCE WEEKLY     famotidine (PEPCID) 20 MG tablet Take 1 tablet (20 mg total) by mouth daily. 90 tablet 3   fluticasone (VERAMYST)  27.5 MCG/SPRAY nasal spray Place 2 sprays into the nose daily.     Lifitegrast 5 % SOLN Apply to eye.     loratadine (CLARITIN) 10 MG tablet Take 10 mg by mouth daily.     losartan (COZAAR) 25 MG tablet TAKE 1 TABLET (25 MG TOTAL) BY MOUTH DAILY. (Patient taking differently: Take 12.5 mg by mouth daily.) 90 tablet 1   MILK THISTLE PO Take by mouth.     oxyCODONE (ROXICODONE) 5 MG immediate release tablet Take 1 tablet (5 mg total) by mouth every 6  (six) hours as needed for severe pain. 10 tablet 0   rosuvastatin (CRESTOR) 5 MG tablet TAKE 1 TABLET BY MOUTH EVERY DAY 90 tablet 3   Vit B12-Methionine-Inos-Chol SOLN Inject into the muscle every 30 (thirty) days.     No current facility-administered medications for this encounter.    ECOG PERFORMANCE STATUS:  0 - Asymptomatic  REVIEW OF SYSTEMS: Patient denies any weight loss, fatigue, weakness, fever, chills or night sweats. Patient denies any loss of vision, blurred vision. Patient denies any ringing  of the ears or hearing loss. No irregular heartbeat. Patient denies heart murmur or history of fainting. Patient denies any chest pain or pain radiating to her upper extremities. Patient denies any shortness of breath, difficulty breathing at night, cough or hemoptysis. Patient denies any swelling in the lower legs. Patient denies any nausea vomiting, vomiting of blood, or coffee ground material in the vomitus. Patient denies any stomach pain. Patient states has had normal bowel movements no significant constipation or diarrhea. Patient denies any dysuria, hematuria or significant nocturia. Patient denies any problems walking, swelling in the joints or loss of balance. Patient denies any skin changes, loss of hair or loss of weight. Patient denies any excessive worrying or anxiety or significant depression. Patient denies any problems with insomnia. Patient denies excessive thirst, polyuria, polydipsia. Patient denies any swollen glands, patient denies easy bruising or easy bleeding. Patient denies any recent infections, allergies or URI. Patient "s visual fields have not changed significantly in recent time.   PHYSICAL EXAM: BP (!) 154/79   Pulse 63   Temp (!) 95.7 F (35.4 C)   Ht '5\' 2"'  (1.575 m)   Wt 154 lb 12.8 oz (70.2 kg)   BMI 28.31 kg/m  Right breast has a well-healed incision around the nipple areolar complex.  No dominant masses noted in either breast.  No axillary or supraclavicular  adenopathy is appreciated.  Well-developed well-nourished patient in NAD. HEENT reveals PERLA, EOMI, discs not visualized.  Oral cavity is clear. No oral mucosal lesions are identified. Neck is clear without evidence of cervical or supraclavicular adenopathy. Lungs are clear to A&P. Cardiac examination is essentially unremarkable with regular rate and rhythm without murmur rub or thrill. Abdomen is benign with no organomegaly or masses noted. Motor sensory and DTR levels are equal and symmetric in the upper and lower extremities. Cranial nerves II through XII are grossly intact. Proprioception is intact. No peripheral adenopathy or edema is identified. No motor or sensory levels are noted. Crude visual fields are within normal range.  LABORATORY DATA: Pathology reports reviewed    RADIOLOGY RESULTS: Mammogram and ultrasound reviewed compatible with above-stated findings   IMPRESSION: At least stage Ia invasive mammary carcinoma the right breast status post wide local excision with no evaluation of sentinel lymph nodes ER/PR positive HER2 negative in 71 year old female  PLAN: This time patient needs evaluation by medical oncology with possible Oncotype DX evaluation.  The other question is her axillary lymph nodes I have done the [facility] nomogram and sentinel node probability with for her parameters comes out at approximately 13%.  Patient will discuss this with medical oncology as this might affect her need for systemic chemotherapy.  If no further evaluation of axilla is performed would include this in my treatment fields and based on the 13% probability of sentinel node spread.  Risks and benefits of radiation therapy including skin reaction fatigue alteration blood counts possible inclusion of superficial lung all were discussed in detail with the patient.  She will be seeing medical oncology and final recommendation for evaluation of her axilla will be performed as well as probable  Oncotype DX.  We will reevaluate her when all this is been accumulated.  Patient comprehends my recommendations well.  I would like to take this opportunity to thank you for allowing me to participate in the care of your patient.Noreene Filbert, MD

## 2021-09-12 ENCOUNTER — Ambulatory Visit: Payer: Self-pay | Admitting: General Surgery

## 2021-09-12 DIAGNOSIS — Z170 Estrogen receptor positive status [ER+]: Secondary | ICD-10-CM

## 2021-09-15 ENCOUNTER — Ambulatory Visit: Payer: Medicare PPO

## 2021-09-15 ENCOUNTER — Ambulatory Visit: Payer: Medicare PPO | Admitting: Nurse Practitioner

## 2021-09-22 ENCOUNTER — Telehealth: Payer: Self-pay

## 2021-09-22 ENCOUNTER — Encounter: Payer: Self-pay | Admitting: Oncology

## 2021-09-22 ENCOUNTER — Inpatient Hospital Stay: Payer: Medicare PPO

## 2021-09-22 ENCOUNTER — Inpatient Hospital Stay: Payer: Medicare PPO | Attending: Oncology | Admitting: Oncology

## 2021-09-22 VITALS — BP 147/86 | HR 58 | Temp 97.5°F | Resp 18 | Wt 153.7 lb

## 2021-09-22 DIAGNOSIS — Z7189 Other specified counseling: Secondary | ICD-10-CM | POA: Insufficient documentation

## 2021-09-22 DIAGNOSIS — I10 Essential (primary) hypertension: Secondary | ICD-10-CM | POA: Insufficient documentation

## 2021-09-22 DIAGNOSIS — Z8049 Family history of malignant neoplasm of other genital organs: Secondary | ICD-10-CM | POA: Diagnosis not present

## 2021-09-22 DIAGNOSIS — M1990 Unspecified osteoarthritis, unspecified site: Secondary | ICD-10-CM | POA: Insufficient documentation

## 2021-09-22 DIAGNOSIS — Z79899 Other long term (current) drug therapy: Secondary | ICD-10-CM | POA: Insufficient documentation

## 2021-09-22 DIAGNOSIS — Z170 Estrogen receptor positive status [ER+]: Secondary | ICD-10-CM | POA: Insufficient documentation

## 2021-09-22 DIAGNOSIS — Z780 Asymptomatic menopausal state: Secondary | ICD-10-CM | POA: Insufficient documentation

## 2021-09-22 DIAGNOSIS — E785 Hyperlipidemia, unspecified: Secondary | ICD-10-CM | POA: Insufficient documentation

## 2021-09-22 DIAGNOSIS — K219 Gastro-esophageal reflux disease without esophagitis: Secondary | ICD-10-CM | POA: Diagnosis not present

## 2021-09-22 DIAGNOSIS — C50411 Malignant neoplasm of upper-outer quadrant of right female breast: Secondary | ICD-10-CM | POA: Insufficient documentation

## 2021-09-22 NOTE — Progress Notes (Signed)
Hematology/Oncology Consult note [phone] [phone]         Patient Care Team: Jonetta Osgood, NP as PCP - General (Nurse Practitioner)  REFERRING PROVIDER: Jonetta Osgood, NP   CHIEF COMPLAINTS/REASON FOR VISIT:  Evaluation of breast cancer  HISTORY OF PRESENTING ILLNESS:   Kristen Reid is a  71 y.o.  female with PMH listed below was seen in consultation at the request of  Jonetta Osgood, NP  for evaluation of right breast cancer Oncology history listed as below Oncology History  Malignant neoplasm of upper-outer quadrant of right breast in female, estrogen receptor positive ([place])  10/10/2019 Mammogram   Unilateral breast mammogram showed partially obscured mass with associated architectural distortion within the upper slightly inner right breast.  Without definitive sonographic correlates.  Stereotactic biopsy was recommended.   10/03/2020 Imaging   1. Stable appearance of RIGHT breast architectural distortion which demonstrated fibrosis and fibrocystic change. X clip is in appropriate position. Recommend surgical consultation for consideration of excision versus close monitoring. 2. No mammographic evidence of malignancy in the LEFT breast   05/02/2021 Mammogram   Unilateral diagnostic mammogram right 1. Possible slight increase in size of the distorted mass at the site of the X shaped biopsy marking clip in the upper-inner right breast. 2.  No evidence of right axillary lymphadenopathy.   09/12/2021 Initial Diagnosis   Malignant neoplasm of upper-outer quadrant of right breast in female, estrogen receptor positive ([place])  -10/06/2019 right breast superior stereotactic core needle biopsy showed benign mammary parenchyma with stromal fibrosis, fibrocystic changes, and focal usual ductal hyperplasia.  Negative for atypical proliferative breast disease. -08/20/2021, right breast lumpectomy showed invasive moderately differentiated ductal  adenocarcinoma, grade 2, associated with DCIS in situ, intermediate nuclear grade, solid type with focal necrosis.  Margins free (invasive tumor 1 mm from superior margin; DCIS 1 mm from superior margin) pT1c pNx    09/22/2021 Cancer Staging   Staging form: Breast, AJCC 8th Edition - Pathologic: Stage Unknown (pT1c, pNX, cM0, G2, ER+, PR+, HER2-) - Signed by Earlie Server, MD on 09/22/2021 Histologic grading system: 3 grade system    Menarche 71 years of age Menopause in 20s. Very short period of time of hormone replacement therapy, less than 1 year Previous OCP for couple of years. Denies any family history of breast cancer.  Great aunt was diagnosed with uterine cancer.  No other cancer family history.  Patient was accompanied by her daughter today.  She reports feeling well. She is scheduled for sentinel needle biopsy by Dr. Marcello Moores.   Review of Systems  Constitutional:  Negative for appetite change, chills, fatigue and fever.  HENT:   Negative for hearing loss and voice change.   Eyes:  Negative for eye problems.  Respiratory:  Negative for chest tightness and cough.   Cardiovascular:  Negative for chest pain.  Gastrointestinal:  Negative for abdominal distention, abdominal pain and blood in stool.  Endocrine: Negative for hot flashes.  Genitourinary:  Negative for difficulty urinating and frequency.   Musculoskeletal:  Negative for arthralgias.  Skin:  Negative for itching and rash.  Neurological:  Negative for extremity weakness.  Hematological:  Negative for adenopathy.  Psychiatric/Behavioral:  Negative for confusion.     MEDICAL HISTORY:  Past Medical History:  Diagnosis Date   Arthritis    back hands   Breast mass, right    Cataract    GERD (gastroesophageal reflux disease)    Hyperlipidemia    Hypertension     SURGICAL  HISTORY: Past Surgical History:  Procedure Laterality Date   BREAST BIOPSY Right 2010 and 04/18/2015   BREAST BIOPSY Right 10/26/2019   x clip  path pending mass w/ distortion   BREAST LUMPECTOMY WITH RADIOACTIVE SEED LOCALIZATION Right 08/20/2021   Procedure: RIGHT BREAST LUMPECTOMY WITH RADIOACTIVE SEED LOCALIZATION;  Surgeon: Jovita Kussmaul, MD;  Location: [facility];  Service: General;  Laterality: Right;   CESAREAN SECTION     ECTOPIC PREGNANCY SURGERY  1981   2   EYE SURGERY Right    keratectomy    ROBOTIC ASSISTED LAPAROSCOPIC OVARIAN CYSTECTOMY     TONSILECTOMY/ADENOIDECTOMY WITH MYRINGOTOMY  1960    SOCIAL HISTORY: Social History   Socioeconomic History   Marital status: Married    Spouse name: Not on file   Number of children: Not on file   Years of education: Not on file   Highest education level: Not on file  Occupational History   Not on file  Tobacco Use   Smoking status: Never   Smokeless tobacco: Never  Vaping Use   Vaping Use: Never used  Substance and Sexual Activity   Alcohol use: Yes    Comment: occasionally / socially   Drug use: Never   Sexual activity: Yes    Birth control/protection: Post-menopausal  Other Topics Concern   Not on file  Social History Narrative   Not on file   Social Determinants of Health   Financial Resource Strain: Not on file  Food Insecurity: Not on file  Transportation Needs: Not on file  Physical Activity: Not on file  Stress: Not on file  Social Connections: Not on file  Intimate Partner Violence: Not on file    FAMILY HISTORY: Family History  Problem Relation Age of Onset   Heart disease Mother    Stroke Father    Skin cancer Father    Diabetes Brother    Diabetes Brother    Cervical cancer Paternal Aunt    Breast cancer Neg Hx     ALLERGIES:  has No Known Allergies.  MEDICATIONS:  Current Outpatient Medications  Medication Sig Dispense Refill   Apple Cider Vinegar 300 MG TABS Take by mouth.     calcium carbonate (OS-CAL) 600 MG TABS tablet Take 600 mg by mouth 2 (two) times daily with a meal.     Cholecalciferol (VITAMIN D3)  2000 units TABS Take by mouth.     clobetasol (TEMOVATE) 0.05 % external solution APPLY TO AFFECTED AREA EVERY DAY AT BEDTIME     Coenzyme Q10 (COQ10) 200 MG CAPS Take by mouth.     dutasteride (AVODART) 0.5 MG capsule TAKE ONE PILL DAILY X 1 WEEK, THEN ONE PILL ONCE WEEKLY     famotidine (PEPCID) 20 MG tablet Take 1 tablet (20 mg total) by mouth daily. 90 tablet 3   fluticasone (VERAMYST) 27.5 MCG/SPRAY nasal spray Place 2 sprays into the nose daily.     Lifitegrast 5 % SOLN Apply to eye.     loratadine (CLARITIN) 10 MG tablet Take 10 mg by mouth daily.     losartan (COZAAR) 25 MG tablet TAKE 1 TABLET (25 MG TOTAL) BY MOUTH DAILY. (Patient taking differently: Take 12.5 mg by mouth daily.) 90 tablet 1   MILK THISTLE PO Take by mouth.     rosuvastatin (CRESTOR) 5 MG tablet TAKE 1 TABLET BY MOUTH EVERY DAY 90 tablet 3   Vit B12-Methionine-Inos-Chol SOLN Inject into the muscle every 30 (thirty) days.  oxyCODONE (ROXICODONE) 5 MG immediate release tablet Take 1 tablet (5 mg total) by mouth every 6 (six) hours as needed for severe pain. (Patient not taking: Reported on 09/22/2021) 10 tablet 0   No current facility-administered medications for this visit.     PHYSICAL EXAMINATION: ECOG PERFORMANCE STATUS: 1 - Symptomatic but completely ambulatory Vitals:   09/22/21 1118  BP: (!) 147/86  Pulse: (!) 58  Resp: 18  Temp: (!) 97.5 F (36.4 C)   Filed Weights   09/22/21 1118  Weight: 153 lb 11.2 oz (69.7 kg)    Physical Exam Constitutional:      General: She is not in acute distress. HENT:     Head: Normocephalic and atraumatic.  Eyes:     General: No scleral icterus. Cardiovascular:     Rate and Rhythm: Normal rate and regular rhythm.     Heart sounds: Normal heart sounds.  Pulmonary:     Effort: Pulmonary effort is normal. No respiratory distress.     Breath sounds: No wheezing.  Abdominal:     General: Bowel sounds are normal. There is no distension.     Palpations: Abdomen  is soft.  Musculoskeletal:        General: No deformity. Normal range of motion.     Cervical back: Normal range of motion and neck supple.  Skin:    General: Skin is warm and dry.     Findings: No erythema or rash.  Neurological:     Mental Status: She is alert and oriented to person, place, and time. Mental status is at baseline.     Cranial Nerves: No cranial nerve deficit.     Coordination: Coordination normal.  Psychiatric:        Mood and Affect: Mood normal.   Breast exam was performed in seated and lying down position. Patient is status post right lumpectomy with a well-healed surgical scar.  No palpable breast mass bilaterally.  No palpable axillary lymphadenopathy bilaterally  LABORATORY DATA:  I have reviewed the data as listed    Latest Ref Rng & Units 09/09/2020    1:01 PM 09/07/2019    8:54 AM 08/23/2018    1:11 PM  CBC  WBC 3.4 - 10.8 x10E3/uL 5.1  3.7  5.0   Hemoglobin 11.1 - 15.9 g/dL 13.4  13.5  13.9   Hematocrit 34.0 - 46.6 % 40.2  40.2  40.2   Platelets 150 - 450 x10E3/uL 207  199  200       Latest Ref Rng & Units 09/09/2020    1:01 PM 09/07/2019    8:54 AM 08/23/2018    1:11 PM  CMP  Glucose 65 - 99 mg/dL 99  87  93   BUN 8 - 27 mg/dL _0 Creatinine 0.57 - 1.00 mg/dL 0.63  0.62  0.67   Sodium 134 - 144 mmol/L 140  143  141   Potassium 3.5 - 5.2 mmol/L 4.0  4.1  3.8   Chloride 96 - 106 mmol/L 103  103  102   CO2 20 - 29 mmol/L _1 Calcium 8.7 - 10.3 mg/dL 10.0  9.3  9.6   Total Protein 6.0 - 8.5 g/dL 6.7  6.6  6.8   Total Bilirubin 0.0 - 1.2 mg/dL 0.9  0.6  1.3   Alkaline Phos 44 - 121 IU/L 57  52  55   AST 0 - 40 IU/L 17  18  17   ALT 0 - 32 IU/L _0 RADIOGRAPHIC STUDIES: I have personally reviewed the radiological images as listed and agreed with the findings in the report. MM Breast Surgical Specimen  Result Date: 08/20/2021 CLINICAL DATA:  Status post excisional biopsy of the right breast. EXAM: SPECIMEN RADIOGRAPH  OF THE RIGHT BREAST COMPARISON:  With priors. FINDINGS: Status post excision of the right breast. Two specimen radiographs were obtained. The radioactive seed is in the first specimen and the X shaped and coil shaped clip are in the second specimen. IMPRESSION: Specimen radiograph of the right breast. Electronically Signed   By: Lillia Mountain M.D.   On: 08/20/2021 10:01  MM RT RADIOACTIVE SEED LOC MAMMO GUIDE  Result Date: 08/19/2021 CLINICAL DATA:  Patient with a mass/distortion within the upper RIGHT breast previously biopsied (x shaped clip) with benign pathology result that was deemed discordant with surgical excision recommended. Patient is now scheduled for surgical excision requiring preoperative radioactive seed localization. EXAM: MAMMOGRAPHIC GUIDED RADIOACTIVE SEED LOCALIZATION OF THE RIGHT BREAST COMPARISON:  Previous exams. FINDINGS: Patient presents for radioactive seed localization prior to surgical excision. I met with the patient and we discussed the procedure of seed localization including benefits and alternatives. We discussed the high likelihood of a successful procedure. We discussed the risks of the procedure including infection, bleeding, tissue injury and further surgery. We discussed the low dose of radioactivity involved in the procedure. Informed, written consent was given. The usual time-out protocol was performed immediately prior to the procedure. Using mammographic guidance, sterile technique, 1% lidocaine and an I-125 radioactive seed, the X shaped clip within the upper RIGHT breast was localized using a superior approach. The follow-up mammogram images confirm the seed in the expected location and were marked for Dr. Marlou Starks. Follow-up survey of the patient confirms presence of the radioactive seed. Order number of I-125 seed:  [id number]. Total activity:  9.794 millicurie reference Date: 04/02/2021 The patient tolerated the procedure well and was released from the [facility]. She  was given instructions regarding seed removal. IMPRESSION: Radioactive seed localization right breast. No apparent complications. Electronically Signed   By: Franki Cabot M.D.   On: 08/19/2021 14:19      ASSESSMENT & PLAN:   Malignant neoplasm of upper-outer quadrant of right breast in female, estrogen receptor positive ([place]) Right upper breast invasive carcinoma plus DCIS, ER/PR positive, HER2 negative.  Ki-67 20%.  pT1c Clinical node-negative disease. Recommendation of sentinel lymph node biopsy of the right axilla was reviewed and discussed with patient. Older patients who are clinically node negative, surgical evaluation of the axilla can be omitted.  Patient is medically fit and active for her age.  Shared decision was made to proceed with evaluation of biopsy. Obtain Oncotype DX, if no predictive chemotherapy benefit, patient will proceed with adjuvant radiation therapy followed by endocrine therapy with aromatase inhibitor.  .  Goals of care, counseling/discussion Curative intent, discussed.   All questions were answered. The patient knows to call the clinic with any problems, questions or concerns.  Earlie Server, MD, PhD 09/22/2021    cc Jonetta Osgood, NP

## 2021-09-22 NOTE — Progress Notes (Signed)
Patient here to establish care  

## 2021-09-22 NOTE — Assessment & Plan Note (Signed)
Curative intent, discussed.

## 2021-09-22 NOTE — Assessment & Plan Note (Addendum)
Right upper breast invasive carcinoma plus DCIS, ER/PR positive, HER2 negative.  Ki-67 20%.  pT1c Clinical node-negative disease. Recommendation of sentinel lymph node biopsy of the right axilla was reviewed and discussed with patient. Older patients who are clinically node negative, surgical evaluation of the axilla can be omitted.  Patient is medically fit and active for her age.  Shared decision was made to proceed with evaluation of biopsy. Obtain Oncotype DX, if no predictive chemotherapy benefit, patient will proceed with adjuvant radiation therapy followed by endocrine therapy with aromatase inhibitor.  Marland Kitchen

## 2021-09-22 NOTE — Telephone Encounter (Signed)
Oncotype order submitted online (order #[id number])

## 2021-09-23 NOTE — Telephone Encounter (Signed)
[employer] breast PA form completed and submitted online.

## 2021-09-24 ENCOUNTER — Encounter: Payer: Self-pay | Admitting: Nurse Practitioner

## 2021-09-24 ENCOUNTER — Ambulatory Visit (INDEPENDENT_AMBULATORY_CARE_PROVIDER_SITE_OTHER): Payer: Medicare PPO | Admitting: Nurse Practitioner

## 2021-09-24 VITALS — BP 136/80 | HR 67 | Temp 98.7°F | Resp 16 | Ht 62.25 in | Wt 154.4 lb

## 2021-09-24 DIAGNOSIS — Z170 Estrogen receptor positive status [ER+]: Secondary | ICD-10-CM | POA: Diagnosis not present

## 2021-09-24 DIAGNOSIS — K219 Gastro-esophageal reflux disease without esophagitis: Secondary | ICD-10-CM | POA: Diagnosis not present

## 2021-09-24 DIAGNOSIS — I10 Essential (primary) hypertension: Secondary | ICD-10-CM

## 2021-09-24 DIAGNOSIS — Z0001 Encounter for general adult medical examination with abnormal findings: Secondary | ICD-10-CM | POA: Diagnosis not present

## 2021-09-24 DIAGNOSIS — C50411 Malignant neoplasm of upper-outer quadrant of right female breast: Secondary | ICD-10-CM

## 2021-09-24 DIAGNOSIS — R300 Dysuria: Secondary | ICD-10-CM | POA: Diagnosis not present

## 2021-09-24 DIAGNOSIS — E538 Deficiency of other specified B group vitamins: Secondary | ICD-10-CM | POA: Diagnosis not present

## 2021-09-24 MED ORDER — FAMOTIDINE 20 MG PO TABS: 20.0000 mg | ORAL_TABLET | Freq: Every day | ORAL | 3 refills | Status: DC

## 2021-09-24 MED ORDER — CYANOCOBALAMIN 1000 MCG/ML IJ SOLN
1000.0000 ug | Freq: Once | INTRAMUSCULAR | Status: AC
  Administered 2021-09-24: 1000 ug via INTRAMUSCULAR

## 2021-09-24 NOTE — Progress Notes (Signed)
[facility] [place], [place] [address]  Internal MEDICINE  Office Visit Note  Patient Name: Kristen Reid  [id number]  [id number]  Date of Service: 09/24/2021  Chief Complaint  Patient presents with   Medicare Wellness    Discuss brain fog, discuss meds and labs    Gastroesophageal Reflux   Hypertension   Hyperlipidemia   Quality Metric Gaps    shingrix     [name] presents for an annual well visit and physical exam.  --well appearing female with no significant medical problems except for recent diagnosis of right breast cancer and is following up with oncology for treatment.  --she is going to have surgery first then will have at least 4 weeks of radiation treatment.  --has a family history of cervical cancer, is interested in still getting 1 more pap smear.  --wants to stop statin medication for now and talk about restarting after radiation treatment, does experience statin myopathy --has stopped BP medication and her BP is normal and stable. Would like to continue to monitor BP at home and stay off BP meds if possible.  --has joint pains and a family history of psoriatic arthritis, interested in further testing.  Had Cataract removal in past couple months bilateral.   Current Medication: Outpatient Encounter Medications as of 09/24/2021  Medication Sig   Apple Cider Vinegar 300 MG TABS Take by mouth.   calcium carbonate (OS-CAL) 600 MG TABS tablet Take 600 mg by mouth 2 (two) times daily with a meal.   Cholecalciferol (VITAMIN D3) 2000 units TABS Take by mouth.   clobetasol (TEMOVATE) 0.05 % external solution APPLY TO AFFECTED AREA EVERY DAY AT BEDTIME   Coenzyme Q10 (COQ10) 200 MG CAPS Take by mouth.   dutasteride (AVODART) 0.5 MG capsule TAKE ONE PILL DAILY X 1 WEEK, THEN ONE PILL ONCE WEEKLY   fluticasone (VERAMYST) 27.5 MCG/SPRAY nasal spray Place 2 sprays into the nose daily.   Lifitegrast 5 % SOLN Apply to eye.   loratadine  (CLARITIN) 10 MG tablet Take 10 mg by mouth daily.   MILK THISTLE PO Take by mouth.   Vit B12-Methionine-Inos-Chol SOLN Inject into the muscle every 30 (thirty) days.   [DISCONTINUED] famotidine (PEPCID) 20 MG tablet Take 1 tablet (20 mg total) by mouth daily.   [DISCONTINUED] losartan (COZAAR) 25 MG tablet TAKE 1 TABLET (25 MG TOTAL) BY MOUTH DAILY. (Patient taking differently: Take 12.5 mg by mouth daily.)   [DISCONTINUED] rosuvastatin (CRESTOR) 5 MG tablet TAKE 1 TABLET BY MOUTH EVERY DAY   famotidine (PEPCID) 20 MG tablet Take 1 tablet (20 mg total) by mouth daily.   [DISCONTINUED] oxyCODONE (ROXICODONE) 5 MG immediate release tablet Take 1 tablet (5 mg total) by mouth every 6 (six) hours as needed for severe pain. (Patient not taking: Reported on 09/22/2021)   [EXPIRED] cyanocobalamin ((VITAMIN B-12)) injection 1,000 mcg    No facility-administered encounter medications on file as of 09/24/2021.    Surgical History: Past Surgical History:  Procedure Laterality Date   BASAL CELL CARCINOMA EXCISION  02/03/2021   on nose   BREAST BIOPSY Right 2010 and 04/18/2015   BREAST BIOPSY Right 10/26/2019   x clip path pending mass w/ distortion   BREAST LUMPECTOMY WITH RADIOACTIVE SEED LOCALIZATION Right 08/20/2021   Procedure: RIGHT BREAST LUMPECTOMY WITH RADIOACTIVE SEED LOCALIZATION;  Surgeon: Jovita Kussmaul, MD;  Location: [facility];  Service: General;  Laterality: Right;   CATARACT EXTRACTION Right 04/23/2021   CATARACT  EXTRACTION Left 05/04/2021   CESAREAN SECTION     corneal saltzman nodules removed Left 03/28/2021   ECTOPIC PREGNANCY SURGERY  1981   2   EYE SURGERY Right    keratectomy    ROBOTIC ASSISTED LAPAROSCOPIC OVARIAN CYSTECTOMY     SENTINEL NODE BIOPSY Right 10/02/2021   Procedure: RIGHT AXILLARY SENTINEL NODE BIOPSY;  Surgeon: Jovita Kussmaul, MD;  Location: [facility];  Service: General;  Laterality: Right;   TONSILECTOMY/ADENOIDECTOMY WITH  MYRINGOTOMY  1960    Medical History: Past Medical History:  Diagnosis Date   Arthritis    back hands   Breast cancer ([place])    Breast mass, right    Cancer ([place])    Cataract    GERD (gastroesophageal reflux disease)    Hyperlipidemia    Hypertension     Family History: Family History  Problem Relation Age of Onset   Heart disease Mother    Stroke Father    Skin cancer Father    Diabetes Brother    Diabetes Brother    Cervical cancer Paternal Aunt    Breast cancer Neg Hx     Social History   Socioeconomic History   Marital status: Married    Spouse name: Not on file   Number of children: Not on file   Years of education: Not on file   Highest education level: Not on file  Occupational History   Not on file  Tobacco Use   Smoking status: Never   Smokeless tobacco: Never  Vaping Use   Vaping Use: Never used  Substance and Sexual Activity   Alcohol use: Yes    Comment: occasionally / socially   Drug use: Never   Sexual activity: Yes    Birth control/protection: Post-menopausal  Other Topics Concern   Not on file  Social History Narrative   Not on file   Social Determinants of Health   Financial Resource Strain: Not on file  Food Insecurity: Not on file  Transportation Needs: Not on file  Physical Activity: Not on file  Stress: Not on file  Social Connections: Not on file  Intimate Partner Violence: Not on file      Review of Systems  Constitutional:  Negative for activity change, appetite change, chills, fatigue, fever and unexpected weight change.  HENT: Negative.  Negative for congestion, ear pain, rhinorrhea, sore throat and trouble swallowing.   Eyes: Negative.   Respiratory: Negative.  Negative for cough, chest tightness, shortness of breath and wheezing.   Cardiovascular: Negative.  Negative for chest pain.  Gastrointestinal: Negative.  Negative for abdominal pain, blood in stool, constipation, diarrhea, nausea and vomiting.  Endocrine:  Negative.   Genitourinary: Negative.  Negative for difficulty urinating, dysuria, frequency, hematuria and urgency.  Musculoskeletal: Negative.  Negative for arthralgias, back pain, joint swelling, myalgias and neck pain.  Skin: Negative.  Negative for rash and wound.  Allergic/Immunologic: Negative.  Negative for immunocompromised state.  Neurological: Negative.  Negative for dizziness, seizures, numbness and headaches.  Hematological: Negative.   Psychiatric/Behavioral: Negative.  Negative for behavioral problems, self-injury and suicidal ideas. The patient is not nervous/anxious.     Vital Signs: BP 136/80   Pulse 67   Temp 98.7 F (37.1 C)   Resp 16   Ht 5' 2.25" (1.581 m)   Wt 154 lb 6.4 oz (70 kg)   SpO2 97%   BMI 28.01 kg/m    Physical Exam Vitals reviewed.  Constitutional:  General: She is not in acute distress.    Appearance: Normal appearance. She is well-developed. She is not ill-appearing or diaphoretic.  HENT:     Head: Normocephalic and atraumatic.     Right Ear: Tympanic membrane, ear canal and external ear normal.     Left Ear: Ear canal and external ear normal.     Nose: Nose normal.     Mouth/Throat:     Mouth: Mucous membranes are moist.     Pharynx: Oropharynx is clear. No oropharyngeal exudate or posterior oropharyngeal erythema.  Eyes:     General: No scleral icterus.       Right eye: No discharge.        Left eye: No discharge.     Extraocular Movements: Extraocular movements intact.     Conjunctiva/sclera: Conjunctivae normal.     Pupils: Pupils are equal, round, and reactive to light.  Neck:     Thyroid: No thyromegaly.     Vascular: No JVD.     Trachea: No tracheal deviation.     Comments: Slightly decreased ROM  Cardiovascular:     Rate and Rhythm: Normal rate and regular rhythm.     Pulses: Normal pulses.     Heart sounds: Normal heart sounds. No murmur heard.    No friction rub. No gallop.  Pulmonary:     Effort: Pulmonary effort  is normal. No respiratory distress.     Breath sounds: Normal breath sounds. No stridor. No wheezing or rales.  Chest:     Chest wall: No tenderness.  Breasts:    Right: No mass.     Left: Normal. No mass.  Abdominal:     General: Bowel sounds are normal. There is no distension.     Palpations: Abdomen is soft. There is no mass.     Tenderness: There is no abdominal tenderness. There is no guarding or rebound.  Musculoskeletal:        General: No tenderness or deformity. Normal range of motion.     Cervical back: Normal range of motion and neck supple. No rigidity or tenderness.  Lymphadenopathy:     Cervical: No cervical adenopathy.  Skin:    General: Skin is warm and dry.     Capillary Refill: Capillary refill takes less than 2 seconds.     Coloration: Skin is not pale.     Findings: No erythema or rash.  Neurological:     Mental Status: She is alert and oriented to person, place, and time.     Cranial Nerves: No cranial nerve deficit.     Motor: No abnormal muscle tone.     Coordination: Coordination normal.     Deep Tendon Reflexes: Reflexes are normal and symmetric.  Psychiatric:        Mood and Affect: Mood normal.        Behavior: Behavior normal.        Thought Content: Thought content normal.        Judgment: Judgment normal.        Assessment/Plan: 1. Encounter for general adult medical examination with abnormal findings Age-appropriate preventive screenings and vaccinations discussed, annual physical exam completed. Routine labs for health maintenance not ordered since she has had a lot of labs recently with her oncologist. PHM updated.   2. Benign hypertension Blood pressure is stable off of medication, will continue to hold BP medications and follow up in a few weeks.   3. Malignant neoplasm of upper-outer quadrant of right breast  in female, estrogen receptor positive ([place]) Followed by oncology. Having surgery soon and then will have at least 4 weeks of  radiation treatments.  4. B12 deficiency B12 injection administered in office today. May schedule nurse visit in 1 month for another B12 injection - cyanocobalamin ((VITAMIN B-12)) injection 1,000 mcg  5. Gastroesophageal reflux disease without esophagitis - famotidine (PEPCID) 20 MG tablet; Take 1 tablet (20 mg total) by mouth daily.  Dispense: 90 tablet; Refill: 3  6. Dysuria - UA/M w/rflx Culture, Routine - Microscopic Examination      General Counseling: jericka kadar understanding of the findings of todays visit and agrees with plan of treatment. I have discussed any further diagnostic evaluation that may be needed or ordered today. We also reviewed her medications today. she has been encouraged to call the office with any questions or concerns that should arise related to todays visit.    Orders Placed This Encounter  Procedures   Microscopic Examination   UA/M w/rflx Culture, Routine    Meds ordered this encounter  Medications   cyanocobalamin ((VITAMIN B-12)) injection 1,000 mcg   famotidine (PEPCID) 20 MG tablet    Sig: Take 1 tablet (20 mg total) by mouth daily.    Dispense:  90 tablet    Refill:  3    For future refills    Return in about 1 month (around 10/25/2021) for F/U, BP check, Labs, Ashlee Bewley PCP - off BP meds - eval. .   Total time spent:30 Minutes Time spent includes review of chart, medications, test results, and follow up plan with the patient.   [place] Controlled Substance Database was reviewed by me.  This patient was seen by Jonetta Osgood, FNP-C in collaboration with Dr. Clayborn Bigness as a part of collaborative care agreement.  Mikhayla Phillis R. Valetta Fuller, MSN, FNP-C Internal medicine

## 2021-09-25 ENCOUNTER — Encounter (HOSPITAL_BASED_OUTPATIENT_CLINIC_OR_DEPARTMENT_OTHER): Payer: Self-pay | Admitting: General Surgery

## 2021-09-25 ENCOUNTER — Other Ambulatory Visit: Payer: Self-pay

## 2021-09-25 LAB — UA/M W/RFLX CULTURE, ROUTINE
Bilirubin, UA: NEGATIVE
Glucose, UA: NEGATIVE
Ketones, UA: NEGATIVE
Leukocytes,UA: NEGATIVE
Nitrite, UA: NEGATIVE
Protein,UA: NEGATIVE
RBC, UA: NEGATIVE
Specific Gravity, UA: 1.006 (ref 1.005–1.030)
Urobilinogen, Ur: 0.2 mg/dL (ref 0.2–1.0)
pH, UA: 7 (ref 5.0–7.5)

## 2021-09-25 LAB — MICROSCOPIC EXAMINATION
Bacteria, UA: NONE SEEN
Casts: NONE SEEN /lpf
Epithelial Cells (non renal): NONE SEEN /hpf (ref 0–10)
RBC, Urine: NONE SEEN /hpf (ref 0–2)
WBC, UA: NONE SEEN /hpf (ref 0–5)

## 2021-09-29 NOTE — Progress Notes (Signed)

## 2021-10-01 NOTE — Progress Notes (Signed)
Urinalysis is normal

## 2021-10-02 ENCOUNTER — Ambulatory Visit (HOSPITAL_BASED_OUTPATIENT_CLINIC_OR_DEPARTMENT_OTHER): Payer: Medicare PPO | Admitting: Anesthesiology

## 2021-10-02 ENCOUNTER — Other Ambulatory Visit: Payer: Self-pay

## 2021-10-02 ENCOUNTER — Encounter (HOSPITAL_BASED_OUTPATIENT_CLINIC_OR_DEPARTMENT_OTHER)
Admission: RE | Disposition: A | Discharge status: Home / Self Care | Payer: Self-pay | Source: Home / Self Care | Attending: General Surgery

## 2021-10-02 ENCOUNTER — Encounter (HOSPITAL_BASED_OUTPATIENT_CLINIC_OR_DEPARTMENT_OTHER): Payer: Self-pay | Admitting: General Surgery

## 2021-10-02 ENCOUNTER — Ambulatory Visit (HOSPITAL_BASED_OUTPATIENT_CLINIC_OR_DEPARTMENT_OTHER)
Admission: RE | Admit: 2021-10-02 | Discharge: 2021-10-02 | Disposition: A | Discharge status: Home / Self Care | Payer: Medicare PPO | Attending: General Surgery | Admitting: General Surgery

## 2021-10-02 DIAGNOSIS — Z888 Allergy status to other drugs, medicaments and biological substances status: Secondary | ICD-10-CM | POA: Diagnosis not present

## 2021-10-02 DIAGNOSIS — C50911 Malignant neoplasm of unspecified site of right female breast: Secondary | ICD-10-CM

## 2021-10-02 DIAGNOSIS — I10 Essential (primary) hypertension: Secondary | ICD-10-CM | POA: Insufficient documentation

## 2021-10-02 DIAGNOSIS — K219 Gastro-esophageal reflux disease without esophagitis: Secondary | ICD-10-CM | POA: Insufficient documentation

## 2021-10-02 DIAGNOSIS — Z170 Estrogen receptor positive status [ER+]: Secondary | ICD-10-CM | POA: Insufficient documentation

## 2021-10-02 DIAGNOSIS — Z01818 Encounter for other preprocedural examination: Secondary | ICD-10-CM

## 2021-10-02 DIAGNOSIS — C50411 Malignant neoplasm of upper-outer quadrant of right female breast: Secondary | ICD-10-CM | POA: Diagnosis not present

## 2021-10-02 HISTORY — PX: SENTINEL NODE BIOPSY: SHX6608

## 2021-10-02 SURGERY — BIOPSY, LYMPH NODE, SENTINEL
Anesthesia: General | Site: Axilla | Laterality: Right

## 2021-10-02 MED ORDER — ACETAMINOPHEN 500 MG PO TABS
ORAL_TABLET | ORAL | Status: AC
  Filled 2021-10-02: qty 2

## 2021-10-02 MED ORDER — GLYCOPYRROLATE PF 0.2 MG/ML IJ SOSY
PREFILLED_SYRINGE | INTRAMUSCULAR | Status: AC
  Filled 2021-10-02: qty 2

## 2021-10-02 MED ORDER — ONDANSETRON HCL 4 MG/2ML IJ SOLN
INTRAMUSCULAR | Status: AC
  Filled 2021-10-02: qty 2

## 2021-10-02 MED ORDER — BUPIVACAINE-EPINEPHRINE 0.25% -1:200000 IJ SOLN
INTRAMUSCULAR | Status: DC | PRN
  Administered 2021-10-02: 8 mL

## 2021-10-02 MED ORDER — MAGTRACE LYMPHATIC TRACER
INTRAMUSCULAR | Status: DC | PRN
  Administered 2021-10-02: 2 mL via INTRAMUSCULAR

## 2021-10-02 MED ORDER — DROPERIDOL 2.5 MG/ML IJ SOLN
INTRAMUSCULAR | Status: DC | PRN
  Administered 2021-10-02: .625 mg via INTRAVENOUS

## 2021-10-02 MED ORDER — GLYCOPYRROLATE 0.2 MG/ML IJ SOLN
INTRAMUSCULAR | Status: DC | PRN
  Administered 2021-10-02: .1 mg via INTRAVENOUS

## 2021-10-02 MED ORDER — LIDOCAINE 2% (20 MG/ML) 5 ML SYRINGE
INTRAMUSCULAR | Status: DC | PRN
  Administered 2021-10-02: 100 mg via INTRAVENOUS

## 2021-10-02 MED ORDER — MIDAZOLAM HCL 5 MG/5ML IJ SOLN
INTRAMUSCULAR | Status: DC | PRN
  Administered 2021-10-02: 1 mg via INTRAVENOUS

## 2021-10-02 MED ORDER — PROPOFOL 10 MG/ML IV BOLUS
INTRAVENOUS | Status: DC | PRN
  Administered 2021-10-02: 150 mg via INTRAVENOUS

## 2021-10-02 MED ORDER — CHLORHEXIDINE GLUCONATE CLOTH 2 % EX PADS: 6.0000 | MEDICATED_PAD | Freq: Once | CUTANEOUS | Status: DC

## 2021-10-02 MED ORDER — CELECOXIB 200 MG PO CAPS
ORAL_CAPSULE | ORAL | Status: AC
Start: 2021-10-02 — End: ?
  Filled 2021-10-02: qty 1

## 2021-10-02 MED ORDER — LACTATED RINGERS IV SOLN: INTRAVENOUS | Status: DC

## 2021-10-02 MED ORDER — LIDOCAINE 2% (20 MG/ML) 5 ML SYRINGE
INTRAMUSCULAR | Status: AC
  Filled 2021-10-02: qty 5

## 2021-10-02 MED ORDER — MIDAZOLAM HCL 2 MG/2ML IJ SOLN
INTRAMUSCULAR | Status: AC
  Filled 2021-10-02: qty 2

## 2021-10-02 MED ORDER — FENTANYL CITRATE (PF) 100 MCG/2ML IJ SOLN
25.0000 ug | INTRAMUSCULAR | Status: DC | PRN
  Administered 2021-10-02 (×2): 25 ug via INTRAVENOUS

## 2021-10-02 MED ORDER — ONDANSETRON HCL 4 MG/2ML IJ SOLN
INTRAMUSCULAR | Status: DC | PRN
  Administered 2021-10-02: 4 mg via INTRAVENOUS

## 2021-10-02 MED ORDER — ACETAMINOPHEN 10 MG/ML IV SOLN: 1000.0000 mg | Freq: Once | INTRAVENOUS | Status: DC | PRN

## 2021-10-02 MED ORDER — FENTANYL CITRATE (PF) 100 MCG/2ML IJ SOLN
INTRAMUSCULAR | Status: AC
  Filled 2021-10-02: qty 2

## 2021-10-02 MED ORDER — ONDANSETRON HCL 4 MG/2ML IJ SOLN: 4.0000 mg | Freq: Once | INTRAMUSCULAR | Status: DC | PRN

## 2021-10-02 MED ORDER — PROPOFOL 10 MG/ML IV BOLUS
INTRAVENOUS | Status: AC
Start: 2021-10-02 — End: ?
  Filled 2021-10-02: qty 20

## 2021-10-02 MED ORDER — ACETAMINOPHEN 500 MG PO TABS
1000.0000 mg | ORAL_TABLET | ORAL | Status: AC
  Administered 2021-10-02: 1000 mg via ORAL

## 2021-10-02 MED ORDER — OXYCODONE HCL 5 MG PO TABS: 5.0000 mg | ORAL_TABLET | Freq: Four times a day (QID) | ORAL | 0 refills | Status: DC | PRN

## 2021-10-02 MED ORDER — GABAPENTIN 300 MG PO CAPS
ORAL_CAPSULE | ORAL | Status: AC
Start: 2021-10-02 — End: ?
  Filled 2021-10-02: qty 1

## 2021-10-02 MED ORDER — FENTANYL CITRATE (PF) 100 MCG/2ML IJ SOLN
INTRAMUSCULAR | Status: DC | PRN
  Administered 2021-10-02: 25 ug via INTRAVENOUS
  Administered 2021-10-02: 50 ug via INTRAVENOUS

## 2021-10-02 MED ORDER — CEFAZOLIN SODIUM-DEXTROSE 2-4 GM/100ML-% IV SOLN
2.0000 g | INTRAVENOUS | Status: AC
  Administered 2021-10-02: 2 g via INTRAVENOUS

## 2021-10-02 MED ORDER — CEFAZOLIN SODIUM-DEXTROSE 2-4 GM/100ML-% IV SOLN
INTRAVENOUS | Status: AC
Start: 2021-10-02 — End: ?
  Filled 2021-10-02: qty 100

## 2021-10-02 MED ORDER — GABAPENTIN 300 MG PO CAPS
300.0000 mg | ORAL_CAPSULE | ORAL | Status: AC
  Administered 2021-10-02: 300 mg via ORAL

## 2021-10-02 MED ORDER — CELECOXIB 200 MG PO CAPS
200.0000 mg | ORAL_CAPSULE | ORAL | Status: AC
  Administered 2021-10-02: 200 mg via ORAL

## 2021-10-02 SURGICAL SUPPLY — 48 items
ADH SKN CLS APL DERMABOND .7 (GAUZE/BANDAGES/DRESSINGS) ×1
APL PRP STRL LF DISP 70% ISPRP (MISCELLANEOUS)
APPLIER CLIP 11 MED OPEN (CLIP)
APPLIER CLIP 9.375 MED OPEN (MISCELLANEOUS) ×6
APR CLP MED 11 20 MLT OPN (CLIP)
APR CLP MED 9.3 20 MLT OPN (MISCELLANEOUS) ×3
BLADE SURG 15 STRL LF DISP TIS (BLADE) ×2 IMPLANT
BLADE SURG 15 STRL SS (BLADE) ×2
CANISTER SUCT 1200ML W/VALVE (MISCELLANEOUS) ×2 IMPLANT
CHLORAPREP W/TINT 26 (MISCELLANEOUS) ×2 IMPLANT
CLIP APPLIE 11 MED OPEN (CLIP) ×2 IMPLANT
CLIP APPLIE 9.375 MED OPEN (MISCELLANEOUS) IMPLANT
COVER BACK TABLE 60X90IN (DRAPES) ×3 IMPLANT
COVER MAYO STAND STRL (DRAPES) ×3 IMPLANT
COVER PROBE W GEL 5X96 (DRAPES) ×3 IMPLANT
DERMABOND ADVANCED (GAUZE/BANDAGES/DRESSINGS) ×1
DERMABOND ADVANCED .7 DNX12 (GAUZE/BANDAGES/DRESSINGS) ×2 IMPLANT
DRAPE LAPAROSCOPIC ABDOMINAL (DRAPES) ×3 IMPLANT
DRAPE UTILITY XL STRL (DRAPES) ×3 IMPLANT
ELECT COATED BLADE 2.86 ST (ELECTRODE) ×3 IMPLANT
ELECT REM PT RETURN 9FT ADLT (ELECTROSURGICAL) ×2
ELECTRODE REM PT RTRN 9FT ADLT (ELECTROSURGICAL) ×2 IMPLANT
GLOVE BIO SURGEON STRL SZ7.5 (GLOVE) ×3 IMPLANT
GOWN STRL REUS W/ TWL LRG LVL3 (GOWN DISPOSABLE) ×4 IMPLANT
GOWN STRL REUS W/TWL LRG LVL3 (GOWN DISPOSABLE) ×4
ILLUMINATOR WAVEGUIDE N/F (MISCELLANEOUS) IMPLANT
KIT MARKER MARGIN INK (KITS) IMPLANT
LIGHT WAVEGUIDE WIDE FLAT (MISCELLANEOUS) IMPLANT
NDL HYPO 25X1 1.5 SAFETY (NEEDLE) ×2 IMPLANT
NDL SAFETY ECLIPSE 18X1.5 (NEEDLE) IMPLANT
NEEDLE HYPO 18GX1.5 SHARP (NEEDLE)
NEEDLE HYPO 25X1 1.5 SAFETY (NEEDLE) ×2 IMPLANT
NS IRRIG 1000ML POUR BTL (IV SOLUTION) ×3 IMPLANT
PACK BASIN DAY SURGERY FS (CUSTOM PROCEDURE TRAY) ×3 IMPLANT
PENCIL SMOKE EVACUATOR (MISCELLANEOUS) ×3 IMPLANT
SLEEVE SCD COMPRESS KNEE MED (STOCKING) ×3 IMPLANT
SPIKE FLUID TRANSFER (MISCELLANEOUS) IMPLANT
SPONGE T-LAP 18X18 ~~LOC~~+RFID (SPONGE) ×3 IMPLANT
SUT MON AB 4-0 PC3 18 (SUTURE) ×3 IMPLANT
SUT SILK 3 0 PS 1 (SUTURE) IMPLANT
SUT VICRYL 3-0 CR8 SH (SUTURE) ×3 IMPLANT
SYR CONTROL 10ML LL (SYRINGE) ×3 IMPLANT
TOWEL GREEN STERILE FF (TOWEL DISPOSABLE) ×3 IMPLANT
TRACER MAGTRACE VIAL (MISCELLANEOUS) ×1 IMPLANT
TRAY DSU PREP LF (CUSTOM PROCEDURE TRAY) ×1 IMPLANT
TRAY FAXITRON CT DISP (TRAY / TRAY PROCEDURE) IMPLANT
TUBE CONNECTING 20X1/4 (TUBING) ×2 IMPLANT
YANKAUER SUCT BULB TIP NO VENT (SUCTIONS) ×2 IMPLANT

## 2021-10-02 NOTE — Anesthesia Preprocedure Evaluation (Signed)
Anesthesia Evaluation  Patient identified by MRN, date of birth, ID band Patient awake    Reviewed: Allergy & Precautions, NPO status , Patient's Chart, lab work & pertinent test results  Airway Mallampati: II  TM Distance: >3 FB Neck ROM: Full    Dental no notable dental hx.    Pulmonary neg pulmonary ROS,    Pulmonary exam normal breath sounds clear to auscultation       Cardiovascular hypertension, Normal cardiovascular exam Rhythm:Regular Rate:Normal     Neuro/Psych negative neurological ROS  negative psych ROS   GI/Hepatic Neg liver ROS, GERD  Medicated,  Endo/Other  negative endocrine ROS  Renal/GU negative Renal ROS  negative genitourinary   Musculoskeletal negative musculoskeletal ROS (+)   Abdominal   Peds negative pediatric ROS (+)  Hematology negative hematology ROS (+)   Anesthesia Other Findings   Reproductive/Obstetrics negative OB ROS                             Anesthesia Physical Anesthesia Plan  ASA: 2  Anesthesia Plan: General   Post-op Pain Management: Minimal or no pain anticipated   Induction: Intravenous  PONV Risk Score and Plan: 3 and Ondansetron, Dexamethasone, Droperidol and Treatment may vary due to age or medical condition  Airway Management Planned: LMA  Additional Equipment:   Intra-op Plan:   Post-operative Plan: Extubation in OR  Informed Consent: I have reviewed the patients History and Physical, chart, labs and discussed the procedure including the risks, benefits and alternatives for the proposed anesthesia with the patient or authorized representative who has indicated his/her understanding and acceptance.     Dental advisory given  Plan Discussed with: CRNA and Surgeon  Anesthesia Plan Comments:         Anesthesia Quick Evaluation

## 2021-10-02 NOTE — Transfer of Care (Signed)
Immediate Anesthesia Transfer of Care Note  Patient: Kristen Reid  Procedure(s) Performed: RIGHT AXILLARY SENTINEL NODE BIOPSY (Right: Axilla)  Patient Location: PACU  Anesthesia Type:General  Level of Consciousness: awake, alert  and oriented  Airway & Oxygen Therapy: Patient Spontanous Breathing  Post-op Assessment: Report given to RN and Post -op Vital signs reviewed and stable  Post vital signs: Reviewed and stable  Last Vitals:  Vitals Value Taken Time  BP 140/67 10/02/21 1353  Temp    Pulse 77 10/02/21 1354  Resp 18 10/02/21 1354  SpO2 97 % 10/02/21 1354  Vitals shown include unvalidated device data.  Last Pain:  Vitals:   10/02/21 1018  TempSrc: Oral  PainSc: 0-No pain         Complications: No notable events documented.

## 2021-10-02 NOTE — Op Note (Signed)
10/02/2021  1:44 PM  PATIENT:  Kristen Reid  71 y.o. female  PRE-OPERATIVE DIAGNOSIS:  RIGHT BREAST CANCER  POST-OPERATIVE DIAGNOSIS:  RIGHT BREAST CANCER  PROCEDURE:  Procedure(s): RIGHT BREAST SENTINEL NODE BIOPSY (Right)  SURGEON:  Surgeon(s) and Role:    * Jovita Kussmaul, MD - Primary  PHYSICIAN ASSISTANT:   ASSISTANTS: none   ANESTHESIA:   local and general  EBL:  10 mL   BLOOD ADMINISTERED:none  DRAINS: none   LOCAL MEDICATIONS USED:  MARCAINE     SPECIMEN:  Source of Specimen:  right axillary sentinel node  DISPOSITION OF SPECIMEN:  PATHOLOGY  COUNTS:  YES  TOURNIQUET:  * No tourniquets in log *  DICTATION: .Dragon Dictation  After informed consent was obtained the patient was brought to the operating room and placed in the supine position on the operating table.  After adequate induction of general anesthesia the patient's right chest, breast, and axillary area were prepped with Betadine and draped in the usual sterile manner.  At this point 2 cc of iron oxide were injected into the subareolar plexus of the right breast and the breast was massaged for 5 minutes.  The mag trace was then used to identify a signal in the right axilla.  The area overlying this was infiltrated with quarter percent Marcaine.  A small transversely oriented incision was made with a 15 blade knife.  The incision was then carried through the skin and subcutaneous tissue sharply with the electrocautery until the deep right axillary space was entered.  Blunt dissection was carried out under the direction of the mag trace.  I was able to identify a lymph node with signal.  This lymph node was excised sharply with the electrocautery and the surrounding small vessels and lymphatics were controlled with clips.  There were likely more than 1 lymph node with the excised tissue.  No other hot or palpable nodes were identified in the right axilla.  Hemostasis was achieved using the Bovie  electrocautery.  The deep layer of the axilla was then closed with interrupted 3-0 Vicryl stitches.  The skin was closed with a running 4-0 Monocryl subcuticular stitch.  Dermabond dressings were applied.  The patient tolerated the procedure well.  At the end of the case all needle sponge and instrument counts were correct.  The patient was then awakened and taken to recovery in stable condition.  PLAN OF CARE: Discharge to home after PACU  PATIENT DISPOSITION:  PACU - hemodynamically stable.   Delay start of Pharmacological VTE agent (>24hrs) due to surgical blood loss or risk of bleeding: not applicable

## 2021-10-02 NOTE — Interval H&P Note (Signed)
History and Physical Interval Note:  10/02/2021 12:29 PM  Kristen Reid  has presented today for surgery, with the diagnosis of RIGHT BREAST CANCER.  The various methods of treatment have been discussed with the patient and family. After consideration of risks, benefits and other options for treatment, the patient has consented to  Procedure(s): RIGHT BREAST SENTINEL NODE BIOPSY (Right) as a surgical intervention.  The patient's history has been reviewed, patient examined, no change in status, stable for surgery.  I have reviewed the patient's chart and labs.  Questions were answered to the patient's satisfaction.     [name]

## 2021-10-02 NOTE — H&P (Signed)
PROVIDER: Landry Corporal, MD  MRN: [id number] DOB: [birth year] Subjective   Chief Complaint: Post Operative Visit   History of Present Illness: Kristen Reid is a 71 y.o. female who is seen today for right breast cancer. The patient is a 71 year old white female who is about 3 weeks status post right breast lumpectomy for what was thought to be a benign mass. The final pathology came back as a invasive ductal type of breast cancer that was ER and PR positive and HER2 negative with a Ki-67 of 30%. The stage was a T1CNX. She tolerated the surgery well.    Review of Systems: A complete review of systems was obtained from the patient. I have reviewed this information and discussed as appropriate with the patient. See HPI as well for other ROS.  ROS   Medical History: Past Medical History:  Diagnosis Date  Arthritis  GERD (gastroesophageal reflux disease)  Hyperlipidemia  Hypertension  S/P breast lumpectomy  Thyroid disease   Patient Active Problem List  Diagnosis  Mass of upper inner quadrant of right breast  Malignant neoplasm of upper-outer quadrant of right breast in female, estrogen receptor positive (CMS-HCC)   Past Surgical History:  Procedure Laterality Date  COLONOSCOPY 12/20/2007  Normal: [facility] 11/2017 Recall ltr mailed 11/09/17 (kj)  CORNEAL EYE SURGERY Right 01/05/2017  [facility], [place], Dr. Lyndel Safe  COLONOSCOPY 02/17/2018  Hyperplastic Polyp: [facility]BF 01/2028  breast lumpectomy  CATARACT EXTRACTION Bilateral  CESAREAN SECTION  TONSILLECTOMY    No Known Allergies  Current Outpatient Medications on File Prior to Visit  Medication Sig Dispense Refill  apple cider vinegar 300 mg Tab Take by mouth  calcium carbonate 600 mg calcium (1,500 mg) Tab tablet Take by mouth  cholecalciferol (VITAMIN D3) 2,000 unit capsule Take 2,000 Units by mouth once daily.  clobetasol (CORMAX) 0.05 % external solution APPLY TO AFFECTED AREA EVERY DAY AT BEDTIME 3  dutasteride (AVODART) 0.5 mg  capsule TAKE ONE PILL DAILY X 1 WEEK, THEN ONE PILL ONCE WEEKLY 6  famotidine (PEPCID) 20 MG tablet Pepcid 20 mg tablet 2 tablets as needed by oral route.  FLAXSEED OIL ORAL Take by mouth.  flaxseed-omega3,6,9-fatty acid 1,300-670-155 mg Cap Take by mouth  Lactobac 40-Bifido 3-S.thermop 100 billion cell Cap Take by mouth.  loratadine (CLARITIN) 10 mg tablet Take 10 mg by mouth once daily.  losartan (COZAAR) 25 MG tablet Take 25 mg by mouth once daily  MILK THISTLE ORAL Take by mouth  omeprazole (PRILOSEC) 20 MG DR capsule Take by mouth  rosuvastatin (CRESTOR) 5 MG tablet Take 5 mg by mouth once daily  ubidecarenone/vitamin E mixed (COQ10 SG 100 ORAL) Take by mouth.  XIIDRA 5 % ophthalmic solution INSTILL 1 DROP INTO BOTH EYES TWICE A DAY 60 mL 11   No current facility-administered medications on file prior to visit.   Family History  Problem Relation Age of Onset  Obesity Sister  High blood pressure (Hypertension) Sister  Hyperlipidemia (Elevated cholesterol) Sister  Skin cancer Brother  Obesity Brother  High blood pressure (Hypertension) Brother  Hyperlipidemia (Elevated cholesterol) Brother  Diabetes Brother  Glaucoma Paternal Aunt  Cataracts Maternal Grandmother  Macular degeneration Neg Hx    Social History   Tobacco Use  Smoking Status Never  Smokeless Tobacco Never    Social History   Socioeconomic History  Marital status: Married  Tobacco Use  Smoking status: Never  Smokeless tobacco: Never  Vaping Use  Vaping Use: Never used  Substance and Sexual Activity  Alcohol use: Yes  Drug use: Never   Objective:   There were no vitals filed for this visit.  There is no height or weight on file to calculate BMI.  Physical Exam Vitals reviewed.  Constitutional:  General: She is not in acute distress. Appearance: Normal appearance.  HENT:  Head: Normocephalic and atraumatic.  Right Ear: External ear normal.  Left Ear: External ear normal.  Nose: Nose  normal.  Mouth/Throat:  Mouth: Mucous membranes are moist.  Pharynx: Oropharynx is clear.  Eyes:  General: No scleral icterus. Extraocular Movements: Extraocular movements intact.  Conjunctiva/sclera: Conjunctivae normal.  Pupils: Pupils are equal, round, and reactive to light.  Cardiovascular:  Rate and Rhythm: Normal rate and regular rhythm.  Pulses: Normal pulses.  Heart sounds: Normal heart sounds.  Pulmonary:  Effort: Pulmonary effort is normal. No respiratory distress.  Breath sounds: Normal breath sounds.  Abdominal:  General: Bowel sounds are normal.  Palpations: Abdomen is soft.  Tenderness: There is no abdominal tenderness.  Musculoskeletal:  General: No swelling, tenderness or deformity. Normal range of motion.  Cervical back: Normal range of motion and neck supple.  Skin: General: Skin is warm and dry.  Coloration: Skin is not jaundiced.  Neurological:  General: No focal deficit present.  Mental Status: She is alert and oriented to person, place, and time.  Psychiatric:  Mood and Affect: Mood normal.  Behavior: Behavior normal.    Breast: The right breast periareolar incision is healing nicely with no sign of infection or seroma.  Labs, Imaging and Diagnostic Testing:  Assessment and Plan:   Diagnoses and all orders for this visit:  Malignant neoplasm of upper-outer quadrant of right breast in female, estrogen receptor positive (CMS-HCC) - [facility] Case Posting Request; Future    The patient is about 3 weeks status post right breast lumpectomy for what turned out to be a breast cancer. To properly stage the cancer we will need to go back and do a sentinel node biopsy. I have discussed with her in detail the risks and benefits of this operation as well as some of the technical aspects and she understands and wishes to proceed. She did develop a rash to the skin prep so we will try to prep her with Betadine for the next operation. She will also meet with medical  and radiation oncology to discuss adjuvant therapy.

## 2021-10-02 NOTE — Discharge Instructions (Signed)
  Post Anesthesia Home Care Instructions  Activity: Get plenty of rest for the remainder of the day. A responsible individual must stay with you for 24 hours following the procedure.  For the next 24 hours, DO NOT: -Drive a car -Paediatric nurse -Drink alcoholic beverages -Take any medication unless instructed by your physician -Make any legal decisions or sign important papers.  Meals: Start with liquid foods such as gelatin or soup. Progress to regular foods as tolerated. Avoid greasy, spicy, heavy foods. If nausea and/or vomiting occur, drink only clear liquids until the nausea and/or vomiting subsides. Call your physician if vomiting continues.  Special Instructions/Symptoms: Your throat may feel dry or sore from the anesthesia or the breathing tube placed in your throat during surgery. If this causes discomfort, gargle with warm salt water. The discomfort should disappear within 24 hours.  Next dose of Tylenol can be taken at 430pom today. Next dose of Ibuprofen can be taken at 630pm today.

## 2021-10-02 NOTE — Anesthesia Postprocedure Evaluation (Signed)
Anesthesia Post Note  Patient: Kristen Reid  Procedure(s) Performed: RIGHT AXILLARY SENTINEL NODE BIOPSY (Right: Axilla)     Patient location during evaluation: PACU Anesthesia Type: General Level of consciousness: awake and alert Pain management: pain level controlled Vital Signs Assessment: post-procedure vital signs reviewed and stable Respiratory status: spontaneous breathing, nonlabored ventilation, respiratory function stable and patient connected to nasal cannula oxygen Cardiovascular status: blood pressure returned to baseline and stable Postop Assessment: no apparent nausea or vomiting Anesthetic complications: no   No notable events documented.  Last Vitals:  Vitals:   10/02/21 1430 10/02/21 1445  BP: (!) 141/69 (!) 152/72  Pulse: (!) 51 (!) 55  Resp: 11 12  Temp:    SpO2: 100% 97%    Last Pain:  Vitals:   10/02/21 1430  TempSrc:   PainSc: 1                  Barnet Glasgow

## 2021-10-02 NOTE — Anesthesia Procedure Notes (Signed)
Procedure Name: LMA Insertion Date/Time: 10/02/2021 1:00 PM  Performed by: Gwyndolyn Saxon, CRNAPre-anesthesia Checklist: Patient identified, Emergency Drugs available, Suction available and Patient being monitored Patient Re-evaluated:Patient Re-evaluated prior to induction Oxygen Delivery Method: Circle system utilized Preoxygenation: Pre-oxygenation with 100% oxygen Induction Type: IV induction Ventilation: Mask ventilation without difficulty LMA: LMA inserted LMA Size: 4.0 Number of attempts: 1 Placement Confirmation: positive ETCO2 and breath sounds checked- equal and bilateral Tube secured with: Tape Dental Injury: Teeth and Oropharynx as per pre-operative assessment

## 2021-10-03 ENCOUNTER — Encounter (HOSPITAL_BASED_OUTPATIENT_CLINIC_OR_DEPARTMENT_OTHER): Payer: Self-pay | Admitting: General Surgery

## 2021-10-06 ENCOUNTER — Encounter: Payer: Self-pay | Admitting: Oncology

## 2021-10-06 DIAGNOSIS — C50411 Malignant neoplasm of upper-outer quadrant of right female breast: Secondary | ICD-10-CM | POA: Diagnosis not present

## 2021-10-06 DIAGNOSIS — Z170 Estrogen receptor positive status [ER+]: Secondary | ICD-10-CM | POA: Diagnosis not present

## 2021-10-07 NOTE — Telephone Encounter (Signed)
Oncotype Dx results available in Media.

## 2021-10-08 LAB — SURGICAL PATHOLOGY

## 2021-10-09 ENCOUNTER — Other Ambulatory Visit: Payer: Self-pay | Admitting: Nurse Practitioner

## 2021-10-09 ENCOUNTER — Encounter (HOSPITAL_COMMUNITY): Payer: Self-pay

## 2021-10-09 DIAGNOSIS — E782 Mixed hyperlipidemia: Secondary | ICD-10-CM | POA: Diagnosis not present

## 2021-10-09 DIAGNOSIS — E559 Vitamin D deficiency, unspecified: Secondary | ICD-10-CM | POA: Diagnosis not present

## 2021-10-09 DIAGNOSIS — Z0001 Encounter for general adult medical examination with abnormal findings: Secondary | ICD-10-CM | POA: Diagnosis not present

## 2021-10-09 DIAGNOSIS — E538 Deficiency of other specified B group vitamins: Secondary | ICD-10-CM | POA: Diagnosis not present

## 2021-10-09 DIAGNOSIS — I10 Essential (primary) hypertension: Secondary | ICD-10-CM | POA: Diagnosis not present

## 2021-10-11 LAB — LIPID PANEL
Chol/HDL Ratio: 3.7 ratio (ref 0.0–4.4)
Cholesterol, Total: 303 mg/dL — ABNORMAL HIGH (ref 100–199)
HDL: 83 mg/dL (ref >39)
LDL Chol Calc (NIH): 204 mg/dL — ABNORMAL HIGH (ref 0–99)
Triglycerides: 96 mg/dL (ref 0–149)
VLDL Cholesterol Cal: 16 mg/dL (ref 5–40)

## 2021-10-11 LAB — B12 AND FOLATE PANEL
Folate: 6.2 ng/mL (ref >3.0)
Vitamin B-12: 610 pg/mL (ref 232–1245)

## 2021-10-11 LAB — COMPREHENSIVE METABOLIC PANEL
ALT: 16 IU/L (ref 0–32)
AST: 23 IU/L (ref 0–40)
Albumin/Globulin Ratio: 2.6 — ABNORMAL HIGH (ref 1.2–2.2)
Albumin: 4.6 g/dL (ref 3.9–4.9)
Alkaline Phosphatase: 68 IU/L (ref 44–121)
BUN/Creatinine Ratio: 17 (ref 12–28)
BUN: 11 mg/dL (ref 8–27)
Bilirubin Total: 1.3 mg/dL — ABNORMAL HIGH (ref 0.0–1.2)
CO2: 26 mmol/L (ref 20–29)
Calcium: 9.8 mg/dL (ref 8.7–10.3)
Chloride: 103 mmol/L (ref 96–106)
Creatinine, Ser: 0.65 mg/dL (ref 0.57–1.00)
Globulin, Total: 1.8 g/dL (ref 1.5–4.5)
Glucose: 102 mg/dL — ABNORMAL HIGH (ref 70–99)
Potassium: 4.3 mmol/L (ref 3.5–5.2)
Sodium: 142 mmol/L (ref 134–144)
Total Protein: 6.4 g/dL (ref 6.0–8.5)
eGFR: 95 mL/min/{1.73_m2} (ref >59)

## 2021-10-11 LAB — ANA: Anti Nuclear Antibody (ANA): NEGATIVE

## 2021-10-11 LAB — CBC WITH DIFFERENTIAL/PLATELET
Basophils Absolute: 0 10*3/uL (ref 0.0–0.2)
Basos: 1 %
EOS (ABSOLUTE): 0.1 10*3/uL (ref 0.0–0.4)
Eos: 3 %
Hematocrit: 36.5 % (ref 34.0–46.6)
Hemoglobin: 12.3 g/dL (ref 11.1–15.9)
Immature Grans (Abs): 0 10*3/uL (ref 0.0–0.1)
Immature Granulocytes: 0 %
Lymphocytes Absolute: 1.9 10*3/uL (ref 0.7–3.1)
Lymphs: 48 %
MCH: 31.9 pg (ref 26.6–33.0)
MCHC: 33.7 g/dL (ref 31.5–35.7)
MCV: 95 fL (ref 79–97)
Monocytes Absolute: 0.3 10*3/uL (ref 0.1–0.9)
Monocytes: 8 %
Neutrophils Absolute: 1.6 10*3/uL (ref 1.4–7.0)
Neutrophils: 40 %
Platelets: 236 10*3/uL (ref 150–450)
RBC: 3.86 x10E6/uL (ref 3.77–5.28)
RDW: 12.9 % (ref 11.7–15.4)
WBC: 4 10*3/uL (ref 3.4–10.8)

## 2021-10-11 LAB — TSH: TSH: 3.46 u[IU]/mL (ref 0.450–4.500)

## 2021-10-11 LAB — VITAMIN D 25 HYDROXY (VIT D DEFICIENCY, FRACTURES): Vit D, 25-Hydroxy: 54.1 ng/mL (ref 30.0–100.0)

## 2021-10-11 LAB — T4, FREE: Free T4: 1.18 ng/dL (ref 0.82–1.77)

## 2021-10-11 LAB — RHEUMATOID FACTOR: Rheumatoid fact SerPl-aCnc: 11 IU/mL (ref <14.0)

## 2021-10-11 LAB — C-REACTIVE PROTEIN: CRP: 2 mg/L (ref 0–10)

## 2021-10-11 LAB — SEDIMENTATION RATE: Sed Rate: 4 mm/hr (ref 0–40)

## 2021-10-13 ENCOUNTER — Telehealth: Payer: Self-pay

## 2021-10-13 ENCOUNTER — Encounter: Payer: Self-pay | Admitting: *Deleted

## 2021-10-13 ENCOUNTER — Other Ambulatory Visit: Payer: Self-pay

## 2021-10-13 DIAGNOSIS — Z170 Estrogen receptor positive status [ER+]: Secondary | ICD-10-CM

## 2021-10-13 NOTE — Telephone Encounter (Signed)
-----   Message from Daiva Huge, RN sent at 10/13/2021 10:57 AM EDT ----- She will see Dr. Baruch Gouty 8/21, appt. Details left on her voicemail.   Ana  ----- Message ----- From: Earlie Server, MD Sent: 10/10/2021   5:36 PM EDT To: Evelina Dun, RN; Daiva Huge, RN; #  Sentinel lymph nodes: Negative. Oncotype DX indicates no benefit for chemotherapy I recommend patient to establish care with radiation oncology. Please put her on TBD list-plan to see her back 2 weeks after last radiation to discuss adjuvant endocrine therapy.  Thank you very much

## 2021-10-13 NOTE — Progress Notes (Signed)
Dr. Tasia Catchings would like Ms. Lares to follow back up with Dr. Baruch Gouty.   She will see him 10/20/21 at 9:30.   Voicemail left with appt. Details.

## 2021-10-13 NOTE — Telephone Encounter (Signed)
Per Wilhemena Durie, pt has been informed and she is already scheduled for rad onc.   Pt has been added to reminder list to schedule follow up.

## 2021-10-20 ENCOUNTER — Ambulatory Visit
Admission: RE | Admit: 2021-10-20 | Discharge: 2021-10-20 | Disposition: A | Discharge status: Home / Self Care | Payer: Medicare PPO | Source: Ambulatory Visit | Attending: Radiation Oncology | Admitting: Radiation Oncology

## 2021-10-20 ENCOUNTER — Ambulatory Visit: Payer: Medicare PPO

## 2021-10-20 VITALS — BP 160/75 | HR 60 | Temp 97.5°F | Resp 16 | Ht 62.25 in | Wt 155.9 lb

## 2021-10-20 DIAGNOSIS — Z79899 Other long term (current) drug therapy: Secondary | ICD-10-CM | POA: Insufficient documentation

## 2021-10-20 DIAGNOSIS — Z170 Estrogen receptor positive status [ER+]: Secondary | ICD-10-CM | POA: Insufficient documentation

## 2021-10-20 DIAGNOSIS — C50211 Malignant neoplasm of upper-inner quadrant of right female breast: Secondary | ICD-10-CM

## 2021-10-20 DIAGNOSIS — C50911 Malignant neoplasm of unspecified site of right female breast: Secondary | ICD-10-CM | POA: Diagnosis not present

## 2021-10-20 NOTE — Progress Notes (Signed)
Radiation Oncology Follow up Note  Name: Kristen Reid   Date:   10/20/2021 MRN:  [id number] DOB: 14-Aug-1950    This 71 y.o. female presents to the clinic today for stage Ia ER positive invasive mammary carcinoma of the right breast status post wide local excision and sentinel node biopsy.  REFERRING PROVIDER: Jonetta Osgood, NP  HPI: Patient is a 71 year old female originally consulted back in July for a stage T1 a NX M0) invasive mammary carcinoma ER/PR positive HER2 not overexpressed.  She did not have set sentinel node biopsy that was performed since our last meeting.  She had.  4 sentinel lymph nodes removed all negative for metastatic disease.  She also had Oncotype performed showing recurrence score of 18 negating the need for systemic chemotherapy.  She is still somewhat bruised from her sentinel node biopsy with the almost entire breast somewhat swollen and showing ecchymosis.  She otherwise is without complaint.  Her initial breast tumor was a 1.1 cm invasive mammary carcinoma overall grade 2 with margins 1 mm from the superior margin for the invasive component and 1 mm meter from the superior margin for the DCIS component.  COMPLICATIONS OF TREATMENT: none  FOLLOW UP COMPLIANCE: keeps appointments   PHYSICAL EXAM:  BP (!) 160/75   Pulse 60   Temp (!) 97.5 F (36.4 C)   Resp 16   Ht 5' 2.25" (1.581 m)   Wt 155 lb 14.4 oz (70.7 kg)   BMI 28.29 kg/m  Right breast is somewhat swollen with some ecchymosis present consistent with recent axillary dissection.  No other dominant masses noted in either breast.  No axillary or supraclavicular adenopathy is appreciated.  Well-developed well-nourished patient in NAD. HEENT reveals PERLA, EOMI, discs not visualized.  Oral cavity is clear. No oral mucosal lesions are identified. Neck is clear without evidence of cervical or supraclavicular adenopathy. Lungs are clear to A&P. Cardiac examination is essentially unremarkable with regular  rate and rhythm without murmur rub or thrill. Abdomen is benign with no organomegaly or masses noted. Motor sensory and DTR levels are equal and symmetric in the upper and lower extremities. Cranial nerves II through XII are grossly intact. Proprioception is intact. No peripheral adenopathy or edema is identified. No motor or sensory levels are noted. Crude visual fields are within normal range.  RADIOLOGY RESULTS: Mammograms reviewed  PLAN: At this time elect to go with whole breast radiation.  I will plan on a hypofractionated course of treatment over 3 weeks.  Would boost her scar another 1600 centigrade based on the close superior margin of 1 mm for the DCIS component.  Risks and benefits of treatment clinic skin reaction fatigue alteration of blood counts possible inclusion of superficial lung all were reviewed in detail with the patient.  Patient comprehends my treatment plan well.  I would like to take this opportunity to thank you for allowing me to participate in the care of your patient.Noreene Filbert, MD

## 2021-10-22 ENCOUNTER — Ambulatory Visit: Payer: Medicare PPO

## 2021-10-23 DIAGNOSIS — C50911 Malignant neoplasm of unspecified site of right female breast: Secondary | ICD-10-CM | POA: Diagnosis not present

## 2021-10-23 DIAGNOSIS — Z170 Estrogen receptor positive status [ER+]: Secondary | ICD-10-CM | POA: Diagnosis not present

## 2021-10-28 ENCOUNTER — Ambulatory Visit
Admission: RE | Admit: 2021-10-28 | Discharge: 2021-10-28 | Disposition: A | Discharge status: Home / Self Care | Payer: Medicare PPO | Source: Ambulatory Visit | Attending: Radiation Oncology | Admitting: Radiation Oncology

## 2021-10-28 DIAGNOSIS — C50911 Malignant neoplasm of unspecified site of right female breast: Secondary | ICD-10-CM | POA: Diagnosis not present

## 2021-10-28 DIAGNOSIS — C50211 Malignant neoplasm of upper-inner quadrant of right female breast: Secondary | ICD-10-CM | POA: Insufficient documentation

## 2021-10-28 DIAGNOSIS — Z170 Estrogen receptor positive status [ER+]: Secondary | ICD-10-CM | POA: Diagnosis not present

## 2021-10-29 ENCOUNTER — Ambulatory Visit: Payer: Medicare PPO | Admitting: Nurse Practitioner

## 2021-10-29 ENCOUNTER — Encounter: Payer: Self-pay | Admitting: Nurse Practitioner

## 2021-10-29 DIAGNOSIS — I10 Essential (primary) hypertension: Secondary | ICD-10-CM | POA: Diagnosis not present

## 2021-10-29 DIAGNOSIS — E538 Deficiency of other specified B group vitamins: Secondary | ICD-10-CM | POA: Diagnosis not present

## 2021-10-29 DIAGNOSIS — E782 Mixed hyperlipidemia: Secondary | ICD-10-CM

## 2021-10-29 MED ORDER — CYANOCOBALAMIN 1000 MCG/ML IJ SOLN
1000.0000 ug | Freq: Once | INTRAMUSCULAR | Status: AC
  Administered 2021-10-29: 1000 ug via INTRAMUSCULAR

## 2021-10-29 NOTE — Progress Notes (Signed)
[facility] [place], [place] [address]  Internal MEDICINE  Office Visit Note  Patient Name: Kristen Reid  [id number]  [id number]  Date of Service: 10/29/2021  Chief Complaint  Patient presents with   Follow-up   Hypertension    HPI Relda presents for a follow up visit for hypertension and lab results --BP has been stable since her last office visit without any BP medications. Home BP ranges from 110s/60s to 130s/80. BP stable today --Labs are grossly normal, except for elevated bilirubin at 1.3 and significant elevated LDL >200 and total cholesterol >300. Elevated cholesterol runs in the family. Had surgery on 8/3.  --will be starting 6 weeks of radiation treatment soon, will be taking an estrogen inhibitor medication after treatment is complete for 5 years per patient report.      Current Medication: Outpatient Encounter Medications as of 10/29/2021  Medication Sig   Apple Cider Vinegar 300 MG TABS Take by mouth.   calcium carbonate (OS-CAL) 600 MG TABS tablet Take 600 mg by mouth 2 (two) times daily with a meal.   Cholecalciferol (VITAMIN D3) 2000 units TABS Take by mouth.   clobetasol (TEMOVATE) 0.05 % external solution APPLY TO AFFECTED AREA EVERY DAY AT BEDTIME   Coenzyme Q10 (COQ10) 200 MG CAPS Take by mouth.   dutasteride (AVODART) 0.5 MG capsule TAKE ONE PILL DAILY X 1 WEEK, THEN ONE PILL ONCE WEEKLY   famotidine (PEPCID) 20 MG tablet Take 1 tablet (20 mg total) by mouth daily.   fluticasone (VERAMYST) 27.5 MCG/SPRAY nasal spray Place 2 sprays into the nose daily.   Lifitegrast 5 % SOLN Apply to eye.   loratadine (CLARITIN) 10 MG tablet Take 10 mg by mouth daily.   MILK THISTLE PO Take by mouth.   Vit B12-Methionine-Inos-Chol SOLN Inject into the muscle every 30 (thirty) days.   oxyCODONE (ROXICODONE) 5 MG immediate release tablet Take 1 tablet (5 mg total) by mouth every 6 (six) hours as needed for severe pain.   [EXPIRED]  cyanocobalamin (VITAMIN B12) injection 1,000 mcg    No facility-administered encounter medications on file as of 10/29/2021.    Surgical History: Past Surgical History:  Procedure Laterality Date   BASAL CELL CARCINOMA EXCISION  02/03/2021   on nose   BREAST BIOPSY Right 2010 and 04/18/2015   BREAST BIOPSY Right 10/26/2019   x clip path pending mass w/ distortion   BREAST LUMPECTOMY WITH RADIOACTIVE SEED LOCALIZATION Right 08/20/2021   Procedure: RIGHT BREAST LUMPECTOMY WITH RADIOACTIVE SEED LOCALIZATION;  Surgeon: Jovita Kussmaul, MD;  Location: [facility];  Service: General;  Laterality: Right;   CATARACT EXTRACTION Right 04/23/2021   CATARACT EXTRACTION Left 05/04/2021   CESAREAN SECTION     corneal saltzman nodules removed Left 03/28/2021   ECTOPIC PREGNANCY SURGERY  1981   2   EYE SURGERY Right    keratectomy    ROBOTIC ASSISTED LAPAROSCOPIC OVARIAN CYSTECTOMY     SENTINEL NODE BIOPSY Right 10/02/2021   Procedure: RIGHT AXILLARY SENTINEL NODE BIOPSY;  Surgeon: Jovita Kussmaul, MD;  Location: [place];  Service: General;  Laterality: Right;   TONSILECTOMY/ADENOIDECTOMY WITH MYRINGOTOMY  1960    Medical History: Past Medical History:  Diagnosis Date   Arthritis    back hands   Breast cancer ([place])    Breast mass, right    Cancer ([place])    Cataract    GERD (gastroesophageal reflux disease)    Hyperlipidemia  Hypertension     Family History: Family History  Problem Relation Age of Onset   Heart disease Mother    Stroke Father    Skin cancer Father    Diabetes Brother    Diabetes Brother    Cervical cancer Paternal Aunt    Breast cancer Neg Hx     Social History   Socioeconomic History   Marital status: Married    Spouse name: Not on file   Number of children: Not on file   Years of education: Not on file   Highest education level: Not on file  Occupational History   Not on file  Tobacco Use   Smoking status: Never    Smokeless tobacco: Never  Vaping Use   Vaping Use: Never used  Substance and Sexual Activity   Alcohol use: Yes    Comment: occasionally / socially   Drug use: Never   Sexual activity: Yes    Birth control/protection: Post-menopausal  Other Topics Concern   Not on file  Social History Narrative   Not on file   Social Determinants of Health   Financial Resource Strain: Not on file  Food Insecurity: Not on file  Transportation Needs: Not on file  Physical Activity: Not on file  Stress: Not on file  Social Connections: Not on file  Intimate Partner Violence: Not on file      Review of Systems  Constitutional:  Negative for chills, fatigue and unexpected weight change.  HENT:  Negative for congestion, postnasal drip, rhinorrhea, sneezing and sore throat.   Eyes:  Negative for redness.  Respiratory:  Negative for cough, chest tightness and shortness of breath.   Cardiovascular:  Negative for chest pain and palpitations.  Gastrointestinal:  Negative for abdominal pain, constipation, diarrhea, nausea and vomiting.  Genitourinary:  Negative for dysuria and frequency.  Musculoskeletal:  Negative for arthralgias, back pain, joint swelling and neck pain.  Skin:  Negative for rash.  Neurological: Negative.  Negative for tremors and numbness.  Hematological:  Negative for adenopathy. Does not bruise/bleed easily.  Psychiatric/Behavioral:  Negative for behavioral problems (Depression), sleep disturbance and suicidal ideas. The patient is not nervous/anxious.     Vital Signs: BP 130/80   Pulse (!) 58   Temp 97.8 F (36.6 C)   Resp 16   Ht 5' 2.5" (1.588 m)   Wt 154 lb (69.9 kg)   SpO2 99%   BMI 27.72 kg/m    Physical Exam Vitals reviewed.  Constitutional:      General: She is not in acute distress.    Appearance: Normal appearance. She is normal weight. She is not ill-appearing.  HENT:     Head: Normocephalic and atraumatic.  Eyes:     Pupils: Pupils are equal, round,  and reactive to light.  Cardiovascular:     Rate and Rhythm: Normal rate and regular rhythm.  Pulmonary:     Effort: Pulmonary effort is normal. No respiratory distress.  Neurological:     Mental Status: She is alert and oriented to person, place, and time.     Cranial Nerves: No cranial nerve deficit.     Coordination: Coordination normal.     Gait: Gait normal.  Psychiatric:        Mood and Affect: Mood normal.        Behavior: Behavior normal.        Assessment/Plan: 1. Hyperbilirubinemia Repeat hep function panel, elevated bilirubin may be related to recent surgery, repeat lab in late October  to reassess bili level for resolution - Hepatic function panel  2. B12 deficiency B12 inj done in office, may repeat in 1 month via nurse visit - cyanocobalamin (VITAMIN B12) injection 1,000 mcg  3. Benign hypertension BP stable without any medication, may continue to hold BP med. Patient will continue to check BP at home regularly. Will call clinic if any issues  4. Mixed hyperlipidemia Very significantly elevated LDL, also have high HDL. No meds added right now due to cancer treatment. It is possible that the estrogen inhibitor may help lower cholesterol level. This may be a familial issue, significant FH of high cholesterol. Will discuss adding medication at her next office visit, after her radiation treatment is completed.   General Counseling: crystelle ferrufino understanding of the findings of todays visit and agrees with plan of treatment. I have discussed any further diagnostic evaluation that may be needed or ordered today. We also reviewed her medications today. she has been encouraged to call the office with any questions or concerns that should arise related to todays visit.    Orders Placed This Encounter  Procedures   Hepatic function panel    Meds ordered this encounter  Medications   cyanocobalamin (VITAMIN B12) injection 1,000 mcg    Return in about 11 weeks  (around 01/14/2022) for F/U, Labs, Chrystle Murillo PCP.   Total time spent:30 Minutes Time spent includes review of chart, medications, test results, and follow up plan with the patient.   [place] Controlled Substance Database was reviewed by me.  This patient was seen by Jonetta Osgood, FNP-C in collaboration with Dr. Clayborn Bigness as a part of collaborative care agreement.   Latrece Nitta R. Valetta Fuller, MSN, FNP-C Internal medicine

## 2021-10-30 ENCOUNTER — Telehealth: Payer: Self-pay

## 2021-10-30 ENCOUNTER — Encounter: Payer: Self-pay | Admitting: Nurse Practitioner

## 2021-10-30 DIAGNOSIS — Z170 Estrogen receptor positive status [ER+]: Secondary | ICD-10-CM | POA: Diagnosis not present

## 2021-10-30 DIAGNOSIS — C50211 Malignant neoplasm of upper-inner quadrant of right female breast: Secondary | ICD-10-CM | POA: Diagnosis not present

## 2021-10-30 DIAGNOSIS — C50911 Malignant neoplasm of unspecified site of right female breast: Secondary | ICD-10-CM | POA: Diagnosis not present

## 2021-10-30 NOTE — Telephone Encounter (Signed)
Pt scheduled fo last XRT tx on 10/9. Please schedule follow up (MD only) 2 weeks after that date and inform pt of appt.

## 2021-10-31 ENCOUNTER — Other Ambulatory Visit: Payer: Self-pay | Admitting: *Deleted

## 2021-10-31 DIAGNOSIS — C50211 Malignant neoplasm of upper-inner quadrant of right female breast: Secondary | ICD-10-CM

## 2021-11-04 ENCOUNTER — Ambulatory Visit
Admission: RE | Admit: 2021-11-04 | Discharge: 2021-11-04 | Disposition: A | Discharge status: Home / Self Care | Payer: Medicare PPO | Source: Ambulatory Visit | Attending: Radiation Oncology | Admitting: Radiation Oncology

## 2021-11-04 DIAGNOSIS — L905 Scar conditions and fibrosis of skin: Secondary | ICD-10-CM | POA: Insufficient documentation

## 2021-11-04 DIAGNOSIS — C50911 Malignant neoplasm of unspecified site of right female breast: Secondary | ICD-10-CM | POA: Diagnosis not present

## 2021-11-04 DIAGNOSIS — Z510 Encounter for antineoplastic radiation therapy: Secondary | ICD-10-CM | POA: Insufficient documentation

## 2021-11-04 DIAGNOSIS — Z170 Estrogen receptor positive status [ER+]: Secondary | ICD-10-CM | POA: Diagnosis not present

## 2021-11-04 DIAGNOSIS — C50211 Malignant neoplasm of upper-inner quadrant of right female breast: Secondary | ICD-10-CM | POA: Insufficient documentation

## 2021-11-05 ENCOUNTER — Other Ambulatory Visit: Payer: Self-pay

## 2021-11-05 ENCOUNTER — Ambulatory Visit
Admission: RE | Admit: 2021-11-05 | Discharge: 2021-11-05 | Disposition: A | Discharge status: Home / Self Care | Payer: Medicare PPO | Source: Ambulatory Visit | Attending: Radiation Oncology | Admitting: Radiation Oncology

## 2021-11-05 DIAGNOSIS — L905 Scar conditions and fibrosis of skin: Secondary | ICD-10-CM | POA: Diagnosis not present

## 2021-11-05 DIAGNOSIS — Z510 Encounter for antineoplastic radiation therapy: Secondary | ICD-10-CM | POA: Diagnosis not present

## 2021-11-05 DIAGNOSIS — Z170 Estrogen receptor positive status [ER+]: Secondary | ICD-10-CM | POA: Diagnosis not present

## 2021-11-05 DIAGNOSIS — C50911 Malignant neoplasm of unspecified site of right female breast: Secondary | ICD-10-CM | POA: Diagnosis not present

## 2021-11-05 DIAGNOSIS — C50211 Malignant neoplasm of upper-inner quadrant of right female breast: Secondary | ICD-10-CM | POA: Diagnosis not present

## 2021-11-05 LAB — RAD ONC ARIA SESSION SUMMARY
Course Elapsed Days: 0
Plan Fractions Treated to Date: 1
Plan Prescribed Dose Per Fraction: 2.66 Gy
Plan Total Fractions Prescribed: 16
Plan Total Prescribed Dose: 42.56 Gy
Reference Point Dosage Given to Date: 2.66 Gy
Reference Point Session Dosage Given: 2.66 Gy
Session Number: 1

## 2021-11-06 ENCOUNTER — Encounter: Payer: Self-pay | Admitting: *Deleted

## 2021-11-06 ENCOUNTER — Ambulatory Visit
Admission: RE | Admit: 2021-11-06 | Discharge: 2021-11-06 | Disposition: A | Discharge status: Home / Self Care | Payer: Medicare PPO | Source: Ambulatory Visit | Attending: Radiation Oncology | Admitting: Radiation Oncology

## 2021-11-06 ENCOUNTER — Other Ambulatory Visit: Payer: Self-pay

## 2021-11-06 DIAGNOSIS — C50211 Malignant neoplasm of upper-inner quadrant of right female breast: Secondary | ICD-10-CM | POA: Diagnosis not present

## 2021-11-06 DIAGNOSIS — Z170 Estrogen receptor positive status [ER+]: Secondary | ICD-10-CM | POA: Diagnosis not present

## 2021-11-06 DIAGNOSIS — Z510 Encounter for antineoplastic radiation therapy: Secondary | ICD-10-CM | POA: Diagnosis not present

## 2021-11-06 DIAGNOSIS — C50911 Malignant neoplasm of unspecified site of right female breast: Secondary | ICD-10-CM | POA: Diagnosis not present

## 2021-11-06 DIAGNOSIS — L905 Scar conditions and fibrosis of skin: Secondary | ICD-10-CM | POA: Diagnosis not present

## 2021-11-06 LAB — RAD ONC ARIA SESSION SUMMARY
Course Elapsed Days: 1
Plan Fractions Treated to Date: 2
Plan Prescribed Dose Per Fraction: 2.66 Gy
Plan Total Fractions Prescribed: 16
Plan Total Prescribed Dose: 42.56 Gy
Reference Point Dosage Given to Date: 5.32 Gy
Reference Point Session Dosage Given: 2.66 Gy
Session Number: 2

## 2021-11-07 ENCOUNTER — Other Ambulatory Visit: Payer: Self-pay

## 2021-11-07 ENCOUNTER — Ambulatory Visit
Admission: RE | Admit: 2021-11-07 | Discharge: 2021-11-07 | Disposition: A | Discharge status: Home / Self Care | Payer: Medicare PPO | Source: Ambulatory Visit | Attending: Radiation Oncology | Admitting: Radiation Oncology

## 2021-11-07 DIAGNOSIS — Z510 Encounter for antineoplastic radiation therapy: Secondary | ICD-10-CM | POA: Diagnosis not present

## 2021-11-07 DIAGNOSIS — C50211 Malignant neoplasm of upper-inner quadrant of right female breast: Secondary | ICD-10-CM | POA: Diagnosis not present

## 2021-11-07 DIAGNOSIS — L905 Scar conditions and fibrosis of skin: Secondary | ICD-10-CM | POA: Diagnosis not present

## 2021-11-07 DIAGNOSIS — C50911 Malignant neoplasm of unspecified site of right female breast: Secondary | ICD-10-CM | POA: Diagnosis not present

## 2021-11-07 DIAGNOSIS — Z170 Estrogen receptor positive status [ER+]: Secondary | ICD-10-CM | POA: Diagnosis not present

## 2021-11-07 LAB — RAD ONC ARIA SESSION SUMMARY
Course Elapsed Days: 2
Plan Fractions Treated to Date: 3
Plan Prescribed Dose Per Fraction: 2.66 Gy
Plan Total Fractions Prescribed: 16
Plan Total Prescribed Dose: 42.56 Gy
Reference Point Dosage Given to Date: 7.98 Gy
Reference Point Session Dosage Given: 2.66 Gy
Session Number: 3

## 2021-11-10 ENCOUNTER — Other Ambulatory Visit: Payer: Self-pay

## 2021-11-10 ENCOUNTER — Ambulatory Visit
Admission: RE | Admit: 2021-11-10 | Discharge: 2021-11-10 | Disposition: A | Discharge status: Home / Self Care | Payer: Medicare PPO | Source: Ambulatory Visit | Attending: Radiation Oncology | Admitting: Radiation Oncology

## 2021-11-10 DIAGNOSIS — L905 Scar conditions and fibrosis of skin: Secondary | ICD-10-CM | POA: Diagnosis not present

## 2021-11-10 DIAGNOSIS — Z510 Encounter for antineoplastic radiation therapy: Secondary | ICD-10-CM | POA: Diagnosis not present

## 2021-11-10 DIAGNOSIS — C50211 Malignant neoplasm of upper-inner quadrant of right female breast: Secondary | ICD-10-CM | POA: Diagnosis not present

## 2021-11-10 DIAGNOSIS — C50911 Malignant neoplasm of unspecified site of right female breast: Secondary | ICD-10-CM | POA: Diagnosis not present

## 2021-11-10 DIAGNOSIS — Z170 Estrogen receptor positive status [ER+]: Secondary | ICD-10-CM | POA: Diagnosis not present

## 2021-11-10 LAB — RAD ONC ARIA SESSION SUMMARY
Course Elapsed Days: 5
Plan Fractions Treated to Date: 4
Plan Prescribed Dose Per Fraction: 2.66 Gy
Plan Total Fractions Prescribed: 16
Plan Total Prescribed Dose: 42.56 Gy
Reference Point Dosage Given to Date: 10.64 Gy
Reference Point Session Dosage Given: 2.66 Gy
Session Number: 4

## 2021-11-11 ENCOUNTER — Ambulatory Visit
Admission: RE | Admit: 2021-11-11 | Discharge: 2021-11-11 | Disposition: A | Discharge status: Home / Self Care | Payer: Medicare PPO | Source: Ambulatory Visit | Attending: Radiation Oncology | Admitting: Radiation Oncology

## 2021-11-11 ENCOUNTER — Other Ambulatory Visit: Payer: Self-pay

## 2021-11-11 DIAGNOSIS — C50211 Malignant neoplasm of upper-inner quadrant of right female breast: Secondary | ICD-10-CM | POA: Diagnosis not present

## 2021-11-11 DIAGNOSIS — Z510 Encounter for antineoplastic radiation therapy: Secondary | ICD-10-CM | POA: Diagnosis not present

## 2021-11-11 DIAGNOSIS — Z170 Estrogen receptor positive status [ER+]: Secondary | ICD-10-CM | POA: Diagnosis not present

## 2021-11-11 DIAGNOSIS — L905 Scar conditions and fibrosis of skin: Secondary | ICD-10-CM | POA: Diagnosis not present

## 2021-11-11 DIAGNOSIS — C50911 Malignant neoplasm of unspecified site of right female breast: Secondary | ICD-10-CM | POA: Diagnosis not present

## 2021-11-11 LAB — RAD ONC ARIA SESSION SUMMARY
Course Elapsed Days: 6
Plan Fractions Treated to Date: 5
Plan Prescribed Dose Per Fraction: 2.66 Gy
Plan Total Fractions Prescribed: 16
Plan Total Prescribed Dose: 42.56 Gy
Reference Point Dosage Given to Date: 13.3 Gy
Reference Point Session Dosage Given: 2.66 Gy
Session Number: 5

## 2021-11-12 ENCOUNTER — Other Ambulatory Visit: Payer: Self-pay

## 2021-11-12 ENCOUNTER — Ambulatory Visit
Admission: RE | Admit: 2021-11-12 | Discharge: 2021-11-12 | Disposition: A | Discharge status: Home / Self Care | Payer: Medicare PPO | Source: Ambulatory Visit | Attending: Radiation Oncology | Admitting: Radiation Oncology

## 2021-11-12 ENCOUNTER — Inpatient Hospital Stay: Payer: Medicare PPO | Attending: Oncology

## 2021-11-12 ENCOUNTER — Encounter: Payer: Self-pay | Admitting: Occupational Therapy

## 2021-11-12 ENCOUNTER — Inpatient Hospital Stay: Payer: Medicare PPO | Admitting: Occupational Therapy

## 2021-11-12 DIAGNOSIS — L905 Scar conditions and fibrosis of skin: Secondary | ICD-10-CM

## 2021-11-12 DIAGNOSIS — C50211 Malignant neoplasm of upper-inner quadrant of right female breast: Secondary | ICD-10-CM | POA: Diagnosis not present

## 2021-11-12 DIAGNOSIS — Z510 Encounter for antineoplastic radiation therapy: Secondary | ICD-10-CM | POA: Diagnosis not present

## 2021-11-12 DIAGNOSIS — Z170 Estrogen receptor positive status [ER+]: Secondary | ICD-10-CM | POA: Diagnosis not present

## 2021-11-12 DIAGNOSIS — C50911 Malignant neoplasm of unspecified site of right female breast: Secondary | ICD-10-CM | POA: Diagnosis not present

## 2021-11-12 LAB — RAD ONC ARIA SESSION SUMMARY
Course Elapsed Days: 7
Plan Fractions Treated to Date: 6
Plan Prescribed Dose Per Fraction: 2.66 Gy
Plan Total Fractions Prescribed: 16
Plan Total Prescribed Dose: 42.56 Gy
Reference Point Dosage Given to Date: 15.96 Gy
Reference Point Session Dosage Given: 2.66 Gy
Session Number: 6

## 2021-11-12 LAB — CBC
HCT: 43.4 % (ref 36.0–46.0)
Hemoglobin: 14.8 g/dL (ref 12.0–15.0)
MCH: 32.8 pg (ref 26.0–34.0)
MCHC: 34.1 g/dL (ref 30.0–36.0)
MCV: 96.2 fL (ref 80.0–100.0)
Platelets: 220 10*3/uL (ref 150–400)
RBC: 4.51 MIL/uL (ref 3.87–5.11)
RDW: 13 % (ref 11.5–15.5)
WBC: 4 10*3/uL (ref 4.0–10.5)
nRBC: 0 % (ref 0.0–0.2)

## 2021-11-12 NOTE — Therapy (Signed)
[place] [facility] [place] [facility] [address] [place], [address] Phone: [phone]   Fax:  [phone]  Occupational Therapy Evaluation  Patient Details  Name: Kristen Reid MRN: [id number] Date of Birth: 1950/05/07 Referring Provider (OT): DR Joaquin Courts Date: 11/12/2021   OT End of Session - 11/12/21 2128     Visit Number 1    Number of Visits 6    Date for OT Re-Evaluation 01/08/22    OT Start Time 1228    OT Stop Time 1302    OT Time Calculation (min) 34 min    Activity Tolerance Patient tolerated treatment well    Behavior During Therapy [facility] for tasks assessed/performed             Past Medical History:  Diagnosis Date   Arthritis    back hands   Breast cancer ([place])    Breast mass, right    Cancer ([place])    Cataract    GERD (gastroesophageal reflux disease)    Hyperlipidemia    Hypertension     Past Surgical History:  Procedure Laterality Date   BASAL CELL CARCINOMA EXCISION  02/03/2021   on nose   BREAST BIOPSY Right 2010 and 04/18/2015   BREAST BIOPSY Right 10/26/2019   x clip path pending mass w/ distortion   BREAST LUMPECTOMY WITH RADIOACTIVE SEED LOCALIZATION Right 08/20/2021   Procedure: RIGHT BREAST LUMPECTOMY WITH RADIOACTIVE SEED LOCALIZATION;  Surgeon: Jovita Kussmaul, MD;  Location: [place];  Service: General;  Laterality: Right;   CATARACT EXTRACTION Right 04/23/2021   CATARACT EXTRACTION Left 05/04/2021   CESAREAN SECTION     corneal saltzman nodules removed Left 03/28/2021   ECTOPIC PREGNANCY SURGERY  1981   2   EYE SURGERY Right    keratectomy    ROBOTIC ASSISTED LAPAROSCOPIC OVARIAN CYSTECTOMY     SENTINEL NODE BIOPSY Right 10/02/2021   Procedure: RIGHT AXILLARY SENTINEL NODE BIOPSY;  Surgeon: Jovita Kussmaul, MD;  Location: [place];  Service: General;  Laterality: Right;   TONSILECTOMY/ADENOIDECTOMY WITH MYRINGOTOMY  1960    There were  no vitals filed for this visit.   Subjective Assessment - 11/12/21 2112     Subjective  I am doing well I started radiation about a week ago.  Perhaps lymphatic cording under my right breast.  The surgeon referred me to you.  As well as the last week my scar is getting deeper in my axilla and tight.  With 2 hard areas    Pertinent History 10/28/21 seen by surgeon and refer to therapy - Pt with diagnosis right breast cancer. The patient is a 71 year old white female who is about 2 1/2 months status post right breast lumpectomy and sentinel node biopsy for a T1CN0 right breast cancer that was ER and PR positive and HER2 negative with a Ki-67 of 30%. Her margins are clean and her nodes are negative. She does report some feeling of a rope on the underside of the right breast. She also reports some bruising and some swelling in the right axilla. and d She denies any fevers or chills. There is some discoloration of the central portion of the right breast secondary to injection of the tracer. There appears to be some cording on the underside of the right breast. There also appears to be some mild swelling in the right axilla but this does not seem large enough to aspirate. There is some slight  discoloration of the skin medial to this. Did prescribe antibiotic- pt started radiation 11/04/21    Patient Stated Goals I just want to scar tissue better and we can help me with the cording under my breast that he does not feel as tight.    Currently in Pain? No/denies               [facility] OT Assessment - 11/12/21 0001       Assessment   Medical Diagnosis Breast CA/lumpectomy with cording    Referring Provider (OT) DR Marlou Starks    Onset Date/Surgical Date 10/02/21    Hand Dominance Right    Next MD Visit end of Oct      Home  Environment   Lives With Spouse      Prior Function   Vocation Full time employment    Leisure on computer, play golf, kayak, walk, read      AROM   Overall AROM Comments Overhead range  pull in axiall and under R breast              LYMPHEDEMA/ONCOLOGY QUESTIONNAIRE - 11/12/21 0001       Type   Cancer Type R breast CA      Surgeries   Lumpectomy Date 10/02/21    Number Lymph Nodes Removed -2      Treatment   Active Radiation Treatment Yes    Body Site R breast      What other symptoms do you have   Are you having Pain No    Are you having pitting edema No    Is it Hard or Difficult finding clothes that fit No    Do you have infections No      Right Upper Extremity Lymphedema   15 cm Proximal to Olecranon Process 29.5 cm    10 cm Proximal to Olecranon Process 26 cm    Olecranon Process 23 cm      Left Upper Extremity Lymphedema   15 cm Proximal to Olecranon Process 28.5 cm    10 cm Proximal to Olecranon Process 25.5 cm    Olecranon Process 23 cm                            OT Education - 11/12/21 2128     Education Details Findings of evaluation.  Education and scar massage and done treatment for lumbar facet cording under right breast with great success.    Person(s) Educated Patient    Methods Explanation;Demonstration;Tactile cues;Verbal cues    Comprehension Verbal cues required;Returned demonstration;Verbalized understanding                 OT Long Term Goals - 11/12/21 2133       OT LONG TERM GOAL #1   Title Patient scar adhesions improved for patient to be symptom-free with right dominant upper extremity overhead range of motion to perform overhead reaching , pulling overhead sweater    Baseline Patient presents with lymphatic cording several below right breast.  Was able to release 95% from.  Scar adhesion in the right axilla adhere patient is on scar massage.  We will feel a pull overhead.    Time 8    Period Weeks    Status New    Target Date 01/07/22                   Plan - 11/12/21 2129     Clinical Impression  Statement Patient presented OT evaluation postop right lumpectomy for breast  cancer on 10/02/2021.  Patient started radiation last week.  On right breast.  Patient referred for lymphatic under right axilla with great success.  Under right breast.  As well as some scar tissue in axilla with adhesions.  Patient mostly feeling scar tissue at endrange R UE overhead shoulder range of motion.  Patient is right-hand dominant patient denies pain.  Did assess bilateral upper extremity circumference with no signs of lymphedema.  Patient risk for lymphedema.  OT was able to to release lymphatic cording under right breast with great success.  Did educate patient on scar massage with scar adhesions.  Patient to follow-up with me in 2 weeks time.  We will assess how radiation is doing and skin tolerance-if need to hold off on any soft tissue.  Patient can benefit from OT services to decrease lymphatic cording and scar adhesions to increase in range of motion and discomfort to return to prior level of function.    OT Occupational Profile and History Problem Focused Assessment - Including review of records relating to presenting problem    Occupational performance deficits (Please refer to evaluation for details): ADL's;IADL's;Leisure    Body Structure / Function / Physical Skills ADL;Fascial restriction;Flexibility;Scar mobility;ROM    Rehab Potential Good    Clinical Decision Making Limited treatment options, no task modification necessary    Comorbidities Affecting Occupational Performance: None    Modification or Assistance to Complete Evaluation  No modification of tasks or assist necessary to complete eval    OT Frequency --   1 x wk to biweekly   OT Duration 8 weeks    OT Treatment/Interventions Self-care/ADL training;Manual Therapy;Patient/family education;Therapeutic exercise;Scar mobilization    Consulted and Agree with Plan of Care Patient             Patient will benefit from skilled therapeutic intervention in order to improve the following deficits and impairments:   Body  Structure / Function / Physical Skills: ADL, Fascial restriction, Flexibility, Scar mobility, ROM       Visit Diagnosis: Scar tissue    Problem List Patient Active Problem List   Diagnosis Date Noted   Goals of care, counseling/discussion 09/22/2021   Malignant neoplasm of upper-outer quadrant of right breast in female, estrogen receptor positive ([place]) 09/12/2021   Abnormal mammogram of left breast 12/10/2020    Rosalyn Gess, OTR/L,CLT 11/12/2021, 9:38 PM  [place] [facility] [address] [place], [address] Phone: [phone]   Fax:  [phone]  Name: MYLI PAE MRN: [id number] Date of Birth: 07-25-1950

## 2021-11-13 ENCOUNTER — Ambulatory Visit
Admission: RE | Admit: 2021-11-13 | Discharge: 2021-11-13 | Disposition: A | Discharge status: Home / Self Care | Payer: Medicare PPO | Source: Ambulatory Visit | Attending: Radiation Oncology | Admitting: Radiation Oncology

## 2021-11-13 ENCOUNTER — Other Ambulatory Visit: Payer: Self-pay

## 2021-11-13 DIAGNOSIS — Z170 Estrogen receptor positive status [ER+]: Secondary | ICD-10-CM | POA: Diagnosis not present

## 2021-11-13 DIAGNOSIS — L905 Scar conditions and fibrosis of skin: Secondary | ICD-10-CM | POA: Diagnosis not present

## 2021-11-13 DIAGNOSIS — C50211 Malignant neoplasm of upper-inner quadrant of right female breast: Secondary | ICD-10-CM | POA: Diagnosis not present

## 2021-11-13 DIAGNOSIS — Z510 Encounter for antineoplastic radiation therapy: Secondary | ICD-10-CM | POA: Diagnosis not present

## 2021-11-13 DIAGNOSIS — C50911 Malignant neoplasm of unspecified site of right female breast: Secondary | ICD-10-CM | POA: Diagnosis not present

## 2021-11-13 LAB — RAD ONC ARIA SESSION SUMMARY
Course Elapsed Days: 8
Plan Fractions Treated to Date: 7
Plan Prescribed Dose Per Fraction: 2.66 Gy
Plan Total Fractions Prescribed: 16
Plan Total Prescribed Dose: 42.56 Gy
Reference Point Dosage Given to Date: 18.62 Gy
Reference Point Session Dosage Given: 2.66 Gy
Session Number: 7

## 2021-11-13 NOTE — Addendum Note (Signed)
Addended by: Rosalyn Gess on: 11/13/2021 09:32 AM   Modules accepted: Orders

## 2021-11-14 ENCOUNTER — Other Ambulatory Visit: Payer: Self-pay

## 2021-11-14 ENCOUNTER — Ambulatory Visit
Admission: RE | Admit: 2021-11-14 | Discharge: 2021-11-14 | Disposition: A | Discharge status: Home / Self Care | Payer: Medicare PPO | Source: Ambulatory Visit | Attending: Radiation Oncology | Admitting: Radiation Oncology

## 2021-11-14 DIAGNOSIS — Z510 Encounter for antineoplastic radiation therapy: Secondary | ICD-10-CM | POA: Diagnosis not present

## 2021-11-14 DIAGNOSIS — C50911 Malignant neoplasm of unspecified site of right female breast: Secondary | ICD-10-CM | POA: Diagnosis not present

## 2021-11-14 DIAGNOSIS — C50211 Malignant neoplasm of upper-inner quadrant of right female breast: Secondary | ICD-10-CM | POA: Diagnosis not present

## 2021-11-14 DIAGNOSIS — L905 Scar conditions and fibrosis of skin: Secondary | ICD-10-CM | POA: Diagnosis not present

## 2021-11-14 DIAGNOSIS — Z170 Estrogen receptor positive status [ER+]: Secondary | ICD-10-CM | POA: Diagnosis not present

## 2021-11-14 LAB — RAD ONC ARIA SESSION SUMMARY
Course Elapsed Days: 9
Plan Fractions Treated to Date: 8
Plan Prescribed Dose Per Fraction: 2.66 Gy
Plan Total Fractions Prescribed: 16
Plan Total Prescribed Dose: 42.56 Gy
Reference Point Dosage Given to Date: 21.28 Gy
Reference Point Session Dosage Given: 2.66 Gy
Session Number: 8

## 2021-11-17 ENCOUNTER — Ambulatory Visit (INDEPENDENT_AMBULATORY_CARE_PROVIDER_SITE_OTHER): Payer: Medicare PPO

## 2021-11-17 ENCOUNTER — Other Ambulatory Visit: Payer: Self-pay

## 2021-11-17 ENCOUNTER — Ambulatory Visit
Admission: RE | Admit: 2021-11-17 | Discharge: 2021-11-17 | Disposition: A | Discharge status: Home / Self Care | Payer: Medicare PPO | Source: Ambulatory Visit | Attending: Radiation Oncology | Admitting: Radiation Oncology

## 2021-11-17 DIAGNOSIS — L905 Scar conditions and fibrosis of skin: Secondary | ICD-10-CM | POA: Diagnosis not present

## 2021-11-17 DIAGNOSIS — C50211 Malignant neoplasm of upper-inner quadrant of right female breast: Secondary | ICD-10-CM | POA: Diagnosis not present

## 2021-11-17 DIAGNOSIS — Z170 Estrogen receptor positive status [ER+]: Secondary | ICD-10-CM | POA: Diagnosis not present

## 2021-11-17 DIAGNOSIS — E538 Deficiency of other specified B group vitamins: Secondary | ICD-10-CM | POA: Diagnosis not present

## 2021-11-17 DIAGNOSIS — C50911 Malignant neoplasm of unspecified site of right female breast: Secondary | ICD-10-CM | POA: Diagnosis not present

## 2021-11-17 DIAGNOSIS — Z510 Encounter for antineoplastic radiation therapy: Secondary | ICD-10-CM | POA: Diagnosis not present

## 2021-11-17 LAB — RAD ONC ARIA SESSION SUMMARY
Course Elapsed Days: 12
Plan Fractions Treated to Date: 9
Plan Prescribed Dose Per Fraction: 2.66 Gy
Plan Total Fractions Prescribed: 16
Plan Total Prescribed Dose: 42.56 Gy
Reference Point Dosage Given to Date: 23.94 Gy
Reference Point Session Dosage Given: 2.66 Gy
Session Number: 9

## 2021-11-17 MED ORDER — CYANOCOBALAMIN 1000 MCG/ML IJ SOLN
1000.0000 ug | Freq: Once | INTRAMUSCULAR | Status: AC
  Administered 2021-11-17: 1000 ug via INTRAMUSCULAR

## 2021-11-18 ENCOUNTER — Ambulatory Visit
Admission: RE | Admit: 2021-11-18 | Discharge: 2021-11-18 | Disposition: A | Discharge status: Home / Self Care | Payer: Medicare PPO | Source: Ambulatory Visit | Attending: Radiation Oncology | Admitting: Radiation Oncology

## 2021-11-18 ENCOUNTER — Other Ambulatory Visit: Payer: Self-pay

## 2021-11-18 DIAGNOSIS — L905 Scar conditions and fibrosis of skin: Secondary | ICD-10-CM | POA: Diagnosis not present

## 2021-11-18 DIAGNOSIS — C50911 Malignant neoplasm of unspecified site of right female breast: Secondary | ICD-10-CM | POA: Diagnosis not present

## 2021-11-18 DIAGNOSIS — Z170 Estrogen receptor positive status [ER+]: Secondary | ICD-10-CM | POA: Diagnosis not present

## 2021-11-18 DIAGNOSIS — Z510 Encounter for antineoplastic radiation therapy: Secondary | ICD-10-CM | POA: Diagnosis not present

## 2021-11-18 DIAGNOSIS — C50211 Malignant neoplasm of upper-inner quadrant of right female breast: Secondary | ICD-10-CM | POA: Diagnosis not present

## 2021-11-18 LAB — RAD ONC ARIA SESSION SUMMARY
Course Elapsed Days: 13
Plan Fractions Treated to Date: 10
Plan Prescribed Dose Per Fraction: 2.66 Gy
Plan Total Fractions Prescribed: 16
Plan Total Prescribed Dose: 42.56 Gy
Reference Point Dosage Given to Date: 26.6 Gy
Reference Point Session Dosage Given: 2.66 Gy
Session Number: 10

## 2021-11-19 ENCOUNTER — Inpatient Hospital Stay: Payer: Medicare PPO | Admitting: Occupational Therapy

## 2021-11-19 ENCOUNTER — Ambulatory Visit
Admission: RE | Admit: 2021-11-19 | Discharge: 2021-11-19 | Disposition: A | Discharge status: Home / Self Care | Payer: Medicare PPO | Source: Ambulatory Visit | Attending: Radiation Oncology | Admitting: Radiation Oncology

## 2021-11-19 ENCOUNTER — Other Ambulatory Visit: Payer: Self-pay

## 2021-11-19 DIAGNOSIS — C50211 Malignant neoplasm of upper-inner quadrant of right female breast: Secondary | ICD-10-CM | POA: Diagnosis not present

## 2021-11-19 DIAGNOSIS — L905 Scar conditions and fibrosis of skin: Secondary | ICD-10-CM

## 2021-11-19 DIAGNOSIS — Z170 Estrogen receptor positive status [ER+]: Secondary | ICD-10-CM | POA: Diagnosis not present

## 2021-11-19 DIAGNOSIS — C50911 Malignant neoplasm of unspecified site of right female breast: Secondary | ICD-10-CM | POA: Diagnosis not present

## 2021-11-19 DIAGNOSIS — Z510 Encounter for antineoplastic radiation therapy: Secondary | ICD-10-CM | POA: Diagnosis not present

## 2021-11-19 LAB — RAD ONC ARIA SESSION SUMMARY
Course Elapsed Days: 14
Plan Fractions Treated to Date: 11
Plan Prescribed Dose Per Fraction: 2.66 Gy
Plan Total Fractions Prescribed: 16
Plan Total Prescribed Dose: 42.56 Gy
Reference Point Dosage Given to Date: 29.26 Gy
Reference Point Session Dosage Given: 2.66 Gy
Session Number: 11

## 2021-11-19 NOTE — Therapy (Signed)
[facility] [address] [place], [address] Phone: [phone]   Fax:  [phone]  Occupational Therapy Treatment  Patient Details  Name: Kristen Reid MRN: [id number] Date of Birth: 17-Mar-1950 Referring Provider (OT): DR Joaquin Courts Date: 11/19/2021   OT End of Session - 11/19/21 1812     Visit Number 2    Number of Visits 6    Date for OT Re-Evaluation 01/08/22    OT Start Time 5374    OT Stop Time 1247    OT Time Calculation (min) 16 min    Activity Tolerance Patient tolerated treatment well    Behavior During Therapy [facility] for tasks assessed/performed             Past Medical History:  Diagnosis Date   Arthritis    back hands   Breast cancer ([place])    Breast mass, right    Cancer ([place])    Cataract    GERD (gastroesophageal reflux disease)    Hyperlipidemia    Hypertension     Past Surgical History:  Procedure Laterality Date   BASAL CELL CARCINOMA EXCISION  02/03/2021   on nose   BREAST BIOPSY Right 2010 and 04/18/2015   BREAST BIOPSY Right 10/26/2019   x clip path pending mass w/ distortion   BREAST LUMPECTOMY WITH RADIOACTIVE SEED LOCALIZATION Right 08/20/2021   Procedure: RIGHT BREAST LUMPECTOMY WITH RADIOACTIVE SEED LOCALIZATION;  Surgeon: Jovita Kussmaul, MD;  Location: [place];  Service: General;  Laterality: Right;   CATARACT EXTRACTION Right 04/23/2021   CATARACT EXTRACTION Left 05/04/2021   CESAREAN SECTION     corneal saltzman nodules removed Left 03/28/2021   ECTOPIC PREGNANCY SURGERY  1981   2   EYE SURGERY Right    keratectomy    ROBOTIC ASSISTED LAPAROSCOPIC OVARIAN CYSTECTOMY     SENTINEL NODE BIOPSY Right 10/02/2021   Procedure: RIGHT AXILLARY SENTINEL NODE BIOPSY;  Surgeon: Jovita Kussmaul, MD;  Location: [place];  Service: General;  Laterality: Right;   TONSILECTOMY/ADENOIDECTOMY WITH MYRINGOTOMY  1960    There were  no vitals filed for this visit.   Subjective Assessment - 11/19/21 1811     Subjective  Doing much better I may be felt like 1 normal corked under the right breast since last time but nothing came back.  Is just the scar is still deep in my right armpit.  That is standing color now also with radiation    Pertinent History 10/28/21 seen by surgeon and refer to therapy - Pt with diagnosis right breast cancer. The patient is a 71 year old white female who is about 70 months status post right breast lumpectomy and sentinel node biopsy for a T1CN0 right breast cancer that was ER and PR positive and HER2 negative with a Ki-67 of 30%. Her margins are clean and her nodes are negative. She does report some feeling of a rope on the underside of the right breast. She also reports some bruising and some swelling in the right axilla. and d She denies any fevers or chills. There is some discoloration of the central portion of the right breast secondary to injection of the tracer. There appears to be some cording on the underside of the right breast. There also appears to be some mild swelling in the right axilla but this does not seem large enough to aspirate. There is some slight discoloration of the skin medial  to this. Did prescribe antibiotic- pt started radiation 11/04/21    Patient Stated Goals I just want to scar tissue better and we can help me with the cording under my breast that he does not feel as tight.    Currently in Pain? No/denies                  Patient arrive reporting she is about 12 sessions into radiation close to halfway.  Scar still adhere in right axilla.  But because of radiation and skin turning red recommend at this time that we do not do any soft tissue or scar mobilization.  We will reevaluate after radiation is done. Patient to arrive with 2 small lymphatic cords under right breast.  Successfully released by soft tissue myofascial stretches by OT. Patient was able to maintain  progress with lymphatic cording under right breast from evaluation.  We will reassess again after patient is finished with radiation.                  OT Education - 11/19/21 1812     Education Details Reviewed with patient again scar massage but to hold off with radiation and lymphatic cording released again    Person(s) Educated Patient    Methods Explanation;Demonstration;Tactile cues;Verbal cues    Comprehension Verbal cues required;Returned demonstration;Verbalized understanding                 OT Long Term Goals - 11/12/21 2133       OT LONG TERM GOAL #1   Title Patient scar adhesions improved for patient to be symptom-free with right dominant upper extremity overhead range of motion to perform overhead reaching , pulling overhead sweater    Baseline Patient presents with lymphatic cording several below right breast.  Was able to release 95% from.  Scar adhesion in the right axilla adhere patient is on scar massage.  We will feel a pull overhead.    Time 8    Period Weeks    Status New    Target Date 01/07/22                   Plan - 11/19/21 1813     Clinical Impression Statement Patient presented OT evaluation postop right lumpectomy for breast cancer on 10/02/2021.  Patient reports that she is about 12 sessions into radiation close to halfway on right breast.  Patient referred for lymphatic cording under right breast and scar tissue right axilla.  Last visit did great with releasing cording under right breast.  As well as some scar tissue in axilla with adhesions.  Patient had to little areas of cording under right breast this date released with great success..  Patient is right-hand dominant patient denies pain.  Did assess bilateral upper extremity circumference with no signs of lymphedema with evaluation.  Patient still with scar adhesion deeper in right axilla.  But patient to hold off on any scar mobilization and OT needs to hold off until after  radiation and will reassess.  Patient can benefit from OT services to decrease lymphatic cording and scar adhesions to increase in range of motion and discomfort to return to prior level of function.  We will follow-up with me after radiation again.    OT Occupational Profile and History Problem Focused Assessment - Including review of records relating to presenting problem    Occupational performance deficits (Please refer to evaluation for details): ADL's;IADL's;Leisure    Body Structure / Function / Physical  Skills ADL;Fascial restriction;Flexibility;Scar mobility;ROM    Rehab Potential Good    Clinical Decision Making Limited treatment options, no task modification necessary    Comorbidities Affecting Occupational Performance: None    Modification or Assistance to Complete Evaluation  No modification of tasks or assist necessary to complete eval    OT Frequency Monthly    OT Duration 8 weeks    OT Treatment/Interventions Self-care/ADL training;Manual Therapy;Patient/family education;Therapeutic exercise;Scar mobilization    Consulted and Agree with Plan of Care Patient             Patient will benefit from skilled therapeutic intervention in order to improve the following deficits and impairments:   Body Structure / Function / Physical Skills: ADL, Fascial restriction, Flexibility, Scar mobility, ROM       Visit Diagnosis: Scar tissue    Problem List Patient Active Problem List   Diagnosis Date Noted   Goals of care, counseling/discussion 09/22/2021   Malignant neoplasm of upper-outer quadrant of right breast in female, estrogen receptor positive ([place]) 09/12/2021   Abnormal mammogram of left breast 12/10/2020    Rosalyn Gess, OTR/L,CLT 11/19/2021, 6:16 PM  [facility] [address], [place], [address] Phone: [phone]   Fax:  [phone]  Name: Kristen Reid MRN: [id number] Date of  Birth: [birth year]

## 2021-11-20 ENCOUNTER — Ambulatory Visit
Admission: RE | Admit: 2021-11-20 | Discharge: 2021-11-20 | Disposition: A | Discharge status: Home / Self Care | Payer: Medicare PPO | Source: Ambulatory Visit | Attending: Radiation Oncology | Admitting: Radiation Oncology

## 2021-11-20 ENCOUNTER — Other Ambulatory Visit: Payer: Self-pay

## 2021-11-20 DIAGNOSIS — C50211 Malignant neoplasm of upper-inner quadrant of right female breast: Secondary | ICD-10-CM | POA: Diagnosis not present

## 2021-11-20 DIAGNOSIS — Z170 Estrogen receptor positive status [ER+]: Secondary | ICD-10-CM | POA: Diagnosis not present

## 2021-11-20 DIAGNOSIS — C50911 Malignant neoplasm of unspecified site of right female breast: Secondary | ICD-10-CM | POA: Diagnosis not present

## 2021-11-20 DIAGNOSIS — Z510 Encounter for antineoplastic radiation therapy: Secondary | ICD-10-CM | POA: Diagnosis not present

## 2021-11-20 DIAGNOSIS — L905 Scar conditions and fibrosis of skin: Secondary | ICD-10-CM | POA: Diagnosis not present

## 2021-11-20 LAB — RAD ONC ARIA SESSION SUMMARY
Course Elapsed Days: 15
Plan Fractions Treated to Date: 12
Plan Prescribed Dose Per Fraction: 2.66 Gy
Plan Total Fractions Prescribed: 16
Plan Total Prescribed Dose: 42.56 Gy
Reference Point Dosage Given to Date: 31.92 Gy
Reference Point Session Dosage Given: 2.66 Gy
Session Number: 12

## 2021-11-21 ENCOUNTER — Ambulatory Visit
Admission: RE | Admit: 2021-11-21 | Discharge: 2021-11-21 | Disposition: A | Discharge status: Home / Self Care | Payer: Medicare PPO | Source: Ambulatory Visit | Attending: Radiation Oncology | Admitting: Radiation Oncology

## 2021-11-21 ENCOUNTER — Other Ambulatory Visit: Payer: Self-pay

## 2021-11-21 DIAGNOSIS — C50211 Malignant neoplasm of upper-inner quadrant of right female breast: Secondary | ICD-10-CM | POA: Diagnosis not present

## 2021-11-21 DIAGNOSIS — Z510 Encounter for antineoplastic radiation therapy: Secondary | ICD-10-CM | POA: Diagnosis not present

## 2021-11-21 DIAGNOSIS — C50911 Malignant neoplasm of unspecified site of right female breast: Secondary | ICD-10-CM | POA: Diagnosis not present

## 2021-11-21 DIAGNOSIS — L905 Scar conditions and fibrosis of skin: Secondary | ICD-10-CM | POA: Diagnosis not present

## 2021-11-21 DIAGNOSIS — Z170 Estrogen receptor positive status [ER+]: Secondary | ICD-10-CM | POA: Diagnosis not present

## 2021-11-21 LAB — RAD ONC ARIA SESSION SUMMARY
Course Elapsed Days: 16
Plan Fractions Treated to Date: 13
Plan Prescribed Dose Per Fraction: 2.66 Gy
Plan Total Fractions Prescribed: 16
Plan Total Prescribed Dose: 42.56 Gy
Reference Point Dosage Given to Date: 34.58 Gy
Reference Point Session Dosage Given: 2.66 Gy
Session Number: 13

## 2021-11-24 ENCOUNTER — Other Ambulatory Visit: Payer: Self-pay

## 2021-11-24 ENCOUNTER — Ambulatory Visit
Admission: RE | Admit: 2021-11-24 | Discharge: 2021-11-24 | Disposition: A | Discharge status: Home / Self Care | Payer: Medicare PPO | Source: Ambulatory Visit | Attending: Radiation Oncology | Admitting: Radiation Oncology

## 2021-11-24 DIAGNOSIS — C50911 Malignant neoplasm of unspecified site of right female breast: Secondary | ICD-10-CM | POA: Diagnosis not present

## 2021-11-24 DIAGNOSIS — Z170 Estrogen receptor positive status [ER+]: Secondary | ICD-10-CM | POA: Diagnosis not present

## 2021-11-24 DIAGNOSIS — C50211 Malignant neoplasm of upper-inner quadrant of right female breast: Secondary | ICD-10-CM | POA: Diagnosis not present

## 2021-11-24 DIAGNOSIS — Z510 Encounter for antineoplastic radiation therapy: Secondary | ICD-10-CM | POA: Diagnosis not present

## 2021-11-24 DIAGNOSIS — L905 Scar conditions and fibrosis of skin: Secondary | ICD-10-CM | POA: Diagnosis not present

## 2021-11-24 LAB — RAD ONC ARIA SESSION SUMMARY
Course Elapsed Days: 19
Plan Fractions Treated to Date: 14
Plan Prescribed Dose Per Fraction: 2.66 Gy
Plan Total Fractions Prescribed: 16
Plan Total Prescribed Dose: 42.56 Gy
Reference Point Dosage Given to Date: 37.24 Gy
Reference Point Session Dosage Given: 2.66 Gy
Session Number: 14

## 2021-11-25 ENCOUNTER — Ambulatory Visit
Admission: RE | Admit: 2021-11-25 | Discharge: 2021-11-25 | Disposition: A | Discharge status: Home / Self Care | Payer: Medicare PPO | Source: Ambulatory Visit | Attending: Radiation Oncology | Admitting: Radiation Oncology

## 2021-11-25 ENCOUNTER — Other Ambulatory Visit: Payer: Self-pay

## 2021-11-25 DIAGNOSIS — C50911 Malignant neoplasm of unspecified site of right female breast: Secondary | ICD-10-CM | POA: Diagnosis not present

## 2021-11-25 DIAGNOSIS — L905 Scar conditions and fibrosis of skin: Secondary | ICD-10-CM | POA: Diagnosis not present

## 2021-11-25 DIAGNOSIS — Z170 Estrogen receptor positive status [ER+]: Secondary | ICD-10-CM | POA: Diagnosis not present

## 2021-11-25 DIAGNOSIS — Z510 Encounter for antineoplastic radiation therapy: Secondary | ICD-10-CM | POA: Diagnosis not present

## 2021-11-25 DIAGNOSIS — C50211 Malignant neoplasm of upper-inner quadrant of right female breast: Secondary | ICD-10-CM | POA: Diagnosis not present

## 2021-11-25 LAB — RAD ONC ARIA SESSION SUMMARY
Course Elapsed Days: 20
Plan Fractions Treated to Date: 15
Plan Prescribed Dose Per Fraction: 2.66 Gy
Plan Total Fractions Prescribed: 16
Plan Total Prescribed Dose: 42.56 Gy
Reference Point Dosage Given to Date: 39.9 Gy
Reference Point Session Dosage Given: 2.66 Gy
Session Number: 15

## 2021-11-26 ENCOUNTER — Inpatient Hospital Stay: Payer: Medicare PPO

## 2021-11-26 ENCOUNTER — Ambulatory Visit
Admission: RE | Admit: 2021-11-26 | Discharge: 2021-11-26 | Disposition: A | Discharge status: Home / Self Care | Payer: Medicare PPO | Source: Ambulatory Visit | Attending: Radiation Oncology | Admitting: Radiation Oncology

## 2021-11-26 ENCOUNTER — Other Ambulatory Visit: Payer: Self-pay

## 2021-11-26 DIAGNOSIS — C50211 Malignant neoplasm of upper-inner quadrant of right female breast: Secondary | ICD-10-CM | POA: Diagnosis not present

## 2021-11-26 DIAGNOSIS — Z510 Encounter for antineoplastic radiation therapy: Secondary | ICD-10-CM | POA: Diagnosis not present

## 2021-11-26 DIAGNOSIS — C50911 Malignant neoplasm of unspecified site of right female breast: Secondary | ICD-10-CM | POA: Diagnosis not present

## 2021-11-26 DIAGNOSIS — Z170 Estrogen receptor positive status [ER+]: Secondary | ICD-10-CM | POA: Diagnosis not present

## 2021-11-26 DIAGNOSIS — L905 Scar conditions and fibrosis of skin: Secondary | ICD-10-CM | POA: Diagnosis not present

## 2021-11-26 LAB — RAD ONC ARIA SESSION SUMMARY
Course Elapsed Days: 21
Plan Fractions Treated to Date: 16
Plan Prescribed Dose Per Fraction: 2.66 Gy
Plan Total Fractions Prescribed: 16
Plan Total Prescribed Dose: 42.56 Gy
Reference Point Dosage Given to Date: 42.56 Gy
Reference Point Session Dosage Given: 2.66 Gy
Session Number: 16

## 2021-11-26 LAB — CBC
HCT: 42.7 % (ref 36.0–46.0)
Hemoglobin: 14.9 g/dL (ref 12.0–15.0)
MCH: 32.5 pg (ref 26.0–34.0)
MCHC: 34.9 g/dL (ref 30.0–36.0)
MCV: 93 fL (ref 80.0–100.0)
Platelets: 215 10*3/uL (ref 150–400)
RBC: 4.59 MIL/uL (ref 3.87–5.11)
RDW: 12.3 % (ref 11.5–15.5)
WBC: 3.8 10*3/uL — ABNORMAL LOW (ref 4.0–10.5)
nRBC: 0 % (ref 0.0–0.2)

## 2021-11-27 ENCOUNTER — Other Ambulatory Visit: Payer: Self-pay

## 2021-11-27 ENCOUNTER — Ambulatory Visit
Admission: RE | Admit: 2021-11-27 | Discharge: 2021-11-27 | Disposition: A | Discharge status: Home / Self Care | Payer: Medicare PPO | Source: Ambulatory Visit | Attending: Radiation Oncology | Admitting: Radiation Oncology

## 2021-11-27 DIAGNOSIS — Z510 Encounter for antineoplastic radiation therapy: Secondary | ICD-10-CM | POA: Diagnosis not present

## 2021-11-27 DIAGNOSIS — C50211 Malignant neoplasm of upper-inner quadrant of right female breast: Secondary | ICD-10-CM | POA: Diagnosis not present

## 2021-11-27 DIAGNOSIS — L905 Scar conditions and fibrosis of skin: Secondary | ICD-10-CM | POA: Diagnosis not present

## 2021-11-27 LAB — RAD ONC ARIA SESSION SUMMARY
Course Elapsed Days: 22
Plan Fractions Treated to Date: 1
Plan Prescribed Dose Per Fraction: 2 Gy
Plan Total Fractions Prescribed: 8
Plan Total Prescribed Dose: 16 Gy
Reference Point Dosage Given to Date: 2 Gy
Reference Point Session Dosage Given: 2 Gy
Session Number: 17

## 2021-11-28 ENCOUNTER — Ambulatory Visit
Admission: RE | Admit: 2021-11-28 | Discharge: 2021-11-28 | Disposition: A | Discharge status: Home / Self Care | Payer: Medicare PPO | Source: Ambulatory Visit | Attending: Radiation Oncology | Admitting: Radiation Oncology

## 2021-11-28 ENCOUNTER — Other Ambulatory Visit: Payer: Self-pay

## 2021-11-28 DIAGNOSIS — Z510 Encounter for antineoplastic radiation therapy: Secondary | ICD-10-CM | POA: Diagnosis not present

## 2021-11-28 DIAGNOSIS — L905 Scar conditions and fibrosis of skin: Secondary | ICD-10-CM | POA: Diagnosis not present

## 2021-11-28 DIAGNOSIS — C50211 Malignant neoplasm of upper-inner quadrant of right female breast: Secondary | ICD-10-CM | POA: Diagnosis not present

## 2021-11-28 LAB — RAD ONC ARIA SESSION SUMMARY
Course Elapsed Days: 23
Plan Fractions Treated to Date: 2
Plan Prescribed Dose Per Fraction: 2 Gy
Plan Total Fractions Prescribed: 8
Plan Total Prescribed Dose: 16 Gy
Reference Point Dosage Given to Date: 4 Gy
Reference Point Session Dosage Given: 2 Gy
Session Number: 18

## 2021-12-01 ENCOUNTER — Ambulatory Visit
Admission: RE | Admit: 2021-12-01 | Discharge: 2021-12-01 | Disposition: A | Discharge status: Home / Self Care | Payer: Medicare PPO | Source: Ambulatory Visit | Attending: Radiation Oncology | Admitting: Radiation Oncology

## 2021-12-01 ENCOUNTER — Ambulatory Visit: Payer: Medicare PPO

## 2021-12-01 ENCOUNTER — Other Ambulatory Visit: Payer: Self-pay

## 2021-12-01 DIAGNOSIS — C50211 Malignant neoplasm of upper-inner quadrant of right female breast: Secondary | ICD-10-CM | POA: Insufficient documentation

## 2021-12-01 LAB — RAD ONC ARIA SESSION SUMMARY
Course Elapsed Days: 26
Plan Fractions Treated to Date: 3
Plan Prescribed Dose Per Fraction: 2 Gy
Plan Total Fractions Prescribed: 8
Plan Total Prescribed Dose: 16 Gy
Reference Point Dosage Given to Date: 6 Gy
Reference Point Session Dosage Given: 2 Gy
Session Number: 19

## 2021-12-02 ENCOUNTER — Ambulatory Visit
Admission: RE | Admit: 2021-12-02 | Discharge: 2021-12-02 | Disposition: A | Discharge status: Home / Self Care | Payer: Medicare PPO | Source: Ambulatory Visit | Attending: Radiation Oncology | Admitting: Radiation Oncology

## 2021-12-02 ENCOUNTER — Other Ambulatory Visit: Payer: Self-pay

## 2021-12-02 ENCOUNTER — Ambulatory Visit: Payer: Medicare PPO

## 2021-12-02 DIAGNOSIS — C50211 Malignant neoplasm of upper-inner quadrant of right female breast: Secondary | ICD-10-CM | POA: Diagnosis not present

## 2021-12-02 LAB — RAD ONC ARIA SESSION SUMMARY
Course Elapsed Days: 27
Plan Fractions Treated to Date: 4
Plan Prescribed Dose Per Fraction: 2 Gy
Plan Total Fractions Prescribed: 8
Plan Total Prescribed Dose: 16 Gy
Reference Point Dosage Given to Date: 8 Gy
Reference Point Session Dosage Given: 2 Gy
Session Number: 20

## 2021-12-03 ENCOUNTER — Ambulatory Visit
Admission: RE | Admit: 2021-12-03 | Discharge: 2021-12-03 | Disposition: A | Discharge status: Home / Self Care | Payer: Medicare PPO | Source: Ambulatory Visit | Attending: Radiation Oncology | Admitting: Radiation Oncology

## 2021-12-03 ENCOUNTER — Other Ambulatory Visit: Payer: Self-pay

## 2021-12-03 DIAGNOSIS — C50911 Malignant neoplasm of unspecified site of right female breast: Secondary | ICD-10-CM | POA: Diagnosis not present

## 2021-12-03 DIAGNOSIS — C50211 Malignant neoplasm of upper-inner quadrant of right female breast: Secondary | ICD-10-CM | POA: Diagnosis not present

## 2021-12-03 DIAGNOSIS — Z170 Estrogen receptor positive status [ER+]: Secondary | ICD-10-CM | POA: Diagnosis not present

## 2021-12-03 DIAGNOSIS — Z510 Encounter for antineoplastic radiation therapy: Secondary | ICD-10-CM | POA: Diagnosis not present

## 2021-12-03 LAB — RAD ONC ARIA SESSION SUMMARY
Course Elapsed Days: 28
Plan Fractions Treated to Date: 5
Plan Prescribed Dose Per Fraction: 2 Gy
Plan Total Fractions Prescribed: 8
Plan Total Prescribed Dose: 16 Gy
Reference Point Dosage Given to Date: 10 Gy
Reference Point Session Dosage Given: 2 Gy
Session Number: 21

## 2021-12-04 ENCOUNTER — Ambulatory Visit
Admission: RE | Admit: 2021-12-04 | Discharge: 2021-12-04 | Disposition: A | Discharge status: Home / Self Care | Payer: Medicare PPO | Source: Ambulatory Visit | Attending: Radiation Oncology | Admitting: Radiation Oncology

## 2021-12-04 ENCOUNTER — Other Ambulatory Visit: Payer: Self-pay

## 2021-12-04 DIAGNOSIS — C50211 Malignant neoplasm of upper-inner quadrant of right female breast: Secondary | ICD-10-CM | POA: Diagnosis not present

## 2021-12-04 LAB — RAD ONC ARIA SESSION SUMMARY
Course Elapsed Days: 29
Plan Fractions Treated to Date: 6
Plan Prescribed Dose Per Fraction: 2 Gy
Plan Total Fractions Prescribed: 8
Plan Total Prescribed Dose: 16 Gy
Reference Point Dosage Given to Date: 12 Gy
Reference Point Session Dosage Given: 2 Gy
Session Number: 22

## 2021-12-05 ENCOUNTER — Other Ambulatory Visit: Payer: Self-pay

## 2021-12-05 ENCOUNTER — Ambulatory Visit
Admission: RE | Admit: 2021-12-05 | Discharge: 2021-12-05 | Disposition: A | Discharge status: Home / Self Care | Payer: Medicare PPO | Source: Ambulatory Visit | Attending: Radiation Oncology | Admitting: Radiation Oncology

## 2021-12-05 DIAGNOSIS — C50211 Malignant neoplasm of upper-inner quadrant of right female breast: Secondary | ICD-10-CM | POA: Diagnosis not present

## 2021-12-05 LAB — RAD ONC ARIA SESSION SUMMARY
Course Elapsed Days: 30
Plan Fractions Treated to Date: 7
Plan Prescribed Dose Per Fraction: 2 Gy
Plan Total Fractions Prescribed: 8
Plan Total Prescribed Dose: 16 Gy
Reference Point Dosage Given to Date: 14 Gy
Reference Point Session Dosage Given: 2 Gy
Session Number: 23

## 2021-12-08 ENCOUNTER — Other Ambulatory Visit: Payer: Self-pay

## 2021-12-08 ENCOUNTER — Ambulatory Visit: Payer: Medicare PPO

## 2021-12-08 ENCOUNTER — Ambulatory Visit
Admission: RE | Admit: 2021-12-08 | Discharge: 2021-12-08 | Disposition: A | Discharge status: Home / Self Care | Payer: Medicare PPO | Source: Ambulatory Visit | Attending: Radiation Oncology | Admitting: Radiation Oncology

## 2021-12-08 DIAGNOSIS — C50211 Malignant neoplasm of upper-inner quadrant of right female breast: Secondary | ICD-10-CM | POA: Diagnosis not present

## 2021-12-08 LAB — RAD ONC ARIA SESSION SUMMARY
Course Elapsed Days: 33
Plan Fractions Treated to Date: 8
Plan Prescribed Dose Per Fraction: 2 Gy
Plan Total Fractions Prescribed: 8
Plan Total Prescribed Dose: 16 Gy
Reference Point Dosage Given to Date: 16 Gy
Reference Point Session Dosage Given: 2 Gy
Session Number: 24

## 2021-12-09 ENCOUNTER — Ambulatory Visit: Payer: Medicare PPO

## 2021-12-10 ENCOUNTER — Ambulatory Visit: Payer: Medicare PPO

## 2021-12-10 ENCOUNTER — Inpatient Hospital Stay: Payer: Medicare PPO | Attending: Oncology | Admitting: Occupational Therapy

## 2021-12-10 DIAGNOSIS — L905 Scar conditions and fibrosis of skin: Secondary | ICD-10-CM | POA: Diagnosis not present

## 2021-12-10 NOTE — Therapy (Signed)
[facility] [address] [place], [address] Phone: [phone]   Fax:  [phone]  Occupational Therapy Treatment  Patient Details  Name: Kristen Reid MRN: [id number] Date of Birth: [birth year] Referring Provider (OT): DR Joaquin Courts Date: 12/10/2021   OT End of Session - 12/10/21 1416     Visit Number 3    Number of Visits 6    Date for OT Re-Evaluation 01/08/22    OT Start Time 2641    OT Stop Time 1105    OT Time Calculation (min) 25 min    Activity Tolerance Patient tolerated treatment well    Behavior During Therapy [facility] for tasks assessed/performed             Past Medical History:  Diagnosis Date   Arthritis    back hands   Breast cancer ([place])    Breast mass, right    Cancer ([place])    Cataract    GERD (gastroesophageal reflux disease)    Hyperlipidemia    Hypertension     Past Surgical History:  Procedure Laterality Date   BASAL CELL CARCINOMA EXCISION  02/03/2021   on nose   BREAST BIOPSY Right 2010 and 04/18/2015   BREAST BIOPSY Right 10/26/2019   x clip path pending mass w/ distortion   BREAST LUMPECTOMY WITH RADIOACTIVE SEED LOCALIZATION Right 08/20/2021   Procedure: RIGHT BREAST LUMPECTOMY WITH RADIOACTIVE SEED LOCALIZATION;  Surgeon: Jovita Kussmaul, MD;  Location: [place];  Service: General;  Laterality: Right;   CATARACT EXTRACTION Right 04/23/2021   CATARACT EXTRACTION Left 05/04/2021   CESAREAN SECTION     corneal saltzman nodules removed Left 03/28/2021   ECTOPIC PREGNANCY SURGERY  1981   2   EYE SURGERY Right    keratectomy    ROBOTIC ASSISTED LAPAROSCOPIC OVARIAN CYSTECTOMY     SENTINEL NODE BIOPSY Right 10/02/2021   Procedure: RIGHT AXILLARY SENTINEL NODE BIOPSY;  Surgeon: Jovita Kussmaul, MD;  Location: [place];  Service: General;  Laterality: Right;   TONSILECTOMY/ADENOIDECTOMY WITH MYRINGOTOMY  1960    There were  no vitals filed for this visit.   Subjective Assessment - 12/10/21 1415     Subjective  I am done with radiation.  Just tired.  But still working.  I feel like there is a little bit of cording couple of places under my right breast and then at times may be in the upper arm.  I feel like that scar is weeping a little bit in my axilla.  But I do no it was just my imagination.    Pertinent History 10/28/21 seen by surgeon and refer to therapy - Pt with diagnosis right breast cancer. The patient is a 71 year old white female who is about 2 1/2 months status post right breast lumpectomy and sentinel node biopsy for a T1CN0 right breast cancer that was ER and PR positive and HER2 negative with a Ki-67 of 30%. Her margins are clean and her nodes are negative. She does report some feeling of a rope on the underside of the right breast. She also reports some bruising and some swelling in the right axilla. and d She denies any fevers or chills. There is some discoloration of the central portion of the right breast secondary to injection of the tracer. There appears to be some cording on the underside of the right breast. There also appears to be some mild swelling in  the right axilla but this does not seem large enough to aspirate. There is some slight discoloration of the skin medial to this. Did prescribe antibiotic- pt started radiation 11/04/21    Patient Stated Goals I just want to scar tissue better and we can help me with the cording under my breast that he does not feel as tight.    Currently in Pain? No/denies                 Patient arrived this date for follow-up after finishing up radiation.  Patient was seen initially for lymphatic cording under right breast referred by surgeon.  Had great release in 2 sessions for releasing of lymphatic cording prior to radiation.  Patient active range of motion in right shoulder within normal limits.  Patient do show 2-3 lymphatic cords under right breast again  but able to release it this date. Appear patient do have one lymphatic cord still at axilla into the upper arm unable to release.  Patient still tender and little swollen in the axilla from postradiation. Patient feels like her scar and axilla is weeping a little bit.  Prepare more for OT that because of puffiness moisture is trapped in the fold of the scar.  Patient to try and position her arm to air axilla little bit as well as if indicated postradiation can do local icing. Patient to follow-up with me in a month again and will reassess if any more need for release of lymphatic cords. Encourage patient to return to prior level of function gradually with exercise. Bilateral circumference of upper extremities measured and patient within normal limits-no signs and symptoms of lymphedema and patient risk for lymphedema is very low.                   OT Education - 12/10/21 1416     Education Details Reviewed with patient again scar massage but to hold off with radiation and lymphatic cording released again    Person(s) Educated Patient    Methods Explanation;Demonstration;Tactile cues;Verbal cues    Comprehension Verbal cues required;Returned demonstration;Verbalized understanding                 OT Long Term Goals - 11/12/21 2133       OT LONG TERM GOAL #1   Title Patient scar adhesions improved for patient to be symptom-free with right dominant upper extremity overhead range of motion to perform overhead reaching , pulling overhead sweater    Baseline Patient presents with lymphatic cording several below right breast.  Was able to release 95% from.  Scar adhesion in the right axilla adhere patient is on scar massage.  We will feel a pull overhead.    Time 8    Period Weeks    Status New    Target Date 01/07/22                   Plan - 12/10/21 1417     Clinical Impression Statement Patient presented OT evaluation postop right lumpectomy for breast cancer  on 10/02/2021.  Patient returned this date after finishing up with radiation on right breast..  Patient was originally referred by surgeon to OT for lymphatic cording under right breast and scar tissue right axilla.  Patient done great the last 2 visits  with releasing  of cording under right breast.  As well as some scar tissue in axilla with adhesions.  Patient is right-hand dominant patient denies pain.  Did assess bilateral  upper extremity circumference with no signs of lymphedema and riks low.  Patient still with scar adhesion deeper in right axilla but with radiation just finishing up patient has still a little bit of puffiness.  An incision wants to stay moist.  Recommend may be for patient to irrigate a little bit and ice if indicated.  Cannot do any manual therapy on axilla and breast until about 4 to 6 weeks post last radiation session.  Patient did had 3 more cording's under the breast that was easily released with great success.  Patient can follow-up with me if needed in about a month.  Right shoulder active range of motion is within normal limits.    OT Occupational Profile and History Problem Focused Assessment - Including review of records relating to presenting problem    Occupational performance deficits (Please refer to evaluation for details): ADL's;IADL's;Leisure    Body Structure / Function / Physical Skills ADL;Fascial restriction;Flexibility;Scar mobility;ROM    Rehab Potential Good    Clinical Decision Making Limited treatment options, no task modification necessary    Comorbidities Affecting Occupational Performance: None    Modification or Assistance to Complete Evaluation  No modification of tasks or assist necessary to complete eval    OT Frequency Monthly    OT Duration 8 weeks    OT Treatment/Interventions Self-care/ADL training;Manual Therapy;Patient/family education;Therapeutic exercise;Scar mobilization    Consulted and Agree with Plan of Care Patient              Patient will benefit from skilled therapeutic intervention in order to improve the following deficits and impairments:   Body Structure / Function / Physical Skills: ADL, Fascial restriction, Flexibility, Scar mobility, ROM       Visit Diagnosis: Scar tissue    Problem List Patient Active Problem List   Diagnosis Date Noted   Goals of care, counseling/discussion 09/22/2021   Malignant neoplasm of upper-outer quadrant of right breast in female, estrogen receptor positive ([place]) 09/12/2021   Abnormal mammogram of left breast 12/10/2020    Rosalyn Gess, OTR/L,CLT 12/10/2021, 2:21 PM  [facility] [address] [place], [address] Phone: [phone]   Fax:  [phone]  Name: Kristen Reid MRN: [id number] Date of Birth: 19-Jun-1950

## 2021-12-15 ENCOUNTER — Ambulatory Visit (INDEPENDENT_AMBULATORY_CARE_PROVIDER_SITE_OTHER): Payer: Medicare PPO

## 2021-12-15 DIAGNOSIS — E538 Deficiency of other specified B group vitamins: Secondary | ICD-10-CM

## 2021-12-15 MED ORDER — CYANOCOBALAMIN 1000 MCG/ML IJ SOLN
1000.0000 ug | Freq: Once | INTRAMUSCULAR | Status: AC
  Administered 2021-12-15: 1000 ug via INTRAMUSCULAR

## 2021-12-17 DIAGNOSIS — Z85828 Personal history of other malignant neoplasm of skin: Secondary | ICD-10-CM | POA: Diagnosis not present

## 2021-12-17 DIAGNOSIS — L668 Other cicatricial alopecia: Secondary | ICD-10-CM | POA: Diagnosis not present

## 2021-12-17 DIAGNOSIS — X32XXXA Exposure to sunlight, initial encounter: Secondary | ICD-10-CM | POA: Diagnosis not present

## 2021-12-17 DIAGNOSIS — D2272 Melanocytic nevi of left lower limb, including hip: Secondary | ICD-10-CM | POA: Diagnosis not present

## 2021-12-17 DIAGNOSIS — D225 Melanocytic nevi of trunk: Secondary | ICD-10-CM | POA: Diagnosis not present

## 2021-12-17 DIAGNOSIS — D2261 Melanocytic nevi of right upper limb, including shoulder: Secondary | ICD-10-CM | POA: Diagnosis not present

## 2021-12-22 ENCOUNTER — Encounter: Payer: Self-pay | Admitting: Oncology

## 2021-12-22 ENCOUNTER — Inpatient Hospital Stay: Payer: Medicare PPO | Attending: Oncology | Admitting: Oncology

## 2021-12-22 DIAGNOSIS — Z923 Personal history of irradiation: Secondary | ICD-10-CM | POA: Insufficient documentation

## 2021-12-22 DIAGNOSIS — K219 Gastro-esophageal reflux disease without esophagitis: Secondary | ICD-10-CM | POA: Diagnosis not present

## 2021-12-22 DIAGNOSIS — Z170 Estrogen receptor positive status [ER+]: Secondary | ICD-10-CM | POA: Insufficient documentation

## 2021-12-22 DIAGNOSIS — C50411 Malignant neoplasm of upper-outer quadrant of right female breast: Secondary | ICD-10-CM

## 2021-12-22 DIAGNOSIS — Z79811 Long term (current) use of aromatase inhibitors: Secondary | ICD-10-CM | POA: Diagnosis not present

## 2021-12-22 DIAGNOSIS — I10 Essential (primary) hypertension: Secondary | ICD-10-CM | POA: Insufficient documentation

## 2021-12-22 DIAGNOSIS — C50211 Malignant neoplasm of upper-inner quadrant of right female breast: Secondary | ICD-10-CM | POA: Diagnosis not present

## 2021-12-22 DIAGNOSIS — Z79890 Hormone replacement therapy (postmenopausal): Secondary | ICD-10-CM | POA: Insufficient documentation

## 2021-12-22 DIAGNOSIS — E785 Hyperlipidemia, unspecified: Secondary | ICD-10-CM | POA: Diagnosis not present

## 2021-12-22 MED ORDER — ANASTROZOLE 1 MG PO TABS: 1.0000 mg | ORAL_TABLET | Freq: Every day | ORAL | 2 refills | Status: DC

## 2021-12-22 NOTE — Progress Notes (Signed)
Hematology/Oncology Progress note [phone] [phone]            Patient Care Team: Jonetta Osgood, NP as PCP - General (Nurse Practitioner) Daiva Huge, RN as Oncology Nurse Navigator  ASSESSMENT & PLAN:   Cancer Staging  Malignant neoplasm of upper-outer quadrant of right breast in female, estrogen receptor positive ([place]) Staging form: Breast, AJCC 8th Edition - Pathologic stage from 10/02/2021: Stage IA (pT1c, pN0, cM0, G2, ER+, PR+, HER2-, Oncotype DX score: 18) - Signed by Earlie Server, MD on 12/22/2021   Malignant neoplasm of upper-outer quadrant of right breast in female, estrogen receptor positive ([place]) Right upper breast invasive carcinoma plus DCIS, ER/PR positive, HER2 negative.  Ki-67 20%. Axillary LN negative.  pT1c pN0 Oncotype DX 18, no adjuvant chemotherapy.  S/p Adjuvant Radiation.  Rationale of using aromatase inhibitor -Arimidex  discussed with patient.  Side effects of Arimidex including but not limited to hot flush, joint pain, fatigue, mood swing, osteoporosis discussed with patient. Patient voices understanding and willing to proceed Rx was sent to pharmacy.    Orders Placed This Encounter  Procedures   CBC with Differential/Platelet    Standing Status:   Future    Standing Expiration Date:   12/23/2022   Comprehensive metabolic panel    Standing Status:   Future    Standing Expiration Date:   12/22/2022   All questions were answered. The patient knows to call the clinic with any problems, questions or concerns. We spent sufficient time to discuss many aspect of care, questions were answered to patient's satisfaction. A total of 25 minutes was spent on this visit.  With 10 minutes spent reviewing image findings, pathology reports, 10 minutes counseling the patient on the diagnosis, side effects of the endocrine treatment, management of symptoms.  Additional 5 minutes was spent on answering patient's questions.   Earlie Server, MD,  PhD [facility] Hematology Oncology 12/22/2021    CHIEF COMPLAINTS/REASON FOR VISIT:  Follow up for Stage I breast cancer  HISTORY OF PRESENTING ILLNESS:   Kristen Reid is a  71 y.o.  female with PMH listed below was seen in consultation at the request of  Jonetta Osgood, NP  for evaluation of right breast cancer Oncology history listed as below Oncology History  Malignant neoplasm of upper-outer quadrant of right breast in female, estrogen receptor positive ([place])  10/10/2019 Mammogram   Unilateral breast mammogram showed partially obscured mass with associated architectural distortion within the upper slightly inner right breast.  Without definitive sonographic correlates.  Stereotactic biopsy was recommended.   10/03/2020 Imaging   1. Stable appearance of RIGHT breast architectural distortion which demonstrated fibrosis and fibrocystic change. X clip is in appropriate position. Recommend surgical consultation for consideration of excision versus close monitoring. 2. No mammographic evidence of malignancy in the LEFT breast   05/02/2021 Mammogram   Unilateral diagnostic mammogram right 1. Possible slight increase in size of the distorted mass at the site of the X shaped biopsy marking clip in the upper-inner right breast. 2.  No evidence of right axillary lymphadenopathy.   09/12/2021 Initial Diagnosis   Malignant neoplasm of upper-outer quadrant of right breast in female, estrogen receptor positive ([place])  -10/06/2019 right breast superior stereotactic core needle biopsy showed benign mammary parenchyma with stromal fibrosis, fibrocystic changes, and focal usual ductal hyperplasia.  Negative for atypical proliferative breast disease. -08/20/2021, right breast lumpectomy showed invasive moderately differentiated ductal adenocarcinoma, grade 2, associated with DCIS in situ, intermediate nuclear grade,  solid type with focal necrosis.  Margins free (invasive tumor 1 mm from superior  margin; DCIS 1 mm from superior margin) pT1c pNx    10/02/2021 Cancer Staging   Staging form: Breast, AJCC 8th Edition - Pathologic stage from 10/02/2021: Stage IA (pT1c, pN0, cM0, G2, ER+, PR+, HER2-, Oncotype DX score: 18) - Signed by Earlie Server, MD on 12/22/2021 Stage prefix: Initial diagnosis Multigene prognostic tests performed: Oncotype DX Recurrence score range: Greater than or equal to 11 Histologic grading system: 3 grade system   10/02/2021 Surgery   Patient underwent sentinel lymph node biopsy.  All 4 lymph nodes are negative.   10/06/2021 Oncotype testing   Oncotype DX recurrence score 18, distant recurrence risk at 9 years 5%, group average absolute chemotherapy benefit less than 1%.    Menarche 71 years of age Menopause in 27s. Very short period of time of hormone replacement therapy, less than 1 year Previous OCP for couple of years. Denies any family history of breast cancer.  Great aunt was diagnosed with uterine cancer.  No other cancer family history.  INTERVAL HISTORY Kristen Reid is a 71 y.o. female who has above history reviewed by me today presents for follow up visit for Stage IA right breast cancer. S/p radiation. She tolerates well with mild skin toxicities.    Review of Systems  Constitutional:  Negative for appetite change, chills, fatigue and fever.  HENT:   Negative for hearing loss and voice change.   Eyes:  Negative for eye problems.  Respiratory:  Negative for chest tightness and cough.   Cardiovascular:  Negative for chest pain.  Gastrointestinal:  Negative for abdominal distention, abdominal pain and blood in stool.  Endocrine: Negative for hot flashes.  Genitourinary:  Negative for difficulty urinating and frequency.   Musculoskeletal:  Negative for arthralgias.  Skin:  Negative for itching and rash.       Irritated skin due to RT  Neurological:  Negative for extremity weakness.  Hematological:  Negative for adenopathy.   Psychiatric/Behavioral:  Negative for confusion.     MEDICAL HISTORY:  Past Medical History:  Diagnosis Date   Arthritis    back hands   Breast cancer ([place])    Breast mass, right    Cancer ([place])    Cataract    GERD (gastroesophageal reflux disease)    Hyperlipidemia    Hypertension     SURGICAL HISTORY: Past Surgical History:  Procedure Laterality Date   BASAL CELL CARCINOMA EXCISION  02/03/2021   on nose   BREAST BIOPSY Right 2010 and 04/18/2015   BREAST BIOPSY Right 10/26/2019   x clip path pending mass w/ distortion   BREAST LUMPECTOMY WITH RADIOACTIVE SEED LOCALIZATION Right 08/20/2021   Procedure: RIGHT BREAST LUMPECTOMY WITH RADIOACTIVE SEED LOCALIZATION;  Surgeon: Jovita Kussmaul, MD;  Location: [place];  Service: General;  Laterality: Right;   CATARACT EXTRACTION Right 04/23/2021   CATARACT EXTRACTION Left 05/04/2021   CESAREAN SECTION     corneal saltzman nodules removed Left 03/28/2021   ECTOPIC PREGNANCY SURGERY  1981   2   EYE SURGERY Right    keratectomy    ROBOTIC ASSISTED LAPAROSCOPIC OVARIAN CYSTECTOMY     SENTINEL NODE BIOPSY Right 10/02/2021   Procedure: RIGHT AXILLARY SENTINEL NODE BIOPSY;  Surgeon: Jovita Kussmaul, MD;  Location: [place];  Service: General;  Laterality: Right;   TONSILECTOMY/ADENOIDECTOMY WITH MYRINGOTOMY  1960    SOCIAL HISTORY: Social History   Socioeconomic History  Marital status: Married    Spouse name: Not on file   Number of children: Not on file   Years of education: Not on file   Highest education level: Not on file  Occupational History   Not on file  Tobacco Use   Smoking status: Never   Smokeless tobacco: Never  Vaping Use   Vaping Use: Never used  Substance and Sexual Activity   Alcohol use: Yes    Comment: occasionally / socially   Drug use: Never   Sexual activity: Yes    Birth control/protection: Post-menopausal  Other Topics Concern   Not on file  Social History  Narrative   Not on file   Social Determinants of Health   Financial Resource Strain: Not on file  Food Insecurity: Not on file  Transportation Needs: Not on file  Physical Activity: Not on file  Stress: Not on file  Social Connections: Not on file  Intimate Partner Violence: Not on file    FAMILY HISTORY: Family History  Problem Relation Age of Onset   Heart disease Mother    Stroke Father    Skin cancer Father    Diabetes Brother    Diabetes Brother    Cervical cancer Paternal Aunt    Breast cancer Neg Hx     ALLERGIES:  has No Known Allergies.  MEDICATIONS:  Current Outpatient Medications  Medication Sig Dispense Refill   anastrozole (ARIMIDEX) 1 MG tablet Take 1 tablet (1 mg total) by mouth daily. 30 tablet 2   Apple Cider Vinegar 300 MG TABS Take by mouth.     calcium carbonate (OS-CAL) 600 MG TABS tablet Take 600 mg by mouth 2 (two) times daily with a meal.     Cholecalciferol (VITAMIN D3) 2000 units TABS Take by mouth.     clobetasol (TEMOVATE) 0.05 % external solution APPLY TO AFFECTED AREA EVERY DAY AT BEDTIME     Coenzyme Q10 (COQ10) 200 MG CAPS Take by mouth.     dutasteride (AVODART) 0.5 MG capsule TAKE ONE PILL DAILY X 1 WEEK, THEN ONE PILL ONCE WEEKLY     famotidine (PEPCID) 20 MG tablet Take 1 tablet (20 mg total) by mouth daily. 90 tablet 3   fluticasone (VERAMYST) 27.5 MCG/SPRAY nasal spray Place 2 sprays into the nose daily.     Lifitegrast 5 % SOLN Apply to eye.     loratadine (CLARITIN) 10 MG tablet Take 10 mg by mouth daily.     MILK THISTLE PO Take by mouth.     Vit B12-Methionine-Inos-Chol SOLN Inject into the muscle every 30 (thirty) days.     No current facility-administered medications for this visit.     PHYSICAL EXAMINATION: ECOG PERFORMANCE STATUS: 1 - Symptomatic but completely ambulatory Vitals:   12/22/21 1030  BP: (!) 146/73  Pulse: 68  Resp: 18  Temp: 97.7 F (36.5 C)   Filed Weights   12/22/21 1030  Weight: 154 lb 4.8 oz  (70 kg)    Physical Exam Constitutional:      General: She is not in acute distress. HENT:     Head: Normocephalic and atraumatic.  Eyes:     General: No scleral icterus. Cardiovascular:     Rate and Rhythm: Normal rate and regular rhythm.     Heart sounds: Normal heart sounds.  Pulmonary:     Effort: Pulmonary effort is normal. No respiratory distress.     Breath sounds: No wheezing.  Abdominal:     General: Bowel sounds  are normal. There is no distension.     Palpations: Abdomen is soft.  Musculoskeletal:        General: No deformity. Normal range of motion.     Cervical back: Normal range of motion and neck supple.  Skin:    General: Skin is warm and dry.     Findings: No erythema or rash.  Neurological:     Mental Status: She is alert and oriented to person, place, and time. Mental status is at baseline.     Cranial Nerves: No cranial nerve deficit.     Coordination: Coordination normal.  Psychiatric:        Mood and Affect: Mood normal.     LABORATORY DATA:  I have reviewed the data as listed    Latest Ref Rng & Units 11/26/2021    1:05 PM 11/12/2021    1:18 PM 10/09/2021    8:08 AM  CBC  WBC 4.0 - 10.5 K/uL 3.8  4.0  4.0   Hemoglobin 12.0 - 15.0 g/dL 14.9  14.8  12.3   Hematocrit 36.0 - 46.0 % 42.7  43.4  36.5   Platelets 150 - 400 K/uL 215  220  236       Latest Ref Rng & Units 10/09/2021    8:08 AM 09/09/2020    1:01 PM 09/07/2019    8:54 AM  CMP  Glucose 70 - 99 mg/dL 102  99  87   BUN 8 - 27 mg/dL '11  11  10   ' Creatinine 0.57 - 1.00 mg/dL 0.65  0.63  0.62   Sodium 134 - 144 mmol/L 142  140  143   Potassium 3.5 - 5.2 mmol/L 4.3  4.0  4.1   Chloride 96 - 106 mmol/L 103  103  103   CO2 20 - 29 mmol/L '26  25  27   ' Calcium 8.7 - 10.3 mg/dL 9.8  10.0  9.3   Total Protein 6.0 - 8.5 g/dL 6.4  6.7  6.6   Total Bilirubin 0.0 - 1.2 mg/dL 1.3  0.9  0.6   Alkaline Phos 44 - 121 IU/L 68  57  52   AST 0 - 40 IU/L '23  17  18   ' ALT 0 - 32 IU/L '16  17  12       ' RADIOGRAPHIC STUDIES: I have personally reviewed the radiological images as listed and agreed with the findings in the report. No results found.

## 2021-12-22 NOTE — Assessment & Plan Note (Addendum)
Right upper breast invasive carcinoma plus DCIS, ER/PR positive, HER2 negative.  Ki-67 20%. Axillary LN negative.  pT1c pN0 Oncotype DX 18, no adjuvant chemotherapy.  S/p Adjuvant Radiation.  Rationale of using aromatase inhibitor -Arimidex  discussed with patient.  Side effects of Arimidex including but not limited to hot flush, joint pain, fatigue, mood swing, osteoporosis discussed with patient. Patient voices understanding and willing to proceed Rx was sent to pharmacy.

## 2021-12-24 ENCOUNTER — Encounter: Payer: Self-pay | Admitting: *Deleted

## 2022-01-07 ENCOUNTER — Inpatient Hospital Stay: Payer: Medicare PPO | Attending: Oncology | Admitting: Occupational Therapy

## 2022-01-07 DIAGNOSIS — L905 Scar conditions and fibrosis of skin: Secondary | ICD-10-CM | POA: Diagnosis not present

## 2022-01-07 NOTE — Therapy (Signed)
[facility] [address], [place], [address] Phone: [phone]   Fax:  [phone]  Occupational Therapy Treatment  Patient Details  Name: Kristen Reid MRN: [id number] Date of Birth: 1950-09-23 Referring Provider (OT): DR Joaquin Courts Date: 01/07/2022   OT End of Session - 01/07/22 1328     Visit Number 4    Number of Visits 6    Date for OT Re-Evaluation 01/08/22    OT Start Time 0930    OT Stop Time 1000    OT Time Calculation (min) 30 min    Activity Tolerance Patient tolerated treatment well    Behavior During Therapy [facility] for tasks assessed/performed             Past Medical History:  Diagnosis Date   Arthritis    back hands   Breast cancer ([place])    Breast mass, right    Cancer ([place])    Cataract    GERD (gastroesophageal reflux disease)    Hyperlipidemia    Hypertension     Past Surgical History:  Procedure Laterality Date   BASAL CELL CARCINOMA EXCISION  02/03/2021   on nose   BREAST BIOPSY Right 2010 and 04/18/2015   BREAST BIOPSY Right 10/26/2019   x clip path pending mass w/ distortion   BREAST LUMPECTOMY WITH RADIOACTIVE SEED LOCALIZATION Right 08/20/2021   Procedure: RIGHT BREAST LUMPECTOMY WITH RADIOACTIVE SEED LOCALIZATION;  Surgeon: Jovita Kussmaul, MD;  Location: [place];  Service: General;  Laterality: Right;   CATARACT EXTRACTION Right 04/23/2021   CATARACT EXTRACTION Left 05/04/2021   CESAREAN SECTION     corneal saltzman nodules removed Left 03/28/2021   ECTOPIC PREGNANCY SURGERY  1981   2   EYE SURGERY Right    keratectomy    ROBOTIC ASSISTED LAPAROSCOPIC OVARIAN CYSTECTOMY     SENTINEL NODE BIOPSY Right 10/02/2021   Procedure: RIGHT AXILLARY SENTINEL NODE BIOPSY;  Surgeon: Jovita Kussmaul, MD;  Location: [place];  Service: General;  Laterality: Right;   TONSILECTOMY/ADENOIDECTOMY WITH MYRINGOTOMY  1960    There were  no vitals filed for this visit.   Subjective Assessment - 01/07/22 1326     Subjective  The scar is much better and not draining anymore.  I told I felt a cord this morning in my arm.  Otherwise doing okay.  Was on vacation for a week with my daughter.    Pertinent History 10/28/21 seen by surgeon and refer to therapy - Pt with diagnosis right breast cancer. The patient is a 71 year old white female who is about 2 1/2 months status post right breast lumpectomy and sentinel node biopsy for a T1CN0 right breast cancer that was ER and PR positive and HER2 negative with a Ki-67 of 30%. Her margins are clean and her nodes are negative. She does report some feeling of a rope on the underside of the right breast. She also reports some bruising and some swelling in the right axilla. and d She denies any fevers or chills. There is some discoloration of the central portion of the right breast secondary to injection of the tracer. There appears to be some cording on the underside of the right breast. There also appears to be some mild swelling in the right axilla but this does not seem large enough to aspirate. There is some slight discoloration of the skin medial to this. Did prescribe antibiotic- pt started radiation  11/04/21    Patient Stated Goals I just want to scar tissue better and we can help me with the cording under my breast that he does not feel as tight.    Currently in Pain? No/denies                 LYMPHEDEMA/ONCOLOGY QUESTIONNAIRE - 01/07/22 0001       Right Upper Extremity Lymphedema   15 cm Proximal to Olecranon Process 29 cm    10 cm Proximal to Olecranon Process 26.5 cm    Olecranon Process 23 cm      Left Upper Extremity Lymphedema   15 cm Proximal to Olecranon Process 28.5 cm    10 cm Proximal to Olecranon Process 26 cm    Olecranon Process 22.5 cm             Patient arrived this date for follow-up after finishing up radiation. Month ago.  She is 3 months postop.    Patient was seen initially for lymphatic cording under right breast referred by surgeon.  Had great progress for releasing of lymphatic cording prior to radiation.  Patient active range of motion in right shoulder within normal limits.  Patient do show no lymphatic cords under right breast. Appear patient do have one lymphatic cord still at axilla into the upper arm unable to release.  But add for patient to do child's pose/prayer stretch for endrange shoulder flexion that should help with the release for endrange motion.   Patient feels like her scar in axilla is not weeping anymore. Patient can continue with some scar massage but also added for her kinesiotaping with the 100% pull ex over scar that she can use during the day.  Do not sleep with it and if redness secure skip a day or 2.  Patient can try that for a few weeks. Encourage patient to return to prior level of function gradually with exercise. Bilateral circumference of upper extremities measured and patient within normal limits. Patient doing very well met all goals.  Can continue with home program at home.   Can contact me if needed.                     OT Education - 01/07/22 1328     Education Details Reviewed with patient again scar mobilization as well as range of motion exercise this    Person(s) Educated Patient    Methods Explanation;Demonstration;Tactile cues;Verbal cues    Comprehension Verbal cues required;Returned demonstration;Verbalized understanding                 OT Long Term Goals - 01/07/22 1334       OT LONG TERM GOAL #1   Title Patient scar adhesions improved for patient to be symptom-free with right dominant upper extremity overhead range of motion to perform overhead reaching , pulling overhead sweater    Status Achieved                   Plan - 01/07/22 1329     Clinical Impression Statement Patient presented OT evaluation postop right lumpectomy for breast cancer on  10/02/2021.  Patient now about 3 months postop and is finished with radiation for about a month.   Patient was originally referred by surgeon to OT for lymphatic cording under right breast and scar tissue right axilla.  Patient done great  with releasing  of cording under right breast.  As well as some scar tissue in axilla  with adhesions.  Patient is right-hand dominant patient denies pain.  Did assess bilateral upper extremity circumference with no signs of lymphedema and riks is low.  Patient returned this date with great active range of motion and strength in bilateral upper extremity  -scar in axilla improving.  Still have a little bit puffiness.  Can cont with some scar massage- did educate patient on some Kinesiotape that she can attempt.  Also add child's pose /prayer stretch for shoulder flexion endrange.  Patient progress well - met all goals and can continue with home program.    OT Occupational Profile and History Problem Focused Assessment - Including review of records relating to presenting problem    Occupational performance deficits (Please refer to evaluation for details): ADL's;IADL's;Leisure    Body Structure / Function / Physical Skills ADL;Fascial restriction;Flexibility;Scar mobility;ROM    Rehab Potential Good    Clinical Decision Making Limited treatment options, no task modification necessary    Comorbidities Affecting Occupational Performance: None    Modification or Assistance to Complete Evaluation  No modification of tasks or assist necessary to complete eval    OT Treatment/Interventions Self-care/ADL training;Manual Therapy;Patient/family education;Therapeutic exercise;Scar mobilization    Consulted and Agree with Plan of Care Patient             Patient will benefit from skilled therapeutic intervention in order to improve the following deficits and impairments:   Body Structure / Function / Physical Skills: ADL, Fascial restriction, Flexibility, Scar mobility, ROM        Visit Diagnosis: Scar tissue    Problem List Patient Active Problem List   Diagnosis Date Noted   Goals of care, counseling/discussion 09/22/2021   Malignant neoplasm of upper-outer quadrant of right breast in female, estrogen receptor positive ([place]) 09/12/2021   Abnormal mammogram of left breast 12/10/2020    Rosalyn Gess, OTR/L,CLT 01/07/2022, 1:35 PM  [facility] [address] [place], [address] Phone: [phone]   Fax:  [phone]  Name: Kristen Reid MRN: [id number] Date of Birth: [birth year]

## 2022-01-10 LAB — HEPATIC FUNCTION PANEL
ALT: 24 IU/L (ref 0–32)
AST: 22 IU/L (ref 0–40)
Albumin: 4.3 g/dL (ref 3.9–4.9)
Alkaline Phosphatase: 63 IU/L (ref 44–121)
Bilirubin Total: 0.8 mg/dL (ref 0.0–1.2)
Bilirubin, Direct: 0.18 mg/dL (ref 0.00–0.40)
Total Protein: 6.5 g/dL (ref 6.0–8.5)

## 2022-01-13 ENCOUNTER — Ambulatory Visit: Payer: Medicare PPO | Admitting: Nurse Practitioner

## 2022-01-16 ENCOUNTER — Ambulatory Visit: Payer: Medicare PPO

## 2022-01-16 ENCOUNTER — Ambulatory Visit: Payer: Medicare PPO | Admitting: Nurse Practitioner

## 2022-01-16 ENCOUNTER — Encounter: Payer: Self-pay | Admitting: Nurse Practitioner

## 2022-01-16 VITALS — BP 140/68 | HR 64 | Temp 98.2°F | Resp 16 | Ht 62.5 in | Wt 156.2 lb

## 2022-01-16 DIAGNOSIS — I10 Essential (primary) hypertension: Secondary | ICD-10-CM

## 2022-01-16 DIAGNOSIS — Z2911 Encounter for prophylactic immunotherapy for respiratory syncytial virus (RSV): Secondary | ICD-10-CM

## 2022-01-16 DIAGNOSIS — E538 Deficiency of other specified B group vitamins: Secondary | ICD-10-CM | POA: Diagnosis not present

## 2022-01-16 MED ORDER — AREXVY 120 MCG/0.5ML IM SUSR: 0.5000 mL | Freq: Once | INTRAMUSCULAR | 0 refills | Status: AC

## 2022-01-16 MED ORDER — CYANOCOBALAMIN 1000 MCG/ML IJ SOLN
1000.0000 ug | Freq: Once | INTRAMUSCULAR | Status: AC
  Administered 2022-01-16: 1000 ug via INTRAMUSCULAR

## 2022-01-16 NOTE — Progress Notes (Unsigned)
[facility] [place], [place] [address]  Internal MEDICINE  Office Visit Note  Patient Name: Kristen Reid  [id number]  [id number]  Date of Service: 01/16/2022  Chief Complaint  Patient presents with   Follow-up   Gastroesophageal Reflux   Hyperlipidemia   Hypertension    HPI Kristen Reid presents for a follow-up visit for  140/68     Current Medication: Outpatient Encounter Medications as of 01/16/2022  Medication Sig   anastrozole (ARIMIDEX) 1 MG tablet Take 1 tablet (1 mg total) by mouth daily.   Apple Cider Vinegar 300 MG TABS Take by mouth.   calcium carbonate (OS-CAL) 600 MG TABS tablet Take 600 mg by mouth 2 (two) times daily with a meal.   Cholecalciferol (VITAMIN D3) 2000 units TABS Take by mouth.   clobetasol (TEMOVATE) 0.05 % external solution APPLY TO AFFECTED AREA EVERY DAY AT BEDTIME   Coenzyme Q10 (COQ10) 200 MG CAPS Take by mouth.   dutasteride (AVODART) 0.5 MG capsule TAKE ONE PILL DAILY X 1 WEEK, THEN ONE PILL ONCE WEEKLY   famotidine (PEPCID) 20 MG tablet Take 1 tablet (20 mg total) by mouth daily.   fluticasone (VERAMYST) 27.5 MCG/SPRAY nasal spray Place 2 sprays into the nose daily.   Lifitegrast 5 % SOLN Apply to eye.   loratadine (CLARITIN) 10 MG tablet Take 10 mg by mouth daily.   MILK THISTLE PO Take by mouth.   Vit B12-Methionine-Inos-Chol SOLN Inject into the muscle every 30 (thirty) days.   [EXPIRED] cyanocobalamin (VITAMIN B12) injection 1,000 mcg    No facility-administered encounter medications on file as of 01/16/2022.    Surgical History: Past Surgical History:  Procedure Laterality Date   BASAL CELL CARCINOMA EXCISION  02/03/2021   on nose   BREAST BIOPSY Right 2010 and 04/18/2015   BREAST BIOPSY Right 10/26/2019   x clip path pending mass w/ distortion   BREAST LUMPECTOMY WITH RADIOACTIVE SEED LOCALIZATION Right 08/20/2021   Procedure: RIGHT BREAST LUMPECTOMY WITH RADIOACTIVE SEED LOCALIZATION;   Surgeon: Jovita Kussmaul, MD;  Location: [facility];  Service: General;  Laterality: Right;   CATARACT EXTRACTION Right 04/23/2021   CATARACT EXTRACTION Left 05/04/2021   CESAREAN SECTION     corneal saltzman nodules removed Left 03/28/2021   ECTOPIC PREGNANCY SURGERY  1981   2   EYE SURGERY Right    keratectomy    ROBOTIC ASSISTED LAPAROSCOPIC OVARIAN CYSTECTOMY     SENTINEL NODE BIOPSY Right 10/02/2021   Procedure: RIGHT AXILLARY SENTINEL NODE BIOPSY;  Surgeon: Jovita Kussmaul, MD;  Location: [facility];  Service: General;  Laterality: Right;   TONSILECTOMY/ADENOIDECTOMY WITH MYRINGOTOMY  1960    Medical History: Past Medical History:  Diagnosis Date   Arthritis    back hands   Breast cancer ([place])    Breast mass, right    Cancer ([place])    Cataract    GERD (gastroesophageal reflux disease)    Hyperlipidemia    Hypertension     Family History: Family History  Problem Relation Age of Onset   Heart disease Mother    Stroke Father    Skin cancer Father    Diabetes Brother    Diabetes Brother    Cervical cancer Paternal Aunt    Breast cancer Neg Hx     Social History   Socioeconomic History   Marital status: Married    Spouse name: Not on file   Number of children: Not on file  Years of education: Not on file   Highest education level: Not on file  Occupational History   Not on file  Tobacco Use   Smoking status: Never   Smokeless tobacco: Never  Vaping Use   Vaping Use: Never used  Substance and Sexual Activity   Alcohol use: Yes    Comment: occasionally / socially   Drug use: Never   Sexual activity: Yes    Birth control/protection: Post-menopausal  Other Topics Concern   Not on file  Social History Narrative   Not on file   Social Determinants of Health   Financial Resource Strain: Not on file  Food Insecurity: Not on file  Transportation Needs: Not on file  Physical Activity: Not on file  Stress: Not on file  Social  Connections: Not on file  Intimate Partner Violence: Not on file      Review of Systems  Vital Signs: BP (!) 177/93   Pulse 64   Temp 98.2 F (36.8 C)   Resp 16   Ht 5' 2.5" (1.588 m)   Wt 156 lb 3.2 oz (70.9 kg)   SpO2 97%   BMI 28.11 kg/m    Physical Exam     Assessment/Plan:   General Counseling: Abrie verbalizes understanding of the findings of todays visit and agrees with plan of treatment. I have discussed any further diagnostic evaluation that may be needed or ordered today. We also reviewed her medications today. she has been encouraged to call the office with any questions or concerns that should arise related to todays visit.    No orders of the defined types were placed in this encounter.   Meds ordered this encounter  Medications   cyanocobalamin (VITAMIN B12) injection 1,000 mcg    No follow-ups on file.   Total time spent:*** Minutes Time spent includes review of chart, medications, test results, and follow up plan with the patient.   [name] Controlled Substance Database was reviewed by me.  This patient was seen by Jonetta Osgood, FNP-C in collaboration with Dr. Clayborn Bigness as a part of collaborative care agreement.   Kayleena Eke R. Valetta Fuller, MSN, FNP-C Internal medicine

## 2022-01-19 ENCOUNTER — Encounter: Payer: Self-pay | Admitting: Radiation Oncology

## 2022-01-19 ENCOUNTER — Ambulatory Visit: Payer: Medicare PPO

## 2022-01-19 ENCOUNTER — Ambulatory Visit
Admission: RE | Admit: 2022-01-19 | Discharge: 2022-01-19 | Disposition: A | Discharge status: Home / Self Care | Payer: Medicare PPO | Source: Ambulatory Visit | Attending: Radiation Oncology | Admitting: Radiation Oncology

## 2022-01-19 VITALS — BP 150/89 | HR 71 | Temp 97.5°F | Resp 18 | Ht 62.25 in | Wt 156.0 lb

## 2022-01-19 DIAGNOSIS — J309 Allergic rhinitis, unspecified: Secondary | ICD-10-CM | POA: Diagnosis not present

## 2022-01-19 DIAGNOSIS — C50211 Malignant neoplasm of upper-inner quadrant of right female breast: Secondary | ICD-10-CM | POA: Diagnosis not present

## 2022-01-19 NOTE — Progress Notes (Signed)
Radiation Oncology Follow up Note  Name: Kristen Reid   Date:   01/19/2022 MRN:  [id number] DOB: [birth year]    This 71 y.o. female presents to the clinic today for 1 month follow-up status post whole breast radiation to her right breast for stage Ia ER positive invasive mammary carcinoma.  REFERRING PROVIDER: Jonetta Osgood, NP  HPI: Patient is is a 71 year old female now out 1 month having completed whole breast radiation to her right breast for stage Ia invasive mammary carcinoma.  Seen today in routine follow-up she is doing well.  She specifically denies breast tenderness cough or bone pain..  She has been started on Arimidex tolerating that well without side effect.  COMPLICATIONS OF TREATMENT: none  FOLLOW UP COMPLIANCE: keeps appointments   PHYSICAL EXAM:  BP (!) 150/89 (BP Location: Left Arm, Patient Position: Sitting, Cuff Size: Normal)   Pulse 71   Temp (!) 97.5 F (36.4 C) (Tympanic)   Resp 18   Ht 5' 2.25" (1.581 m)   Wt 156 lb (70.8 kg)   BMI 28.30 kg/m  Lungs are clear to A&P cardiac examination essentially unremarkable with regular rate and rhythm. No dominant mass or nodularity is noted in either breast in 2 positions examined. Incision is well-healed. No axillary or supraclavicular adenopathy is appreciated. Cosmetic result is excellent.  Well-developed well-nourished patient in NAD. HEENT reveals PERLA, EOMI, discs not visualized.  Oral cavity is clear. No oral mucosal lesions are identified. Neck is clear without evidence of cervical or supraclavicular adenopathy. Lungs are clear to A&P. Cardiac examination is essentially unremarkable with regular rate and rhythm without murmur rub or thrill. Abdomen is benign with no organomegaly or masses noted. Motor sensory and DTR levels are equal and symmetric in the upper and lower extremities. Cranial nerves II through XII are grossly intact. Proprioception is intact. No peripheral adenopathy or edema is identified. No  motor or sensory levels are noted. Crude visual fields are within normal range.  RADIOLOGY RESULTS: No current films for review  PLAN: Present time patient is doing well 1 month out from whole breast radiation and pleased with her overall progress very low side effect profile.  I have asked to see her back in 6 months for follow-up.  Patient is to call with any concerns.  She continues on Arimidex without side effect.  I would like to take this opportunity to thank you for allowing me to participate in the care of your patient.Noreene Filbert, MD

## 2022-01-25 ENCOUNTER — Encounter: Payer: Self-pay | Admitting: Occupational Therapy

## 2022-01-27 ENCOUNTER — Encounter: Payer: Self-pay | Admitting: Nurse Practitioner

## 2022-01-27 ENCOUNTER — Ambulatory Visit: Payer: Medicare PPO | Admitting: Nurse Practitioner

## 2022-01-27 VITALS — BP 162/82 | HR 80 | Temp 97.7°F | Resp 16 | Ht 62.5 in | Wt 158.6 lb

## 2022-01-27 DIAGNOSIS — G44319 Acute post-traumatic headache, not intractable: Secondary | ICD-10-CM | POA: Diagnosis not present

## 2022-01-27 DIAGNOSIS — S0990XA Unspecified injury of head, initial encounter: Secondary | ICD-10-CM

## 2022-01-27 NOTE — Telephone Encounter (Signed)
Call and check in with her. Since we dont have openings, it would be good to go to urgent care at least

## 2022-01-27 NOTE — Progress Notes (Signed)
[facility] [place], [place] [address]  Internal MEDICINE  Office Visit Note  Patient Name: Kristen Reid  [id number]  [id number]  Date of Service: 01/27/2022  Chief Complaint  Patient presents with   Acute Visit    Head injury      HPI Kristen Reid presents for an acute sick visit for head trauma while washing windows. Possible minor concussion. Happened on Friday  Headaches since but tolerable, some sinus pressure and nausea but may have been present prior to the possible concussion.  No syncope or dizziness       Current Medication:  Outpatient Encounter Medications as of 01/27/2022  Medication Sig   anastrozole (ARIMIDEX) 1 MG tablet Take 1 tablet (1 mg total) by mouth daily.   Apple Cider Vinegar 300 MG TABS Take by mouth.   calcium carbonate (OS-CAL) 600 MG TABS tablet Take 600 mg by mouth 2 (two) times daily with a meal.   Cholecalciferol (VITAMIN D3) 2000 units TABS Take by mouth.   clobetasol (TEMOVATE) 0.05 % external solution APPLY TO AFFECTED AREA EVERY DAY AT BEDTIME   Coenzyme Q10 (COQ10) 200 MG CAPS Take by mouth.   dutasteride (AVODART) 0.5 MG capsule TAKE ONE PILL DAILY X 1 WEEK, THEN ONE PILL ONCE WEEKLY   famotidine (PEPCID) 20 MG tablet Take 1 tablet (20 mg total) by mouth daily.   fluticasone (VERAMYST) 27.5 MCG/SPRAY nasal spray Place 2 sprays into the nose daily.   Lifitegrast 5 % SOLN Apply to eye.   loratadine (CLARITIN) 10 MG tablet Take 10 mg by mouth daily.   MILK THISTLE PO Take by mouth.   Vit B12-Methionine-Inos-Chol SOLN Inject into the muscle every 30 (thirty) days.   No facility-administered encounter medications on file as of 01/27/2022.      Medical History: Past Medical History:  Diagnosis Date   Arthritis    back hands   Breast cancer ([place])    Breast mass, right    Cancer ([place])    Cataract    GERD (gastroesophageal reflux disease)    Hyperlipidemia    Hypertension      Vital Signs: BP  (!) 162/82   Pulse 80   Temp 97.7 F (36.5 C)   Resp 16   Ht 5' 2.5" (1.588 m)   Wt 158 lb 9.6 oz (71.9 kg)   SpO2 97%   BMI 28.55 kg/m    Review of Systems  Constitutional:  Negative for activity change, appetite change and fatigue.  Respiratory: Negative.  Negative for cough, chest tightness, shortness of breath and wheezing.   Cardiovascular:  Negative for chest pain and palpitations.  Neurological:  Positive for headaches. Negative for dizziness, syncope and light-headedness.    Physical Exam Vitals reviewed.  Constitutional:      General: She is not in acute distress.    Appearance: Normal appearance. She is not ill-appearing.  HENT:     Head: Normocephalic and atraumatic.  Eyes:     Pupils: Pupils are equal, round, and reactive to light.  Cardiovascular:     Rate and Rhythm: Normal rate and regular rhythm.  Pulmonary:     Effort: Pulmonary effort is normal. No respiratory distress.  Neurological:     Mental Status: She is alert and oriented to person, place, and time.  Psychiatric:        Mood and Affect: Mood normal.        Behavior: Behavior normal.       Assessment/Plan: 1.  Acute post-traumatic headache, not intractable Intermittent, not-intractable, manageable.   2. Minor traumatic injury of head with normal mental status CT head ordered to rule out possible fracture, bleed or other acute intracranial abnormalities.  - CT HEAD WO CONTRAST (5MM); Future   General Counseling: Kristen Reid understanding of the findings of todays visit and agrees with plan of treatment. I have discussed any further diagnostic evaluation that may be needed or ordered today. We also reviewed her medications today. she has been encouraged to call the office with any questions or concerns that should arise related to todays visit.    Counseling:    Orders Placed This Encounter  Procedures   CT HEAD WO CONTRAST (5MM)    No orders of the defined types were placed  in this encounter.   Return for F/U, Kristen Reid PCP for routine scheduled follow up.  [name] Controlled Substance Database was reviewed by me for overdose risk score (ORS)  Time spent:20 Minutes Time spent with patient included reviewing progress notes, labs, imaging studies, and discussing plan for follow up.   This patient was seen by Kristen Osgood, FNP-C in collaboration with Dr. Clayborn Reid as a part of collaborative care agreement.  Kristen Heringer R. Valetta Fuller, MSN, FNP-C Internal Medicine

## 2022-01-29 ENCOUNTER — Encounter: Payer: Medicare PPO | Admitting: Occupational Therapy

## 2022-01-30 ENCOUNTER — Telehealth: Payer: Medicare PPO | Admitting: Family Medicine

## 2022-01-30 DIAGNOSIS — R300 Dysuria: Secondary | ICD-10-CM

## 2022-01-30 NOTE — Progress Notes (Signed)
Because Kristen Reid, I feel your condition warrants further evaluation and I recommend that you be seen in a face to face visit. I am concerned your urine is clear, you have a fever and abd pain and no history of UTI that this needs further evaluation in the urgent care with labs testing. Thank You, Benney [place]   NOTE: There will be NO CHARGE for this eVisit   If you are having a true medical emergency please call 911.      For an urgent face to face visit, [facility] has seven urgent care centers for your convenience:     [facility] [place] [facility] [place] Get Driving Directions [phone] [address] [place], [place] [address]    [facility]) Get Driving Directions [phone] [place], [place] [address]  [facility] [place] [facility]) Get Driving Directions [phone] [address] [place],  [place]  [address]  [facility] [address] Get Driving Directions  [phone] [address] [place],  [place] [address]   [facility] Get Driving Directions [phone] [address] [place], [place], [place] [address]   [facility] Get Driving Directions  [phone] [address], [place] [address]   [facility] [place] Get Driving Directions [phone] [address], [place], [place] [address]  Your MyChart E-visit questionnaire answers were reviewed by a board certified advanced clinical practitioner to complete your personal care plan based on your specific symptoms.  Thank you for using e-Visits.

## 2022-02-03 ENCOUNTER — Ambulatory Visit
Admission: RE | Admit: 2022-02-03 | Discharge: 2022-02-03 | Disposition: A | Discharge status: Home / Self Care | Payer: Medicare PPO | Source: Ambulatory Visit | Attending: Nurse Practitioner | Admitting: Nurse Practitioner

## 2022-02-03 DIAGNOSIS — S0990XA Unspecified injury of head, initial encounter: Secondary | ICD-10-CM | POA: Insufficient documentation

## 2022-02-03 DIAGNOSIS — R519 Headache, unspecified: Secondary | ICD-10-CM | POA: Diagnosis not present

## 2022-02-05 ENCOUNTER — Ambulatory Visit: Payer: Medicare PPO | Attending: Nurse Practitioner | Admitting: Occupational Therapy

## 2022-02-05 DIAGNOSIS — L905 Scar conditions and fibrosis of skin: Secondary | ICD-10-CM | POA: Insufficient documentation

## 2022-02-05 NOTE — Therapy (Signed)
[facility] [address] [place], [address] Phone: [phone]   Fax:  [phone]  Occupational Therapy Treatment  Patient Details  Name: Kristen Reid MRN: [id number] Date of Birth: 09/26/1950 Referring Provider (OT): DR Joaquin Courts Date: 02/05/2022   OT End of Session - 02/05/22 0901     Visit Number 5    Number of Visits 10    Date for OT Re-Evaluation 04/02/22    OT Start Time 0815    OT Stop Time 0853    OT Time Calculation (min) 38 min    Activity Tolerance Patient tolerated treatment well    Behavior During Therapy [facility] for tasks assessed/performed             Past Medical History:  Diagnosis Date   Arthritis    back hands   Breast cancer ([place])    Breast mass, right    Cancer ([place])    Cataract    GERD (gastroesophageal reflux disease)    Hyperlipidemia    Hypertension     Past Surgical History:  Procedure Laterality Date   BASAL CELL CARCINOMA EXCISION  02/03/2021   on nose   BREAST BIOPSY Right 2010 and 04/18/2015   BREAST BIOPSY Right 10/26/2019   x clip path pending mass w/ distortion   BREAST LUMPECTOMY WITH RADIOACTIVE SEED LOCALIZATION Right 08/20/2021   Procedure: RIGHT BREAST LUMPECTOMY WITH RADIOACTIVE SEED LOCALIZATION;  Surgeon: Jovita Kussmaul, MD;  Location: [facility];  Service: General;  Laterality: Right;   CATARACT EXTRACTION Right 04/23/2021   CATARACT EXTRACTION Left 05/04/2021   CESAREAN SECTION     corneal saltzman nodules removed Left 03/28/2021   ECTOPIC PREGNANCY SURGERY  1981   2   EYE SURGERY Right    keratectomy    ROBOTIC ASSISTED LAPAROSCOPIC OVARIAN CYSTECTOMY     SENTINEL NODE BIOPSY Right 10/02/2021   Procedure: RIGHT AXILLARY SENTINEL NODE BIOPSY;  Surgeon: Jovita Kussmaul, MD;  Location: [facility];  Service: General;  Laterality: Right;   TONSILECTOMY/ADENOIDECTOMY WITH MYRINGOTOMY  1960    There were  no vitals filed for this visit.   Subjective Assessment - 02/05/22 0900     Subjective  I went to bed one night and then I woke up with these little lymph vessels all around my breast -cording ? - and my R breast still heavier and fuller    Pertinent History 10/28/21 seen by surgeon and refer to therapy - Pt with diagnosis right breast cancer. The patient is a 71 year old white female who is about 2 1/2 months status post right breast lumpectomy and sentinel node biopsy for a T1CN0 right breast cancer that was ER and PR positive and HER2 negative with a Ki-67 of 30%. Her margins are clean and her nodes are negative. She does report some feeling of a rope on the underside of the right breast. She also reports some bruising and some swelling in the right axilla. and d She denies any fevers or chills. There is some discoloration of the central portion of the right breast secondary to injection of the tracer. There appears to be some cording on the underside of the right breast. There also appears to be some mild swelling in the right axilla but this does not seem large enough to aspirate. There is some slight discoloration of the skin medial to this. Did prescribe antibiotic- pt started radiation 11/04/21  Patient Stated Goals I just want to scar tissue better and we can help me with the cording under my breast that he does not feel as tight.    Currently in Pain? No/denies                     Patient arrived after not being seen for about 4 weeks.  Patient report about a week to 2 weeks ago she woke up with lymphatic cording on the right breast around the nipple.  Appeared like stretch marks type of fall cording. Told patient cannot do a lot of soft tissue and myofascial on the breast. Did use mini massager as well as light massage tone with some soft tissue pain-free.  But no change Use extractor on scar adhesion and axilla as well as mini massager over lumpectomy scar with great  results. Patient can continue with Kinesiotape at scar adhesion axilla patient educated and reviewed again application. Patient do report and right breast is larger with nipple higher than the left. Clothing not fitting well. Fitted patient with complex foam to assist with lymphatic flow out of the breast to sternum and scapula on the right. Patient to follow-up with surgeon next week and ask about local vessels around breast. Patient did finish radiation in October. Would recommend for patient to get a unilateral postmastectomy Jovi pack to wear at night and as needed during the day at home to increase lymphatic flow and decrease heaviness and fullness in right breast.   Will have patient follow-up if needed in the next 2 weeks once a week.           OT Education - 02/05/22 0900     Education Details use of jovipak recommended -and komprex as well as scar massage    Person(s) Educated Patient    Methods Explanation;Demonstration;Tactile cues;Verbal cues    Comprehension Verbal cues required;Returned demonstration;Verbalized understanding                 OT Long Term Goals - 02/05/22 0902       OT LONG TERM GOAL #1   Title Patient scar adhesions improved for patient to be symptom-free with right dominant upper extremity overhead range of motion to perform overhead reaching , pulling overhead sweater    Status Achieved      OT LONG TERM GOAL #2   Title Pt to show decrease swelling in R breast with nipple same height and clothes fitting better    Baseline nipple higher -and breast larger on the R - clothes not fitting well    Time 6    Period Weeks    Status New    Target Date 03/19/22                   Plan - 02/05/22 0902     OT Occupational Profile and History Problem Focused Assessment - Including review of records relating to presenting problem    Occupational performance deficits (Please refer to evaluation for details): ADL's;IADL's;Leisure     Body Structure / Function / Physical Skills ADL;Fascial restriction;Flexibility;Scar mobility;ROM    Rehab Potential Good    Clinical Decision Making Limited treatment options, no task modification necessary    Comorbidities Affecting Occupational Performance: None    Modification or Assistance to Complete Evaluation  No modification of tasks or assist necessary to complete eval    OT Frequency 1x / week   then biweekly   OT Duration 6 weeks  OT Treatment/Interventions Self-care/ADL training;Manual Therapy;Patient/family education;Therapeutic exercise;Scar mobilization    Consulted and Agree with Plan of Care Patient             Patient will benefit from skilled therapeutic intervention in order to improve the following deficits and impairments:   Body Structure / Function / Physical Skills: ADL, Fascial restriction, Flexibility, Scar mobility, ROM       Visit Diagnosis: Scar tissue    Problem List Patient Active Problem List   Diagnosis Date Noted   Goals of care, counseling/discussion 09/22/2021   Malignant neoplasm of upper-outer quadrant of right breast in female, estrogen receptor positive ([place]) 09/12/2021   Abnormal mammogram of left breast 12/10/2020    Rosalyn Gess, OTR/L,CLT 02/05/2022, 9:04 AM  [facility] [address], [place], [address] Phone: [phone]   Fax:  [phone]  Name: Kristen Reid MRN: [id number] Date of Birth: [birth year]

## 2022-02-05 NOTE — Progress Notes (Signed)
Negative CT head, will discuss at office visit next week

## 2022-02-11 ENCOUNTER — Ambulatory Visit: Payer: Medicare PPO | Admitting: Occupational Therapy

## 2022-02-11 ENCOUNTER — Encounter: Payer: Self-pay | Admitting: Nurse Practitioner

## 2022-02-11 ENCOUNTER — Ambulatory Visit (INDEPENDENT_AMBULATORY_CARE_PROVIDER_SITE_OTHER): Payer: Medicare PPO | Admitting: Nurse Practitioner

## 2022-02-11 VITALS — BP 144/83 | HR 62 | Temp 98.3°F | Resp 16 | Ht 62.5 in | Wt 158.4 lb

## 2022-02-11 DIAGNOSIS — L905 Scar conditions and fibrosis of skin: Secondary | ICD-10-CM | POA: Diagnosis not present

## 2022-02-11 DIAGNOSIS — E538 Deficiency of other specified B group vitamins: Secondary | ICD-10-CM | POA: Diagnosis not present

## 2022-02-11 DIAGNOSIS — S0990XA Unspecified injury of head, initial encounter: Secondary | ICD-10-CM

## 2022-02-11 MED ORDER — CYANOCOBALAMIN 1000 MCG/ML IJ SOLN: 1000.0000 ug | Freq: Once | INTRAMUSCULAR | Status: AC

## 2022-02-11 NOTE — Therapy (Signed)
[facility] [address], [place], [address] Phone: [phone]   Fax:  [phone]  Occupational Therapy Treatment  Patient Details  Name: Kristen Reid MRN: [id number] Date of Birth: [birth year] Referring Provider (OT): DR Joaquin Courts Date: 02/11/2022   OT End of Session - 02/11/22 1443     Visit Number 6    Number of Visits 10    Date for OT Re-Evaluation 04/02/22    OT Start Time 2119    OT Stop Time 1515    OT Time Calculation (min) 31 min    Activity Tolerance Patient tolerated treatment well    Behavior During Therapy [facility] for tasks assessed/performed             Past Medical History:  Diagnosis Date   Arthritis    back hands   Breast cancer ([place])    Breast mass, right    Cancer ([place])    Cataract    GERD (gastroesophageal reflux disease)    Hyperlipidemia    Hypertension     Past Surgical History:  Procedure Laterality Date   BASAL CELL CARCINOMA EXCISION  02/03/2021   on nose   BREAST BIOPSY Right 2010 and 04/18/2015   BREAST BIOPSY Right 10/26/2019   x clip path pending mass w/ distortion   BREAST LUMPECTOMY WITH RADIOACTIVE SEED LOCALIZATION Right 08/20/2021   Procedure: RIGHT BREAST LUMPECTOMY WITH RADIOACTIVE SEED LOCALIZATION;  Surgeon: Jovita Kussmaul, MD;  Location: [place];  Service: General;  Laterality: Right;   CATARACT EXTRACTION Right 04/23/2021   CATARACT EXTRACTION Left 05/04/2021   CESAREAN SECTION     corneal saltzman nodules removed Left 03/28/2021   ECTOPIC PREGNANCY SURGERY  1981   2   EYE SURGERY Right    keratectomy    ROBOTIC ASSISTED LAPAROSCOPIC OVARIAN CYSTECTOMY     SENTINEL NODE BIOPSY Right 10/02/2021   Procedure: RIGHT AXILLARY SENTINEL NODE BIOPSY;  Surgeon: Jovita Kussmaul, MD;  Location: [facility];  Service: General;  Laterality: Right;   TONSILECTOMY/ADENOIDECTOMY WITH MYRINGOTOMY  1960    There  were no vitals filed for this visit.   Subjective Assessment - 02/11/22 1442     Subjective  My breast feels lighter and not as tight or hard under my arm - but still those cord looking things on my breast - seeing the surgeon next week    Pertinent History 10/28/21 seen by surgeon and refer to therapy - Pt with diagnosis right breast cancer. The patient is a 71 year old white female who is about 2 1/2 months status post right breast lumpectomy and sentinel node biopsy for a T1CN0 right breast cancer that was ER and PR positive and HER2 negative with a Ki-67 of 30%. Her margins are clean and her nodes are negative. She does report some feeling of a rope on the underside of the right breast. She also reports some bruising and some swelling in the right axilla. and d She denies any fevers or chills. There is some discoloration of the central portion of the right breast secondary to injection of the tracer. There appears to be some cording on the underside of the right breast. There also appears to be some mild swelling in the right axilla but this does not seem large enough to aspirate. There is some slight discoloration of the skin medial to this. Did prescribe antibiotic- pt started radiation 11/04/21    Patient Stated  Goals I just want to scar tissue better and we can help me with the cording under my breast that he does not feel as tight.    Currently in Pain? No/denies                    Patient arrived follow up after being seen last week for soft tissue to scar in axilla on the R and breast.  Patient report about a week to 2 weeks ago she woke up with lymphatic cording on the right breast around the nipple.  Appeared like stretch marks. Told patient cannot do a lot of soft tissue and myofascial on the breast. Pt has next week appt with surgeon Did use mini massager as well as light massage tool with some soft tissue pain-free.  But no change Use extractor on scar adhesion and axilla as  well as mini massager over lumpectomy scar with great results. And medial breast with some fibrosis. Patient can continue with Kinesiotape at scar adhesion axilla patient educated and reviewed again application last visit. Patient do report right breast is larger with nipple higher than the left. But this date after fitted with Komprex foam last week - decrease swelling and tightness - nipple same height.  Pt to cont with  complex foam to assist with lymphatic flow out of the breast to sternum and scapula on the right. Patient to follow-up with surgeon next week and ask about local vessels around breast. Patient did finish radiation in October. Would recommend for patient to get a unilateral postmastectomy Jovi pack to wear at night and as needed during the day at home to increase lymphatic flow and decrease heaviness and fullness in right breast. To get order to have if need in future.      Will have patient follow-up if needed in the next 2 weeks once a week.                  OT Education - 02/11/22 1517     Education Details use of jovipak recommended -and komprex as well as scar massage    Person(s) Educated Patient    Methods Explanation;Demonstration;Tactile cues;Verbal cues    Comprehension Verbal cues required;Returned demonstration;Verbalized understanding                 OT Long Term Goals - 02/05/22 0902       OT LONG TERM GOAL #1   Title Patient scar adhesions improved for patient to be symptom-free with right dominant upper extremity overhead range of motion to perform overhead reaching , pulling overhead sweater    Status Achieved      OT LONG TERM GOAL #2   Title Pt to show decrease swelling in R breast with nipple same height and clothes fitting better    Baseline nipple higher -and breast larger on the R - clothes not fitting well    Time 6    Period Weeks    Status New    Target Date 03/19/22                   Plan - 02/11/22 1443      Clinical Impression Statement Patient presented OT evaluation postop right lumpectomy for breast cancer on 10/02/2021.  Patient now about 4 months postop and is finished with radiation for about 2 months.   Patient was originally referred by surgeon to OT for lymphatic cording under right breast and scar tissue right axilla.  Patient done  great  with releasing  of cording under right breast.  As well as some scar tissue in axilla with adhesions.  Patient is right-hand dominant patient denies pain.  Did assess bilateral upper extremity circumference with no signs of lymphedema and riks is low.  Patient returned last week great active range of motion and strength in bilateral upper extremity  -scar in axilla improving - done some scar mobs and assage-. Pt arrive last week with marks on R breast - looking like stretch marks - pt thougth it was cording - had pt do some soft tissue mobs to R breast , wearing komprex foarm to decrease edema -and follow up with surgeon next week to assess. Pt can benefit from skilled OT services to decreasese scar tissue and tightness in R axilla and breast.    OT Occupational Profile and History Problem Focused Assessment - Including review of records relating to presenting problem    Occupational performance deficits (Please refer to evaluation for details): ADL's;IADL's;Leisure    Body Structure / Function / Physical Skills ADL;Fascial restriction;Flexibility;Scar mobility;ROM    Rehab Potential Good    Clinical Decision Making Limited treatment options, no task modification necessary    Comorbidities Affecting Occupational Performance: None    Modification or Assistance to Complete Evaluation  No modification of tasks or assist necessary to complete eval    OT Frequency 1x / week    OT Duration 6 weeks    OT Treatment/Interventions Self-care/ADL training;Manual Therapy;Patient/family education;Therapeutic exercise;Scar mobilization    Consulted and Agree with Plan of  Care Patient             Patient will benefit from skilled therapeutic intervention in order to improve the following deficits and impairments:   Body Structure / Function / Physical Skills: ADL, Fascial restriction, Flexibility, Scar mobility, ROM       Visit Diagnosis: Scar tissue    Problem List Patient Active Problem List   Diagnosis Date Noted   Goals of care, counseling/discussion 09/22/2021   Malignant neoplasm of upper-outer quadrant of right breast in female, estrogen receptor positive ([place]) 09/12/2021   Abnormal mammogram of left breast 12/10/2020    Rosalyn Gess, OTR/L,CLT 02/11/2022, 3:23 PM  [facility] [address] [place], [address] Phone: [phone]   Fax:  [phone]  Name: Kristen Reid MRN: [id number] Date of Birth: Aug 05, 1950

## 2022-02-11 NOTE — Progress Notes (Signed)
[facility] [place], [place] [address]  Internal MEDICINE  Office Visit Note  Patient Name: Kristen Reid  [id number]  [id number]  Date of Service: 02/11/2022  Chief Complaint  Patient presents with   Follow-up    Review CT    Gastroesophageal Reflux   Hypertension   Hyperlipidemia    HPI Latrisha presents for a follow up visit for B12 deficiency and CT head results.  Head injury -- CT head result was normal B12 injection due today  Experiencing side effects from anastrozole but they are tolerable.     Current Medication: Outpatient Encounter Medications as of 02/11/2022  Medication Sig   anastrozole (ARIMIDEX) 1 MG tablet Take 1 tablet (1 mg total) by mouth daily.   Apple Cider Vinegar 300 MG TABS Take by mouth.   calcium carbonate (OS-CAL) 600 MG TABS tablet Take 600 mg by mouth 2 (two) times daily with a meal.   Cholecalciferol (VITAMIN D3) 2000 units TABS Take by mouth.   clobetasol (TEMOVATE) 0.05 % external solution APPLY TO AFFECTED AREA EVERY DAY AT BEDTIME   Coenzyme Q10 (COQ10) 200 MG CAPS Take by mouth.   dutasteride (AVODART) 0.5 MG capsule TAKE ONE PILL DAILY X 1 WEEK, THEN ONE PILL ONCE WEEKLY   famotidine (PEPCID) 20 MG tablet Take 1 tablet (20 mg total) by mouth daily.   fluticasone (VERAMYST) 27.5 MCG/SPRAY nasal spray Place 2 sprays into the nose daily.   Lifitegrast 5 % SOLN Apply to eye.   loratadine (CLARITIN) 10 MG tablet Take 10 mg by mouth daily.   MILK THISTLE PO Take by mouth.   Vit B12-Methionine-Inos-Chol SOLN Inject into the muscle every 30 (thirty) days.   Facility-Administered Encounter Medications as of 02/11/2022  Medication   cyanocobalamin (VITAMIN B12) injection 1,000 mcg    Surgical History: Past Surgical History:  Procedure Laterality Date   BASAL CELL CARCINOMA EXCISION  02/03/2021   on nose   BREAST BIOPSY Right 2010 and 04/18/2015   BREAST BIOPSY Right 10/26/2019   x clip path pending  mass w/ distortion   BREAST LUMPECTOMY WITH RADIOACTIVE SEED LOCALIZATION Right 08/20/2021   Procedure: RIGHT BREAST LUMPECTOMY WITH RADIOACTIVE SEED LOCALIZATION;  Surgeon: Jovita Kussmaul, MD;  Location: [facility];  Service: General;  Laterality: Right;   CATARACT EXTRACTION Right 04/23/2021   CATARACT EXTRACTION Left 05/04/2021   CESAREAN SECTION     corneal saltzman nodules removed Left 03/28/2021   ECTOPIC PREGNANCY SURGERY  1981   2   EYE SURGERY Right    keratectomy    ROBOTIC ASSISTED LAPAROSCOPIC OVARIAN CYSTECTOMY     SENTINEL NODE BIOPSY Right 10/02/2021   Procedure: RIGHT AXILLARY SENTINEL NODE BIOPSY;  Surgeon: Jovita Kussmaul, MD;  Location: [facility];  Service: General;  Laterality: Right;   TONSILECTOMY/ADENOIDECTOMY WITH MYRINGOTOMY  1960    Medical History: Past Medical History:  Diagnosis Date   Arthritis    back hands   Breast cancer ([place])    Breast mass, right    Cancer ([place])    Cataract    GERD (gastroesophageal reflux disease)    Hyperlipidemia    Hypertension     Family History: Family History  Problem Relation Age of Onset   Heart disease Mother    Stroke Father    Skin cancer Father    Diabetes Brother    Diabetes Brother    Cervical cancer Paternal Aunt    Breast cancer Neg Hx  Social History   Socioeconomic History   Marital status: Married    Spouse name: Not on file   Number of children: Not on file   Years of education: Not on file   Highest education level: Not on file  Occupational History   Not on file  Tobacco Use   Smoking status: Never   Smokeless tobacco: Never  Vaping Use   Vaping Use: Never used  Substance and Sexual Activity   Alcohol use: Yes    Comment: occasionally / socially   Drug use: Never   Sexual activity: Yes    Birth control/protection: Post-menopausal  Other Topics Concern   Not on file  Social History Narrative   Not on file   Social Determinants of Health    Financial Resource Strain: Not on file  Food Insecurity: Not on file  Transportation Needs: Not on file  Physical Activity: Not on file  Stress: Not on file  Social Connections: Not on file  Intimate Partner Violence: Not on file      Review of Systems  Constitutional:  Negative for activity change, appetite change and fatigue.  Respiratory: Negative.  Negative for cough, chest tightness, shortness of breath and wheezing.   Cardiovascular:  Negative for chest pain and palpitations.  Neurological:  Positive for headaches. Negative for dizziness, syncope and light-headedness.    Vital Signs: BP (!) 144/83   Pulse 62   Temp 98.3 F (36.8 C)   Resp 16   Ht 5' 2.5" (1.588 m)   Wt 158 lb 6.4 oz (71.8 kg)   SpO2 97%   BMI 28.51 kg/m    Physical Exam Vitals reviewed.  Constitutional:      General: She is not in acute distress.    Appearance: Normal appearance. She is not ill-appearing.  HENT:     Head: Normocephalic and atraumatic.  Eyes:     Pupils: Pupils are equal, round, and reactive to light.  Cardiovascular:     Rate and Rhythm: Normal rate and regular rhythm.  Pulmonary:     Effort: Pulmonary effort is normal. No respiratory distress.  Neurological:     Mental Status: She is alert and oriented to person, place, and time.  Psychiatric:        Mood and Affect: Mood normal.        Behavior: Behavior normal.        Assessment/Plan: 1. B12 deficiency B12 injection administered in office today - cyanocobalamin (VITAMIN B12) injection 1,000 mcg  2. Minor traumatic injury of head with normal mental status CT head normal, headaches are improving   General Counseling: Alben Spittle understanding of the findings of todays visit and agrees with plan of treatment. I have discussed any further diagnostic evaluation that may be needed or ordered today. We also reviewed her medications today. she has been encouraged to call the office with any questions or  concerns that should arise related to todays visit.    No orders of the defined types were placed in this encounter.   Meds ordered this encounter  Medications   cyanocobalamin (VITAMIN B12) injection 1,000 mcg    Return if symptoms worsen or fail to improve, for previously scheduled in july with Samyiah Halvorsen.   Total time spent:20 Minutes Time spent includes review of chart, medications, test results, and follow up plan with the patient.   [place] Controlled Substance Database was reviewed by me.  This patient was seen by Jonetta Osgood, FNP-C in collaboration with Dr. Clayborn Bigness  as a part of collaborative care agreement.   Nyilah Kight R. Valetta Fuller, MSN, FNP-C Internal medicine

## 2022-02-16 ENCOUNTER — Ambulatory Visit: Payer: Medicare PPO

## 2022-02-17 DIAGNOSIS — Z170 Estrogen receptor positive status [ER+]: Secondary | ICD-10-CM | POA: Diagnosis not present

## 2022-02-17 DIAGNOSIS — C50411 Malignant neoplasm of upper-outer quadrant of right female breast: Secondary | ICD-10-CM | POA: Diagnosis not present

## 2022-02-19 ENCOUNTER — Ambulatory Visit: Payer: Medicare PPO | Admitting: Occupational Therapy

## 2022-02-19 DIAGNOSIS — L905 Scar conditions and fibrosis of skin: Secondary | ICD-10-CM | POA: Diagnosis not present

## 2022-02-19 NOTE — Therapy (Signed)
[facility] [address], [place], [address] Phone: [phone]   Fax:  [phone]  Occupational Therapy Treatment  Patient Details  Name: Kristen Reid MRN: [id number] Date of Birth: Mar 01, 1951 Referring Provider (OT): DR Joaquin Courts Date: 02/19/2022   OT End of Session - 02/19/22 0853     Visit Number 7    Number of Visits 10    Date for OT Re-Evaluation 04/02/22    OT Start Time 0819    OT Stop Time 0850    OT Time Calculation (min) 31 min    Activity Tolerance Patient tolerated treatment well    Behavior During Therapy [facility] for tasks assessed/performed             Past Medical History:  Diagnosis Date   Arthritis    back hands   Breast cancer ([place])    Breast mass, right    Cancer ([place])    Cataract    GERD (gastroesophageal reflux disease)    Hyperlipidemia    Hypertension     Past Surgical History:  Procedure Laterality Date   BASAL CELL CARCINOMA EXCISION  02/03/2021   on nose   BREAST BIOPSY Right 2010 and 04/18/2015   BREAST BIOPSY Right 10/26/2019   x clip path pending mass w/ distortion   BREAST LUMPECTOMY WITH RADIOACTIVE SEED LOCALIZATION Right 08/20/2021   Procedure: RIGHT BREAST LUMPECTOMY WITH RADIOACTIVE SEED LOCALIZATION;  Surgeon: Jovita Kussmaul, MD;  Location: [facility];  Service: General;  Laterality: Right;   CATARACT EXTRACTION Right 04/23/2021   CATARACT EXTRACTION Left 05/04/2021   CESAREAN SECTION     corneal saltzman nodules removed Left 03/28/2021   ECTOPIC PREGNANCY SURGERY  1981   2   EYE SURGERY Right    keratectomy    ROBOTIC ASSISTED LAPAROSCOPIC OVARIAN CYSTECTOMY     SENTINEL NODE BIOPSY Right 10/02/2021   Procedure: RIGHT AXILLARY SENTINEL NODE BIOPSY;  Surgeon: Jovita Kussmaul, MD;  Location: [facility];  Service: General;  Laterality: Right;   TONSILECTOMY/ADENOIDECTOMY WITH MYRINGOTOMY  1960    There  were no vitals filed for this visit.   Subjective Assessment - 02/19/22 0852     Subjective  Seen surgeon since last time- follow up in 6 months- told me it can take year to 2 years to get better- was happy with healing post surgery    Pertinent History 10/28/21 seen by surgeon and refer to therapy - Pt with diagnosis right breast cancer. The patient is a 71 year old white female who is about 2 1/2 months status post right breast lumpectomy and sentinel node biopsy for a T1CN0 right breast cancer that was ER and PR positive and HER2 negative with a Ki-67 of 30%. Her margins are clean and her nodes are negative. She does report some feeling of a rope on the underside of the right breast. She also reports some bruising and some swelling in the right axilla. and d She denies any fevers or chills. There is some discoloration of the central portion of the right breast secondary to injection of the tracer. There appears to be some cording on the underside of the right breast. There also appears to be some mild swelling in the right axilla but this does not seem large enough to aspirate. There is some slight discoloration of the skin medial to this. Did prescribe antibiotic- pt started radiation 11/04/21    Patient Stated Goals  I just want to scar tissue better and we can help me with the cording under my breast that he does not feel as tight.    Currently in Pain? No/denies                    Pt return to OT about 2-3 wks ago with what she thought "  woke up with lymphatic cording on the right breast"  Appeared like stretch marks. Did use mini massager as well as light massage tool with some soft tissue pain-free.  But no change in marking but did decrease fibrosis and fullness in R breast. Use extractor on scar adhesion and axilla as well as mini massager over lumpectomy scar with great results. And medial breast with some fibrosis. Patient do report right breast is larger with nipple higher than  the left but great improvement since 2 sessions ago with pt doing soft tissue mobs, MLD across breast from R axilla to L breast 20 reps. Pt to cont wearing  Komprex foam  under bra or camisole- decrease swelling and tightness - nipple closer to same height than L.   Pt to cont with  comprex foam to assist with lymphatic flow out of the breast to sternum and scapula on the right. Patient did finish radiation in October. Would recommend for patient to get a unilateral postmastectomy Jovi pack to wear at night and as needed during the day at home to increase lymphatic flow and decrease heaviness and fullness in right breast. IF komprex not working. Did get order from surgeon     Will have patient follow-up if needed in month                OT Education - 02/19/22 0853     Education Details use of jovipak recommended -or komprex for edema as well as scar massage    Person(s) Educated Patient    Methods Explanation;Demonstration;Tactile cues;Verbal cues    Comprehension Verbal cues required;Returned demonstration;Verbalized understanding                 OT Long Term Goals - 02/05/22 0902       OT LONG TERM GOAL #1   Title Patient scar adhesions improved for patient to be symptom-free with right dominant upper extremity overhead range of motion to perform overhead reaching , pulling overhead sweater    Status Achieved      OT LONG TERM GOAL #2   Title Pt to show decrease swelling in R breast with nipple same height and clothes fitting better    Baseline nipple higher -and breast larger on the R - clothes not fitting well    Time 6    Period Weeks    Status New    Target Date 03/19/22                   Plan - 02/19/22 0853     Clinical Impression Statement Patient presented OT evaluation postop right lumpectomy for breast cancer on 10/02/2021.  Patient now about 4  1/2 months postop and is finished with radiation.   Patient was originally referred by surgeon  to OT for lymphatic cording under right breast and scar tissue right axilla.  Patient done great  with releasing  of cording under right breast.  As well as some scar tissue in axilla with adhesions.  Patient is right-hand dominant patient denies pain.  Did assess bilateral upper extremity circumference with no signs of lymphedema and  riks is low.  Patient returned 2 wks ago with great active range of motion and strength in bilateral upper extremity  -scar in axilla improving - done some scar mobs and massage with great success. Do have some marks on R breast that appear like stretch marks-  pt thougth it was cording - had pt do some soft tissue mobs to R breast , wearing komprex foarm to decrease edema - nipple height now closer to even and less fullness in R breast . Pt to cont for another month with soft tissue mobs, MLD and komprex foam. Can follow up with me in month if needed.    OT Occupational Profile and History Problem Focused Assessment - Including review of records relating to presenting problem    Occupational performance deficits (Please refer to evaluation for details): ADL's;IADL's;Leisure    Body Structure / Function / Physical Skills ADL;Fascial restriction;Flexibility;Scar mobility;ROM    Rehab Potential Good    Clinical Decision Making Limited treatment options, no task modification necessary    Comorbidities Affecting Occupational Performance: None    Modification or Assistance to Complete Evaluation  No modification of tasks or assist necessary to complete eval    OT Frequency Monthly    OT Duration 6 weeks    OT Treatment/Interventions Self-care/ADL training;Manual Therapy;Patient/family education;Therapeutic exercise;Scar mobilization    Consulted and Agree with Plan of Care Patient             Patient will benefit from skilled therapeutic intervention in order to improve the following deficits and impairments:   Body Structure / Function / Physical Skills: ADL, Fascial  restriction, Flexibility, Scar mobility, ROM       Visit Diagnosis: Scar tissue    Problem List Patient Active Problem List   Diagnosis Date Noted   Goals of care, counseling/discussion 09/22/2021   Malignant neoplasm of upper-outer quadrant of right breast in female, estrogen receptor positive ([place]) 09/12/2021   Abnormal mammogram of left breast 12/10/2020    Rosalyn Gess, OTR/L,CLT 02/19/2022, 8:57 AM  [facility] [address] [place], [address] Phone: [phone]   Fax:  [phone]  Name: Kristen Reid MRN: [id number] Date of Birth: May 06, 1950

## 2022-03-13 DIAGNOSIS — H26493 Other secondary cataract, bilateral: Secondary | ICD-10-CM | POA: Diagnosis not present

## 2022-03-14 ENCOUNTER — Other Ambulatory Visit: Payer: Self-pay | Admitting: Oncology

## 2022-03-16 ENCOUNTER — Ambulatory Visit (INDEPENDENT_AMBULATORY_CARE_PROVIDER_SITE_OTHER): Payer: Medicare PPO

## 2022-03-16 DIAGNOSIS — E538 Deficiency of other specified B group vitamins: Secondary | ICD-10-CM

## 2022-03-16 MED ORDER — CYANOCOBALAMIN 1000 MCG/ML IJ SOLN
1000.0000 ug | Freq: Once | INTRAMUSCULAR | Status: AC
  Administered 2022-03-16: 1000 ug via INTRAMUSCULAR

## 2022-03-23 ENCOUNTER — Ambulatory Visit: Payer: Medicare PPO | Admitting: Occupational Therapy

## 2022-03-24 ENCOUNTER — Encounter: Payer: Self-pay | Admitting: Oncology

## 2022-03-24 ENCOUNTER — Inpatient Hospital Stay (HOSPITAL_BASED_OUTPATIENT_CLINIC_OR_DEPARTMENT_OTHER): Payer: Medicare PPO | Admitting: Oncology

## 2022-03-24 ENCOUNTER — Inpatient Hospital Stay: Payer: Medicare PPO | Attending: Oncology

## 2022-03-24 VITALS — BP 149/69 | HR 72 | Temp 98.0°F | Wt 158.4 lb

## 2022-03-24 DIAGNOSIS — M8580 Other specified disorders of bone density and structure, unspecified site: Secondary | ICD-10-CM | POA: Insufficient documentation

## 2022-03-24 DIAGNOSIS — Z79811 Long term (current) use of aromatase inhibitors: Secondary | ICD-10-CM | POA: Insufficient documentation

## 2022-03-24 DIAGNOSIS — Z170 Estrogen receptor positive status [ER+]: Secondary | ICD-10-CM

## 2022-03-24 DIAGNOSIS — C50411 Malignant neoplasm of upper-outer quadrant of right female breast: Secondary | ICD-10-CM | POA: Diagnosis not present

## 2022-03-24 LAB — COMPREHENSIVE METABOLIC PANEL
ALT: 33 U/L (ref 0–44)
AST: 34 U/L (ref 15–41)
Albumin: 4.1 g/dL (ref 3.5–5.0)
Alkaline Phosphatase: 60 U/L (ref 38–126)
Anion gap: 11 (ref 5–15)
BUN: 15 mg/dL (ref 8–23)
CO2: 30 mmol/L (ref 22–32)
Calcium: 9.2 mg/dL (ref 8.9–10.3)
Chloride: 98 mmol/L (ref 98–111)
Creatinine, Ser: 0.65 mg/dL (ref 0.44–1.00)
GFR, Estimated: >60 mL/min (ref >60)
Glucose, Bld: 104 mg/dL — ABNORMAL HIGH (ref 70–99)
Potassium: 3.8 mmol/L (ref 3.5–5.1)
Sodium: 139 mmol/L (ref 135–145)
Total Bilirubin: 0.8 mg/dL (ref 0.3–1.2)
Total Protein: 7.1 g/dL (ref 6.5–8.1)

## 2022-03-24 LAB — CBC WITH DIFFERENTIAL/PLATELET
Abs Immature Granulocytes: 0.01 10*3/uL (ref 0.00–0.07)
Basophils Absolute: 0 10*3/uL (ref 0.0–0.1)
Basophils Relative: 1 %
Eosinophils Absolute: 0.1 10*3/uL (ref 0.0–0.5)
Eosinophils Relative: 2 %
HCT: 39.1 % (ref 36.0–46.0)
Hemoglobin: 14 g/dL (ref 12.0–15.0)
Immature Granulocytes: 0 %
Lymphocytes Relative: 39 %
Lymphs Abs: 1.5 10*3/uL (ref 0.7–4.0)
MCH: 32.9 pg (ref 26.0–34.0)
MCHC: 35.8 g/dL (ref 30.0–36.0)
MCV: 92 fL (ref 80.0–100.0)
Monocytes Absolute: 0.3 10*3/uL (ref 0.1–1.0)
Monocytes Relative: 8 %
Neutro Abs: 1.9 10*3/uL (ref 1.7–7.7)
Neutrophils Relative %: 50 %
Platelets: 204 10*3/uL (ref 150–400)
RBC: 4.25 MIL/uL (ref 3.87–5.11)
RDW: 12.2 % (ref 11.5–15.5)
WBC: 3.9 10*3/uL — ABNORMAL LOW (ref 4.0–10.5)
nRBC: 0 % (ref 0.0–0.2)

## 2022-03-24 MED ORDER — ANASTROZOLE 1 MG PO TABS: 1.0000 mg | ORAL_TABLET | Freq: Every day | ORAL | 1 refills | Status: DC

## 2022-03-24 NOTE — Assessment & Plan Note (Signed)
Continue calcium and vitamin D supplementation.  Repeat DEXA in August 2024

## 2022-03-24 NOTE — Progress Notes (Signed)
Hematology/Oncology Progress note [phone] [phone]            Patient Care Team: Jonetta Osgood, NP as PCP - General (Nurse Practitioner) Daiva Huge, RN as Oncology Nurse Navigator  ASSESSMENT & PLAN:   Cancer Staging  Malignant neoplasm of upper-outer quadrant of right breast in female, estrogen receptor positive ([place]) Staging form: Breast, AJCC 8th Edition - Pathologic stage from 10/02/2021: Stage IA (pT1c, pN0, cM0, G2, ER+, PR+, HER2-, Oncotype DX score: 18) - Signed by Earlie Server, MD on 12/22/2021   Malignant neoplasm of upper-outer quadrant of right breast in female, estrogen receptor positive ([place]) Right upper breast invasive carcinoma plus DCIS, ER/PR positive, HER2 negative.  Ki-67 20%. Axillary LN negative.  pT1c pN0 Oncotype DX 18, no adjuvant chemotherapy.  S/p Adjuvant Radiation.  Continue -Arimidex '1mg'$  daily, Refills sent to pharmacy.  Obtain bilateral diagnostic mammogram  Osteopenia Continue calcium and vitamin D supplementation.  Repeat DEXA in August 2024  Orders Placed This Encounter  Procedures   MM DIAG BREAST TOMO BILATERAL    Standing Status:   Future    Standing Expiration Date:   03/25/2023    Order Specific Question:   Reason for Exam (SYMPTOM  OR DIAGNOSIS REQUIRED)    Answer:   hx breast cancer    Order Specific Question:   Preferred imaging location?    Answer:   [facility]   US BREAST LTD UNI LEFT INC AXILLA    Standing Status:   Future    Standing Expiration Date:   03/25/2023    Order Specific Question:   Reason for Exam (SYMPTOM  OR DIAGNOSIS REQUIRED)    Answer:   hx breast cancer    Order Specific Question:   Preferred imaging location?    Answer:   [facility]   US BREAST LTD UNI RIGHT INC AXILLA    Standing Status:   Future    Standing Expiration Date:   03/25/2023    Order Specific Question:   Reason for Exam (SYMPTOM  OR DIAGNOSIS REQUIRED)    Answer:   hx breast cancer    Order Specific  Question:   Preferred imaging location?    Answer:   [facility]   CBC with Differential/Platelet    Standing Status:   Future    Standing Expiration Date:   03/25/2023   Comprehensive metabolic panel    Standing Status:   Future    Standing Expiration Date:   03/24/2023   Follow up in 6 months All questions were answered. The patient knows to call the clinic with any problems, questions or concerns. Earlie Server, MD, PhD [facility] Hematology Oncology 03/24/2022    CHIEF COMPLAINTS/REASON FOR VISIT:  Follow up for Stage I breast cancer  HISTORY OF PRESENTING ILLNESS:   Kristen Reid is a  72 y.o.  female presents for follow up of  right breast cancer Oncology history listed as below Oncology History  Malignant neoplasm of upper-outer quadrant of right breast in female, estrogen receptor positive ([place])  10/10/2019 Mammogram   Unilateral breast mammogram showed partially obscured mass with associated architectural distortion within the upper slightly inner right breast.  Without definitive sonographic correlates.  Stereotactic biopsy was recommended.   10/03/2020 Imaging   1. Stable appearance of RIGHT breast architectural distortion which demonstrated fibrosis and fibrocystic change. X clip is in appropriate position. Recommend surgical consultation for consideration of excision versus close monitoring. 2. No mammographic evidence of malignancy in the  LEFT breast   05/02/2021 Mammogram   Unilateral diagnostic mammogram right 1. Possible slight increase in size of the distorted mass at the site of the X shaped biopsy marking clip in the upper-inner right breast. 2.  No evidence of right axillary lymphadenopathy.   09/12/2021 Initial Diagnosis   Malignant neoplasm of upper-outer quadrant of right breast in female, estrogen receptor positive ([place])  -10/06/2019 right breast superior stereotactic core needle biopsy showed benign mammary parenchyma with stromal fibrosis, fibrocystic  changes, and focal usual ductal hyperplasia.  Negative for atypical proliferative breast disease. -08/20/2021, right breast lumpectomy showed invasive moderately differentiated ductal adenocarcinoma, grade 2, associated with DCIS in situ, intermediate nuclear grade, solid type with focal necrosis.  Margins free (invasive tumor 1 mm from superior margin; DCIS 1 mm from superior margin) pT1c pNx    10/02/2021 Cancer Staging   Staging form: Breast, AJCC 8th Edition - Pathologic stage from 10/02/2021: Stage IA (pT1c, pN0, cM0, G2, ER+, PR+, HER2-, Oncotype DX score: 18) - Signed by Earlie Server, MD on 12/22/2021 Stage prefix: Initial diagnosis Multigene prognostic tests performed: Oncotype DX Recurrence score range: Greater than or equal to 11 Histologic grading system: 3 grade system   10/02/2021 Surgery   Patient underwent sentinel lymph node biopsy.  All 4 lymph nodes are negative.   10/06/2021 Oncotype testing   Oncotype DX recurrence score 18, distant recurrence risk at 9 years 5%, group average absolute chemotherapy benefit less than 1%.   11/27/2021 - 12/08/2021 Radiation Therapy   Adjuvant right breast radiation.     Menarche 72 years of age Menopause in 66s. Very short period of time of hormone replacement therapy, less than 1 year Previous OCP for couple of years. Denies any family history of breast cancer.  Great aunt was diagnosed with uterine cancer.  No other cancer family history.  INTERVAL HISTORY Kristen Reid is a 72 y.o. female who has above history reviewed by me today presents for follow up visit for Stage IA right breast cancer. She tolerates Arimidex with manageable side effects, joint pain.    Review of Systems  Constitutional:  Negative for appetite change, chills, fatigue and fever.  HENT:   Negative for hearing loss and voice change.   Eyes:  Negative for eye problems.  Respiratory:  Negative for chest tightness and cough.   Cardiovascular:  Negative for chest  pain.  Gastrointestinal:  Negative for abdominal distention, abdominal pain and blood in stool.  Endocrine: Negative for hot flashes.  Genitourinary:  Negative for difficulty urinating and frequency.   Musculoskeletal:  Negative for arthralgias.  Skin:  Negative for itching and rash.  Neurological:  Negative for extremity weakness.  Hematological:  Negative for adenopathy.  Psychiatric/Behavioral:  Negative for confusion.     MEDICAL HISTORY:  Past Medical History:  Diagnosis Date   Arthritis    back hands   Breast cancer ([place])    Breast mass, right    Cancer ([place])    Cataract    GERD (gastroesophageal reflux disease)    Hyperlipidemia    Hypertension     SURGICAL HISTORY: Past Surgical History:  Procedure Laterality Date   BASAL CELL CARCINOMA EXCISION  02/03/2021   on nose   BREAST BIOPSY Right 2010 and 04/18/2015   BREAST BIOPSY Right 10/26/2019   x clip path pending mass w/ distortion   BREAST LUMPECTOMY WITH RADIOACTIVE SEED LOCALIZATION Right 08/20/2021   Procedure: RIGHT BREAST LUMPECTOMY WITH RADIOACTIVE SEED LOCALIZATION;  Surgeon: [name],  MD;  Location: [facility];  Service: General;  Laterality: Right;   CATARACT EXTRACTION Right 04/23/2021   CATARACT EXTRACTION Left 05/04/2021   CESAREAN SECTION     corneal saltzman nodules removed Left 03/28/2021   ECTOPIC PREGNANCY SURGERY  1981   2   EYE SURGERY Right    keratectomy    ROBOTIC ASSISTED LAPAROSCOPIC OVARIAN CYSTECTOMY     SENTINEL NODE BIOPSY Right 10/02/2021   Procedure: RIGHT AXILLARY SENTINEL NODE BIOPSY;  Surgeon: Jovita Kussmaul, MD;  Location: [facility];  Service: General;  Laterality: Right;   TONSILECTOMY/ADENOIDECTOMY WITH MYRINGOTOMY  1960    SOCIAL HISTORY: Social History   Socioeconomic History   Marital status: Married    Spouse name: Not on file   Number of children: Not on file   Years of education: Not on file   Highest education level: Not on  file  Occupational History   Not on file  Tobacco Use   Smoking status: Never   Smokeless tobacco: Never  Vaping Use   Vaping Use: Never used  Substance and Sexual Activity   Alcohol use: Yes    Comment: occasionally / socially   Drug use: Never   Sexual activity: Yes    Birth control/protection: Post-menopausal  Other Topics Concern   Not on file  Social History Narrative   Not on file   Social Determinants of Health   Financial Resource Strain: Not on file  Food Insecurity: Not on file  Transportation Needs: Not on file  Physical Activity: Not on file  Stress: Not on file  Social Connections: Not on file  Intimate Partner Violence: Not on file    FAMILY HISTORY: Family History  Problem Relation Age of Onset   Heart disease Mother    Stroke Father    Skin cancer Father    Diabetes Brother    Diabetes Brother    Cervical cancer Paternal Aunt    Breast cancer Neg Hx     ALLERGIES:  has No Known Allergies.  MEDICATIONS:  Current Outpatient Medications  Medication Sig Dispense Refill   Apple Cider Vinegar 300 MG TABS Take by mouth.     calcium carbonate (OS-CAL) 600 MG TABS tablet Take 600 mg by mouth 2 (two) times daily with a meal.     Cholecalciferol (VITAMIN D3) 2000 units TABS Take by mouth.     clobetasol (TEMOVATE) 0.05 % external solution APPLY TO AFFECTED AREA EVERY DAY AT BEDTIME     Coenzyme Q10 (COQ10) 200 MG CAPS Take by mouth.     dutasteride (AVODART) 0.5 MG capsule TAKE ONE PILL DAILY X 1 WEEK, THEN ONE PILL ONCE WEEKLY     famotidine (PEPCID) 20 MG tablet Take 1 tablet (20 mg total) by mouth daily. 90 tablet 3   fluticasone (VERAMYST) 27.5 MCG/SPRAY nasal spray Place 2 sprays into the nose daily.     Lifitegrast 5 % SOLN Apply to eye.     loratadine (CLARITIN) 10 MG tablet Take 10 mg by mouth daily.     MILK THISTLE PO Take by mouth.     Vit B12-Methionine-Inos-Chol SOLN Inject into the muscle every 30 (thirty) days.     anastrozole (ARIMIDEX)  1 MG tablet Take 1 tablet (1 mg total) by mouth daily. 90 tablet 1   Current Facility-Administered Medications  Medication Dose Route Frequency Provider Last Rate Last Admin   cyanocobalamin (VITAMIN B12) injection 1,000 mcg  1,000 mcg Intramuscular Once Abernathy, Alyssa,  NP         PHYSICAL EXAMINATION: ECOG PERFORMANCE STATUS: 1 - Symptomatic but completely ambulatory Vitals:   03/24/22 1358  BP: (!) 149/69  Pulse: 72  Temp: 98 F (36.7 C)  SpO2: 98%   Filed Weights   03/24/22 1358  Weight: 158 lb 6.4 oz (71.8 kg)    Physical Exam Constitutional:      General: She is not in acute distress. HENT:     Head: Normocephalic and atraumatic.  Eyes:     General: No scleral icterus. Cardiovascular:     Rate and Rhythm: Normal rate and regular rhythm.     Heart sounds: Normal heart sounds.  Pulmonary:     Effort: Pulmonary effort is normal. No respiratory distress.     Breath sounds: No wheezing.  Abdominal:     General: Bowel sounds are normal. There is no distension.     Palpations: Abdomen is soft.  Musculoskeletal:        General: No deformity. Normal range of motion.     Cervical back: Normal range of motion and neck supple.  Skin:    General: Skin is warm and dry.     Findings: No erythema or rash.  Neurological:     Mental Status: She is alert and oriented to person, place, and time. Mental status is at baseline.     Cranial Nerves: No cranial nerve deficit.     Coordination: Coordination normal.  Psychiatric:        Mood and Affect: Mood normal.     LABORATORY DATA:  I have reviewed the data as listed    Latest Ref Rng & Units 03/24/2022    1:46 PM 11/26/2021    1:05 PM 11/12/2021    1:18 PM  CBC  WBC 4.0 - 10.5 K/uL 3.9  3.8  4.0   Hemoglobin 12.0 - 15.0 g/dL 14.0  14.9  14.8   Hematocrit 36.0 - 46.0 % 39.1  42.7  43.4   Platelets 150 - 400 K/uL 204  215  220       Latest Ref Rng & Units 03/24/2022    1:46 PM 01/09/2022    7:34 AM 10/09/2021    8:08  AM  CMP  Glucose 70 - 99 mg/dL 104   102   BUN 8 - 23 mg/dL 15   11   Creatinine 0.44 - 1.00 mg/dL 0.65   0.65   Sodium 135 - 145 mmol/L 139   142   Potassium 3.5 - 5.1 mmol/L 3.8   4.3   Chloride 98 - 111 mmol/L 98   103   CO2 22 - 32 mmol/L 30   26   Calcium 8.9 - 10.3 mg/dL 9.2   9.8   Total Protein 6.5 - 8.1 g/dL 7.1  6.5  6.4   Total Bilirubin 0.3 - 1.2 mg/dL 0.8  0.8  1.3   Alkaline Phos 38 - 126 U/L 60  63  68   AST 15 - 41 U/L 34  22  23   ALT 0 - 44 U/L 33  24  16      RADIOGRAPHIC STUDIES: I have personally reviewed the radiological images as listed and agreed with the findings in the report. CT HEAD WO CONTRAST (5MM)  Result Date: 02/03/2022 CLINICAL DATA:  Pt states she was washing windows 1 week ago and the window fell out and landed on top of her head. She states she has had intermittent pain to  the top of her head. Hx Right Breast CA with Lumpectomy and Rad tx's in 11/2021. EXAM: CT HEAD WITHOUT CONTRAST TECHNIQUE: Contiguous axial images were obtained from the base of the skull through the vertex without intravenous contrast. RADIATION DOSE REDUCTION: This exam was performed according to the departmental dose-optimization program which includes automated exposure control, adjustment of the mA and/or kV according to patient size and/or use of iterative reconstruction technique. COMPARISON:  None Available. FINDINGS: Brain: No evidence of acute infarction, hemorrhage, hydrocephalus, extra-axial collection or mass lesion/mass effect. Vascular: No hyperdense vessel or unexpected calcification. Skull: Normal. Negative for fracture or focal lesion. Sinuses/Orbits: No acute finding. Other: None. IMPRESSION: No acute intracranial pathology. Electronically Signed   By: Nolon Nations M.D.   On: 02/03/2022 16:18

## 2022-03-24 NOTE — Assessment & Plan Note (Addendum)
Right upper breast invasive carcinoma plus DCIS, ER/PR positive, HER2 negative.  Ki-67 20%. Axillary LN negative.  pT1c pN0 Oncotype DX 18, no adjuvant chemotherapy.  S/p Adjuvant Radiation.  Continue -Arimidex '1mg'$  daily, Refills sent to pharmacy.  Obtain bilateral diagnostic mammogram

## 2022-03-25 ENCOUNTER — Ambulatory Visit: Payer: Medicare PPO | Admitting: Oncology

## 2022-03-25 ENCOUNTER — Other Ambulatory Visit: Payer: Medicare PPO

## 2022-03-30 ENCOUNTER — Encounter: Payer: Self-pay | Admitting: Occupational Therapy

## 2022-03-30 ENCOUNTER — Ambulatory Visit: Payer: Medicare PPO | Attending: General Surgery | Admitting: Occupational Therapy

## 2022-03-30 DIAGNOSIS — L905 Scar conditions and fibrosis of skin: Secondary | ICD-10-CM | POA: Diagnosis not present

## 2022-03-30 DIAGNOSIS — I890 Lymphedema, not elsewhere classified: Secondary | ICD-10-CM | POA: Diagnosis not present

## 2022-03-30 NOTE — Therapy (Signed)
[facility] [address] [place], [address] Phone: [phone]   Fax:  [phone]  Occupational Therapy Treatment  Patient Details  Name: Kristen Reid MRN: [id number] Date of Birth: 09/22/1950 Referring Provider (OT): DR Joaquin Courts Date: 03/30/2022   OT End of Session - 03/30/22 1645     Visit Number 8    Number of Visits 10    Date for OT Re-Evaluation 04/02/22    OT Start Time 1600    OT Stop Time 1630    OT Time Calculation (min) 30 min    Activity Tolerance Patient tolerated treatment well    Behavior During Therapy [facility] for tasks assessed/performed             Past Medical History:  Diagnosis Date   Arthritis    back hands   Breast cancer ([place])    Breast mass, right    Cancer ([place])    Cataract    GERD (gastroesophageal reflux disease)    Hyperlipidemia    Hypertension     Past Surgical History:  Procedure Laterality Date   BASAL CELL CARCINOMA EXCISION  02/03/2021   on nose   BREAST BIOPSY Right 2010 and 04/18/2015   BREAST BIOPSY Right 10/26/2019   x clip path pending mass w/ distortion   BREAST LUMPECTOMY WITH RADIOACTIVE SEED LOCALIZATION Right 08/20/2021   Procedure: RIGHT BREAST LUMPECTOMY WITH RADIOACTIVE SEED LOCALIZATION;  Surgeon: Jovita Kussmaul, MD;  Location: [facility];  Service: General;  Laterality: Right;   CATARACT EXTRACTION Right 04/23/2021   CATARACT EXTRACTION Left 05/04/2021   CESAREAN SECTION     corneal saltzman nodules removed Left 03/28/2021   ECTOPIC PREGNANCY SURGERY  1981   2   EYE SURGERY Right    keratectomy    ROBOTIC ASSISTED LAPAROSCOPIC OVARIAN CYSTECTOMY     SENTINEL NODE BIOPSY Right 10/02/2021   Procedure: RIGHT AXILLARY SENTINEL NODE BIOPSY;  Surgeon: Jovita Kussmaul, MD;  Location: [facility];  Service: General;  Laterality: Right;   TONSILECTOMY/ADENOIDECTOMY WITH MYRINGOTOMY  1960    There were no vitals  filed for this visit.   Subjective Assessment - 03/30/22 1644     Subjective  I am doing okay - my shoulder just stiff - have to move it around -tight under armpit and into breast- using the komprex foam some evenings  my breast just feels more swollen or full    Pertinent History 10/28/21 seen by surgeon and refer to therapy - Pt with diagnosis right breast cancer. The patient is a 72 year old white female who is about 2 1/2 months status post right breast lumpectomy and sentinel node biopsy for a T1CN0 right breast cancer that was ER and PR positive and HER2 negative with a Ki-67 of 30%. Her margins are clean and her nodes are negative. She does report some feeling of a rope on the underside of the right breast. She also reports some bruising and some swelling in the right axilla. and d She denies any fevers or chills. There is some discoloration of the central portion of the right breast secondary to injection of the tracer. There appears to be some cording on the underside of the right breast. There also appears to be some mild swelling in the right axilla but this does not seem large enough to aspirate. There is some slight discoloration of the skin medial to this. Did prescribe antibiotic- pt started radiation  11/04/21    Patient Stated Goals I just want to scar tissue better and we can help me with the cording under my breast that he does not feel as tight.    Currently in Pain? No/denies                 LYMPHEDEMA/ONCOLOGY QUESTIONNAIRE - 03/30/22 0001       Right Upper Extremity Lymphedema   15 cm Proximal to Olecranon Process 28 cm    10 cm Proximal to Olecranon Process 26 cm    Olecranon Process 22.6 cm      Left Upper Extremity Lymphedema   15 cm Proximal to Olecranon Process 28.5 cm    10 cm Proximal to Olecranon Process 25.5 cm    Olecranon Process 22.5 cm            Pt return to OT after seen 5 wks ago  Pt report and show increase tightness and stiffness still in R  shoulder and axilla  Review with pt AAROM for shoulder on wall for flexion and ABD  And then isometric into wall with scapula retraction -  10 reps  In shower - for better compliance  Pt can follow up with me in 6-8 wks if needed   Circumference in bilateral UE - WNL - no signs or symptoms in bilateral UE  Patient do report right breast is larger with nipple higher than the left but great improvement since 3 sessions ago with pt doing soft tissue mobs, MLD across breast from R axilla to L breast 20 reps. Pt  wore Komprex foam  under bra or camisole- to decrease swelling and tightness - nipple closer to same height than L. But at this time would recommend for pt to look into getting jovipak unilateral postmastectomy pad to use night time  Pt report some evenings she feels breast being more heavy or full.    Patient did finish radiation in October.                   OT Education - 03/30/22 1645     Education Details use of jovipak recommended    Person(s) Educated Patient    Methods Explanation;Demonstration;Tactile cues;Verbal cues    Comprehension Verbal cues required;Returned demonstration;Verbalized understanding                 OT Long Term Goals - 02/05/22 0902       OT LONG TERM GOAL #1   Title Patient scar adhesions improved for patient to be symptom-free with right dominant upper extremity overhead range of motion to perform overhead reaching , pulling overhead sweater    Status Achieved      OT LONG TERM GOAL #2   Title Pt to show decrease swelling in R breast with nipple same height and clothes fitting better    Baseline nipple higher -and breast larger on the R - clothes not fitting well    Time 6    Period Weeks    Status New    Target Date 03/19/22                   Plan - 03/30/22 1646     Clinical Impression Statement Patient presented OT evaluation postop right lumpectomy for breast cancer on 10/02/2021.  Patient now about close  to 6 months postop and is finished with radiation Oct 23.   Patient was originally referred by surgeon to OT for lymphatic cording under right breast and  scar tissue right axilla.  Patient done great  with releasing  of cording under right breast.  As well as some scar tissue in axilla with adhesions.  Patient is right-hand dominant and denies pain.  Did assess bilateral upper extremity circumference with no signs of lymphedema and riks is low.  Pt cont to have stiffness and tightness in R axilla with ROM - review with pt some ROM and exercises to do at home to prevent shoulder issues. Pt do have some increase heaviness and fullness in breast since last year - per pt some evenings worse than other times. Komprex foam did help some but recommend still for pt to get unilateral post mastectomy breast pad to use night time or as needed to decrease edema /lymphedema in R breast. Will send to [facility] Dr Marlou Starks note from 02/17/22 and pt to contact [facility] - follow up with me in 6-8 wks if needed    OT Occupational Profile and History Problem Focused Assessment - Including review of records relating to presenting problem    Occupational performance deficits (Please refer to evaluation for details): ADL's;IADL's;Leisure    Body Structure / Function / Physical Skills ADL;Fascial restriction;Flexibility;Scar mobility;ROM    Rehab Potential Good    Clinical Decision Making Limited treatment options, no task modification necessary    Comorbidities Affecting Occupational Performance: None    Modification or Assistance to Complete Evaluation  No modification of tasks or assist necessary to complete eval    OT Frequency Monthly    OT Duration 6 weeks    OT Treatment/Interventions Self-care/ADL training;Manual Therapy;Patient/family education;Therapeutic exercise;Scar mobilization    Consulted and Agree with Plan of Care Patient             Patient will benefit from skilled therapeutic intervention  in order to improve the following deficits and impairments:   Body Structure / Function / Physical Skills: ADL, Fascial restriction, Flexibility, Scar mobility, ROM       Visit Diagnosis: Scar tissue  Lymphedema, not elsewhere classified    Problem List Patient Active Problem List   Diagnosis Date Noted   Osteopenia 03/24/2022   Goals of care, counseling/discussion 09/22/2021   Malignant neoplasm of upper-outer quadrant of right breast in female, estrogen receptor positive ([place]) 09/12/2021   Abnormal mammogram of left breast 12/10/2020    Rosalyn Gess, OTR/L,CLT 03/30/2022, 4:52 PM  [facility] [address] [place], [address] Phone: [phone]   Fax:  [phone]  Name: ANNALIS KACZMARCZYK MRN: [id number] Date of Birth: 1951/01/21

## 2022-03-31 ENCOUNTER — Other Ambulatory Visit: Payer: Medicare PPO

## 2022-04-07 ENCOUNTER — Ambulatory Visit
Admission: RE | Admit: 2022-04-07 | Discharge: 2022-04-07 | Disposition: A | Discharge status: Home / Self Care | Payer: Medicare PPO | Source: Ambulatory Visit | Attending: Oncology | Admitting: Oncology

## 2022-04-07 DIAGNOSIS — C50411 Malignant neoplasm of upper-outer quadrant of right female breast: Secondary | ICD-10-CM | POA: Insufficient documentation

## 2022-04-07 DIAGNOSIS — Z170 Estrogen receptor positive status [ER+]: Secondary | ICD-10-CM

## 2022-04-07 DIAGNOSIS — R928 Other abnormal and inconclusive findings on diagnostic imaging of breast: Secondary | ICD-10-CM | POA: Diagnosis not present

## 2022-04-07 DIAGNOSIS — N6002 Solitary cyst of left breast: Secondary | ICD-10-CM | POA: Diagnosis not present

## 2022-04-13 ENCOUNTER — Ambulatory Visit (INDEPENDENT_AMBULATORY_CARE_PROVIDER_SITE_OTHER): Payer: Medicare PPO

## 2022-04-13 DIAGNOSIS — E538 Deficiency of other specified B group vitamins: Secondary | ICD-10-CM

## 2022-04-13 MED ORDER — CYANOCOBALAMIN 1000 MCG/ML IJ SOLN
1000.0000 ug | Freq: Once | INTRAMUSCULAR | Status: AC
  Administered 2022-04-13: 1000 ug via INTRAMUSCULAR

## 2022-05-11 ENCOUNTER — Encounter: Payer: Self-pay | Admitting: Nurse Practitioner

## 2022-05-25 ENCOUNTER — Ambulatory Visit (INDEPENDENT_AMBULATORY_CARE_PROVIDER_SITE_OTHER): Payer: Medicare PPO

## 2022-05-25 DIAGNOSIS — E538 Deficiency of other specified B group vitamins: Secondary | ICD-10-CM

## 2022-05-25 MED ORDER — CYANOCOBALAMIN 1000 MCG/ML IJ SOLN
1000.0000 ug | Freq: Once | INTRAMUSCULAR | Status: AC
  Administered 2022-05-25: 1000 ug via INTRAMUSCULAR

## 2022-05-27 DIAGNOSIS — Z961 Presence of intraocular lens: Secondary | ICD-10-CM | POA: Diagnosis not present

## 2022-05-27 DIAGNOSIS — H35033 Hypertensive retinopathy, bilateral: Secondary | ICD-10-CM | POA: Diagnosis not present

## 2022-05-27 DIAGNOSIS — H524 Presbyopia: Secondary | ICD-10-CM | POA: Diagnosis not present

## 2022-05-27 DIAGNOSIS — H18453 Nodular corneal degeneration, bilateral: Secondary | ICD-10-CM | POA: Diagnosis not present

## 2022-05-27 DIAGNOSIS — I10 Essential (primary) hypertension: Secondary | ICD-10-CM | POA: Diagnosis not present

## 2022-05-27 DIAGNOSIS — H16223 Keratoconjunctivitis sicca, not specified as Sjogren's, bilateral: Secondary | ICD-10-CM | POA: Diagnosis not present

## 2022-06-29 ENCOUNTER — Ambulatory Visit (INDEPENDENT_AMBULATORY_CARE_PROVIDER_SITE_OTHER): Payer: Medicare PPO

## 2022-06-29 DIAGNOSIS — E538 Deficiency of other specified B group vitamins: Secondary | ICD-10-CM

## 2022-06-29 MED ORDER — CYANOCOBALAMIN 1000 MCG/ML IJ SOLN
1000.0000 ug | Freq: Once | INTRAMUSCULAR | Status: AC
  Administered 2022-06-29: 1000 ug via INTRAMUSCULAR

## 2022-07-20 ENCOUNTER — Encounter: Payer: Self-pay | Admitting: Radiation Oncology

## 2022-07-20 ENCOUNTER — Ambulatory Visit (INDEPENDENT_AMBULATORY_CARE_PROVIDER_SITE_OTHER): Payer: Medicare PPO

## 2022-07-20 ENCOUNTER — Ambulatory Visit
Admission: RE | Admit: 2022-07-20 | Discharge: 2022-07-20 | Disposition: A | Discharge status: Home / Self Care | Payer: Medicare PPO | Source: Ambulatory Visit | Attending: Radiation Oncology | Admitting: Radiation Oncology

## 2022-07-20 VITALS — BP 135/70 | HR 75 | Temp 96.0°F | Resp 20 | Wt 152.6 lb

## 2022-07-20 DIAGNOSIS — C50911 Malignant neoplasm of unspecified site of right female breast: Secondary | ICD-10-CM | POA: Diagnosis not present

## 2022-07-20 DIAGNOSIS — E538 Deficiency of other specified B group vitamins: Secondary | ICD-10-CM

## 2022-07-20 DIAGNOSIS — Z170 Estrogen receptor positive status [ER+]: Secondary | ICD-10-CM | POA: Diagnosis not present

## 2022-07-20 DIAGNOSIS — C50211 Malignant neoplasm of upper-inner quadrant of right female breast: Secondary | ICD-10-CM

## 2022-07-20 MED ORDER — CYANOCOBALAMIN 1000 MCG/ML IJ SOLN
1000.0000 ug | Freq: Once | INTRAMUSCULAR | Status: AC
Start: 2022-07-20 — End: 2022-07-20
  Administered 2022-07-20: 1000 ug via INTRAMUSCULAR

## 2022-07-20 NOTE — Progress Notes (Signed)
Radiation Oncology Follow up Note  Name: Kristen Reid   Date:   07/20/2022 MRN:  [id number] DOB: 27-Oct-1950    This 72 y.o. female presents to the clinic today for 81-month follow-up status post whole breast radiation to her right breast for stage Ia ER positive invasive mammary carcinoma.  REFERRING PROVIDER: Sallyanne Kuster, NP  HPI: Patient is a 72 year old female now out 7 months having completed whole breast radiation to her right breast for stage Ia ER positive invasive mammary carcinoma.  Seen today in routine follow-up she is doing well.  Specifically denies breast tenderness cough or bone pain..  She had mammograms back in February which I have reviewed were BI-RADS 2 benign.  She is currently on Arimidex tolerating that well without side effect.  COMPLICATIONS OF TREATMENT: none  FOLLOW UP COMPLIANCE: keeps appointments   PHYSICAL EXAM:  BP 135/70   Pulse 75   Temp (!) 96 F (35.6 C)   Resp 20   Wt 152 lb 9.6 oz (69.2 kg)   SpO2 100%   BMI 27.47 kg/m  Lungs are clear to A&P cardiac examination essentially unremarkable with regular rate and rhythm. No dominant mass or nodularity is noted in either breast in 2 positions examined. Incision is well-healed. No axillary or supraclavicular adenopathy is appreciated. Cosmetic result is excellent.  Well-developed well-nourished patient in NAD. HEENT reveals PERLA, EOMI, discs not visualized.  Oral cavity is clear. No oral mucosal lesions are identified. Neck is clear without evidence of cervical or supraclavicular adenopathy. Lungs are clear to A&P. Cardiac examination is essentially unremarkable with regular rate and rhythm without murmur rub or thrill. Abdomen is benign with no organomegaly or masses noted. Motor sensory and DTR levels are equal and symmetric in the upper and lower extremities. Cranial nerves II through XII are grossly intact. Proprioception is intact. No peripheral adenopathy or edema is identified. No motor or  sensory levels are noted. Crude visual fields are within normal range.  RADIOLOGY RESULTS: Mammograms reviewed compatible with  PLAN: Present time patient is doing well 7 months out from whole breast radiation with no evidence of disease.  And pleased with her overall progress.  Of asked to see her back in 6 months for follow-up and then we will start once year follow-up visits.  Patient is to call with any concerns.  She continues on Arimidex without side effect.  I would like to take this opportunity to thank you for allowing me to participate in the care of your patient.[name], MD

## 2022-08-15 ENCOUNTER — Other Ambulatory Visit: Payer: Self-pay | Admitting: Oncology

## 2022-08-17 ENCOUNTER — Ambulatory Visit (INDEPENDENT_AMBULATORY_CARE_PROVIDER_SITE_OTHER): Payer: Medicare PPO

## 2022-08-17 DIAGNOSIS — E538 Deficiency of other specified B group vitamins: Secondary | ICD-10-CM

## 2022-08-17 MED ORDER — CYANOCOBALAMIN 1000 MCG/ML IJ SOLN
1000.0000 ug | Freq: Once | INTRAMUSCULAR | Status: AC
Start: 2022-08-17 — End: 2022-08-17
  Administered 2022-08-17: 1000 ug via INTRAMUSCULAR

## 2022-09-11 ENCOUNTER — Telehealth: Payer: Self-pay | Admitting: Nurse Practitioner

## 2022-09-11 DIAGNOSIS — I10 Essential (primary) hypertension: Secondary | ICD-10-CM

## 2022-09-11 DIAGNOSIS — E559 Vitamin D deficiency, unspecified: Secondary | ICD-10-CM

## 2022-09-11 DIAGNOSIS — E782 Mixed hyperlipidemia: Secondary | ICD-10-CM

## 2022-09-11 DIAGNOSIS — E538 Deficiency of other specified B group vitamins: Secondary | ICD-10-CM

## 2022-09-11 DIAGNOSIS — M8580 Other specified disorders of bone density and structure, unspecified site: Secondary | ICD-10-CM

## 2022-09-14 NOTE — Telephone Encounter (Signed)
Left message for patient to let her know that her labs been ordered.

## 2022-09-21 ENCOUNTER — Ambulatory Visit (INDEPENDENT_AMBULATORY_CARE_PROVIDER_SITE_OTHER): Payer: Medicare PPO

## 2022-09-21 DIAGNOSIS — E538 Deficiency of other specified B group vitamins: Secondary | ICD-10-CM | POA: Diagnosis not present

## 2022-09-21 MED ORDER — CYANOCOBALAMIN 1000 MCG/ML IJ SOLN
1000.0000 ug | Freq: Once | INTRAMUSCULAR | Status: AC
Start: 2022-09-21 — End: 2022-09-21
  Administered 2022-09-21: 1000 ug via INTRAMUSCULAR

## 2022-09-22 ENCOUNTER — Inpatient Hospital Stay: Payer: Medicare PPO

## 2022-09-22 ENCOUNTER — Encounter: Payer: Self-pay | Admitting: Oncology

## 2022-09-22 ENCOUNTER — Inpatient Hospital Stay: Payer: Medicare PPO | Attending: Oncology | Admitting: Oncology

## 2022-09-22 VITALS — BP 146/90 | HR 79 | Temp 97.5°F | Resp 18 | Wt 149.8 lb

## 2022-09-22 DIAGNOSIS — Z170 Estrogen receptor positive status [ER+]: Secondary | ICD-10-CM | POA: Insufficient documentation

## 2022-09-22 DIAGNOSIS — C50411 Malignant neoplasm of upper-outer quadrant of right female breast: Secondary | ICD-10-CM

## 2022-09-22 DIAGNOSIS — Z79811 Long term (current) use of aromatase inhibitors: Secondary | ICD-10-CM | POA: Diagnosis not present

## 2022-09-22 DIAGNOSIS — M8580 Other specified disorders of bone density and structure, unspecified site: Secondary | ICD-10-CM | POA: Diagnosis not present

## 2022-09-22 LAB — CBC WITH DIFFERENTIAL/PLATELET
Abs Immature Granulocytes: 0.01 10*3/uL (ref 0.00–0.07)
Basophils Absolute: 0 10*3/uL (ref 0.0–0.1)
Basophils Relative: 0 %
Eosinophils Absolute: 0 10*3/uL (ref 0.0–0.5)
Eosinophils Relative: 1 %
HCT: 43.2 % (ref 36.0–46.0)
Hemoglobin: 15 g/dL (ref 12.0–15.0)
Immature Granulocytes: 0 %
Lymphocytes Relative: 31 %
Lymphs Abs: 1.4 10*3/uL (ref 0.7–4.0)
MCH: 32.5 pg (ref 26.0–34.0)
MCHC: 34.7 g/dL (ref 30.0–36.0)
MCV: 93.7 fL (ref 80.0–100.0)
Monocytes Absolute: 0.3 10*3/uL (ref 0.1–1.0)
Monocytes Relative: 6 %
Neutro Abs: 2.8 10*3/uL (ref 1.7–7.7)
Neutrophils Relative %: 62 %
Platelets: 204 10*3/uL (ref 150–400)
RBC: 4.61 MIL/uL (ref 3.87–5.11)
RDW: 12.1 % (ref 11.5–15.5)
WBC: 4.6 10*3/uL (ref 4.0–10.5)
nRBC: 0 % (ref 0.0–0.2)

## 2022-09-22 LAB — COMPREHENSIVE METABOLIC PANEL
ALT: 42 U/L (ref 0–44)
AST: 44 U/L — ABNORMAL HIGH (ref 15–41)
Albumin: 4.3 g/dL (ref 3.5–5.0)
Alkaline Phosphatase: 69 U/L (ref 38–126)
Anion gap: 9 (ref 5–15)
BUN: 13 mg/dL (ref 8–23)
CO2: 27 mmol/L (ref 22–32)
Calcium: 9.8 mg/dL (ref 8.9–10.3)
Chloride: 101 mmol/L (ref 98–111)
Creatinine, Ser: 0.56 mg/dL (ref 0.44–1.00)
GFR, Estimated: >60 mL/min (ref >60)
Glucose, Bld: 148 mg/dL — ABNORMAL HIGH (ref 70–99)
Potassium: 3.5 mmol/L (ref 3.5–5.1)
Sodium: 137 mmol/L (ref 135–145)
Total Bilirubin: 0.7 mg/dL (ref 0.3–1.2)
Total Protein: 7.5 g/dL (ref 6.5–8.1)

## 2022-09-22 MED ORDER — ANASTROZOLE 1 MG PO TABS: 1.0000 mg | ORAL_TABLET | Freq: Every day | ORAL | 1 refills | Status: DC

## 2022-09-22 NOTE — Assessment & Plan Note (Addendum)
Right upper breast invasive carcinoma plus DCIS, ER/PR positive, HER2 negative.  Ki-67 20%. Axillary LN negative.  pT1c pN0 Oncotype DX 18, no adjuvant chemotherapy.  S/p Adjuvant Radiation.  Continue -Arimidex 1mg  daily, Refills sent to pharmacy. Duration 5 years, till Oct 2028 Continue annual mammogram, recent mammogram results were reviewed with patient.

## 2022-09-22 NOTE — Assessment & Plan Note (Addendum)
Continue calcium and vitamin D supplementation.  Repeat DEXA

## 2022-09-22 NOTE — Progress Notes (Signed)
Survivorship Care Plan visit completed.  Treatment summary reviewed and given to patient.  ASCO answers booklet reviewed and given to patient.  CARE program and Cancer Transitions discussed with patient along with other resources cancer center offers to patients and caregivers.  Patient verbalized understanding.    

## 2022-09-22 NOTE — Progress Notes (Signed)
Hematology/Oncology Progress note [phone] [phone]            Patient Care Team: Lyndon Code, MD as PCP - General (Internal Medicine) Hulen Luster, RN as Oncology Nurse Navigator [name], MD as Consulting Physician (Radiation Oncology) Rickard Patience, MD as Consulting Physician (Oncology) Griselda Miner, MD as Consulting Physician (General Surgery)  ASSESSMENT & PLAN:   Cancer Staging  Malignant neoplasm of upper-outer quadrant of right breast in female, estrogen receptor positive [facility]) Staging form: Breast, AJCC 8th Edition - Pathologic stage from 10/02/2021: Stage IA (pT1c, pN0, cM0, G2, ER+, PR+, HER2-, Oncotype DX score: 18) - Signed by Rickard Patience, MD on 12/22/2021   Malignant neoplasm of upper-outer quadrant of right breast in female, estrogen receptor positive (HCC) Right upper breast invasive carcinoma plus DCIS, ER/PR positive, HER2 negative.  Ki-67 20%. Axillary LN negative.  pT1c pN0 Oncotype DX 18, no adjuvant chemotherapy.  S/p Adjuvant Radiation.  Continue -Arimidex 1mg  daily, Refills sent to pharmacy. Duration 5 years, till Oct 2028 Continue annual mammogram, recent mammogram results were reviewed with patient.   Osteopenia Continue calcium and vitamin D supplementation.  Repeat DEXA   Orders Placed This Encounter  Procedures   DG Bone Density    Standing Status:   Future    Standing Expiration Date:   09/22/2023    Order Specific Question:   Reason for Exam (SYMPTOM  OR DIAGNOSIS REQUIRED)    Answer:   osteopenia    Order Specific Question:   Preferred imaging location?    Answer:   [facility]   CMP (Cancer Center only)    Standing Status:   Future    Standing Expiration Date:   09/22/2023   CBC with Differential (Cancer Center Only)    Standing Status:   Future    Standing Expiration Date:   09/22/2023   Follow up in 6 months All questions were answered. The patient knows to call the clinic with any problems,  questions or concerns. Rickard Patience, MD, PhD [facility] Hematology Oncology 09/22/2022    CHIEF COMPLAINTS/REASON FOR VISIT:  Follow up for Stage I breast cancer  HISTORY OF PRESENTING ILLNESS:   Kristen Reid is a  72 y.o.  female presents for follow up of  right breast cancer Oncology history listed as below Oncology History  Malignant neoplasm of upper-outer quadrant of right breast in female, estrogen receptor positive (HCC)  10/10/2019 Mammogram   Unilateral breast mammogram showed partially obscured mass with associated architectural distortion within the upper slightly inner right breast.  Without definitive sonographic correlates.  Stereotactic biopsy was recommended.   10/03/2020 Imaging   1. Stable appearance of RIGHT breast architectural distortion which demonstrated fibrosis and fibrocystic change. X clip is in appropriate position. Recommend surgical consultation for consideration of excision versus close monitoring. 2. No mammographic evidence of malignancy in the LEFT breast   05/02/2021 Mammogram   Unilateral diagnostic mammogram right 1. Possible slight increase in size of the distorted mass at the site of the X shaped biopsy marking clip in the upper-inner right breast. 2.  No evidence of right axillary lymphadenopathy.   09/12/2021 Initial Diagnosis   Malignant neoplasm of upper-outer quadrant of right breast in female, estrogen receptor positive (HCC)  -10/06/2019 right breast superior stereotactic core needle biopsy showed benign mammary parenchyma with stromal fibrosis, fibrocystic changes, and focal usual ductal hyperplasia.  Negative for atypical proliferative breast disease. -08/20/2021, right breast lumpectomy showed invasive moderately differentiated ductal  adenocarcinoma, grade 2, associated with DCIS in situ, intermediate nuclear grade, solid type with focal necrosis.  Margins free (invasive tumor 1 mm from superior margin; DCIS 1 mm from superior margin) pT1c  pNx    10/02/2021 Cancer Staging   Staging form: Breast, AJCC 8th Edition - Pathologic stage from 10/02/2021: Stage IA (pT1c, pN0, cM0, G2, ER+, PR+, HER2-, Oncotype DX score: 18) - Signed by Rickard Patience, MD on 12/22/2021 Stage prefix: Initial diagnosis Multigene prognostic tests performed: Oncotype DX Recurrence score range: Greater than or equal to 11 Histologic grading system: 3 grade system   10/02/2021 Surgery   Patient underwent sentinel lymph node biopsy.  All 4 lymph nodes are negative.   10/06/2021 Oncotype testing   Oncotype DX recurrence score 18, distant recurrence risk at 9 years 5%, group average absolute chemotherapy benefit less than 1%.   11/27/2021 - 12/08/2021 Radiation Therapy   Adjuvant right breast radiation.    12/22/2021 -  Anti-estrogen oral therapy   Started on Arimidex 1mg  daily.     Menarche 72 years of age Menopause in 50s. Very short period of time of hormone replacement therapy, less than 1 year Previous OCP for couple of years. Denies any family history of breast cancer.  Great aunt was diagnosed with uterine cancer.  No other cancer family history.  INTERVAL HISTORY Kristen Reid is a 72 y.o. female who has above history reviewed by me today presents for follow up visit for Stage IA right breast cancer. She tolerates Arimidex with manageable side effects, joint pain.    Review of Systems  Constitutional:  Negative for appetite change, chills, fatigue and fever.  HENT:   Negative for hearing loss and voice change.   Eyes:  Negative for eye problems.  Respiratory:  Negative for chest tightness and cough.   Cardiovascular:  Negative for chest pain.  Gastrointestinal:  Negative for abdominal distention, abdominal pain and blood in stool.  Endocrine: Negative for hot flashes.  Genitourinary:  Negative for difficulty urinating and frequency.   Musculoskeletal:  Negative for arthralgias.  Skin:  Negative for itching and rash.  Neurological:  Negative  for extremity weakness.  Hematological:  Negative for adenopathy.  Psychiatric/Behavioral:  Negative for confusion.     MEDICAL HISTORY:  Past Medical History:  Diagnosis Date   Arthritis    back hands   Breast cancer (HCC)    Breast mass, right    Cancer (HCC)    Cataract    GERD (gastroesophageal reflux disease)    Hyperlipidemia    Hypertension     SURGICAL HISTORY: Past Surgical History:  Procedure Laterality Date   BASAL CELL CARCINOMA EXCISION  02/03/2021   on nose   BREAST BIOPSY Right 2010 and 04/18/2015   BREAST BIOPSY Right 10/26/2019   x clip path pending mass w/ distortion   BREAST EXCISIONAL BIOPSY Right 08/20/2021   Invasive moderately differentiated ductal adenocarcinoma, grade 2   BREAST LUMPECTOMY WITH RADIOACTIVE SEED LOCALIZATION Right 08/20/2021   Procedure: RIGHT BREAST LUMPECTOMY WITH RADIOACTIVE SEED LOCALIZATION;  Surgeon: Griselda Miner, MD;  Location: [facility];  Service: General;  Laterality: Right;   CATARACT EXTRACTION Right 04/23/2021   CATARACT EXTRACTION Left 05/04/2021   CESAREAN SECTION     corneal saltzman nodules removed Left 03/28/2021   ECTOPIC PREGNANCY SURGERY  1981   2   EYE SURGERY Right    keratectomy    ROBOTIC ASSISTED LAPAROSCOPIC OVARIAN CYSTECTOMY     SENTINEL NODE BIOPSY  Right 10/02/2021   Procedure: RIGHT AXILLARY SENTINEL NODE BIOPSY;  Surgeon: Griselda Miner, MD;  Location: [facility];  Service: General;  Laterality: Right;   TONSILECTOMY/ADENOIDECTOMY WITH MYRINGOTOMY  1960    SOCIAL HISTORY: Social History   Socioeconomic History   Marital status: Married    Spouse name: Not on file   Number of children: Not on file   Years of education: Not on file   Highest education level: Not on file  Occupational History   Not on file  Tobacco Use   Smoking status: Never   Smokeless tobacco: Never  Vaping Use   Vaping status: Never Used  Substance and Sexual Activity   Alcohol use:  Yes    Comment: occasionally / socially   Drug use: Never   Sexual activity: Yes    Birth control/protection: Post-menopausal  Other Topics Concern   Not on file  Social History Narrative   Not on file   Social Determinants of Health   Financial Resource Strain: Not on file  Food Insecurity: Not on file  Transportation Needs: Not on file  Physical Activity: Not on file  Stress: Not on file  Social Connections: Not on file  Intimate Partner Violence: Not on file    FAMILY HISTORY: Family History  Problem Relation Age of Onset   Heart disease Mother    Stroke Father    Skin cancer Father    Diabetes Brother    Diabetes Brother    Cervical cancer Paternal Aunt    Breast cancer Neg Hx     ALLERGIES:  has No Known Allergies.  MEDICATIONS:  Current Outpatient Medications  Medication Sig Dispense Refill   Apple Cider Vinegar 300 MG TABS Take by mouth.     calcium carbonate (OS-CAL) 600 MG TABS tablet Take 600 mg by mouth 2 (two) times daily with a meal.     Cholecalciferol (VITAMIN D3) 2000 units TABS Take by mouth.     clobetasol (TEMOVATE) 0.05 % external solution APPLY TO AFFECTED AREA EVERY DAY AT BEDTIME     Coenzyme Q10 (COQ10) 200 MG CAPS Take by mouth.     dutasteride (AVODART) 0.5 MG capsule TAKE ONE PILL DAILY X 1 WEEK, THEN ONE PILL ONCE WEEKLY     famotidine (PEPCID) 20 MG tablet Take 1 tablet (20 mg total) by mouth daily. 90 tablet 3   fluticasone (VERAMYST) 27.5 MCG/SPRAY nasal spray Place 2 sprays into the nose daily.     loratadine (CLARITIN) 10 MG tablet Take 10 mg by mouth daily.     MILK THISTLE PO Take by mouth.     VEVYE 0.1 % SOLN Apply 1 drop to eye 2 (two) times daily.     Vit B12-Methionine-Inos-Chol SOLN Inject into the muscle every 30 (thirty) days.     anastrozole (ARIMIDEX) 1 MG tablet Take 1 tablet (1 mg total) by mouth daily. 90 tablet 1   Current Facility-Administered Medications  Medication Dose Route Frequency Provider Last Rate Last  Admin   cyanocobalamin (VITAMIN B12) injection 1,000 mcg  1,000 mcg Intramuscular Once Abernathy, Alyssa, NP         PHYSICAL EXAMINATION: ECOG PERFORMANCE STATUS: 1 - Symptomatic but completely ambulatory Vitals:   09/22/22 1340  BP: (!) 146/90  Pulse: 79  Resp: 18  Temp: (!) 97.5 F (36.4 C)   Filed Weights   09/22/22 1340  Weight: 149 lb 12.8 oz (67.9 kg)    Physical Exam Constitutional:  General: She is not in acute distress. HENT:     Head: Normocephalic and atraumatic.  Eyes:     General: No scleral icterus. Cardiovascular:     Rate and Rhythm: Normal rate and regular rhythm.     Heart sounds: Normal heart sounds.  Pulmonary:     Effort: Pulmonary effort is normal. No respiratory distress.     Breath sounds: No wheezing.  Abdominal:     General: Bowel sounds are normal. There is no distension.     Palpations: Abdomen is soft.  Musculoskeletal:        General: No deformity. Normal range of motion.     Cervical back: Normal range of motion and neck supple.  Skin:    General: Skin is warm and dry.     Findings: No erythema or rash.  Neurological:     Mental Status: She is alert and oriented to person, place, and time. Mental status is at baseline.     Cranial Nerves: No cranial nerve deficit.     Coordination: Coordination normal.  Psychiatric:        Mood and Affect: Mood normal.   Breast exam was performed in seated and lying down position. Patient is status post right lumpectomy with a well-healed surgical scar.  Minimal right breast edema. No palpable breast masses bilaterally.  No palpable axillary adenopathy bilaterally.   LABORATORY DATA:  I have reviewed the data as listed    Latest Ref Rng & Units 09/22/2022    1:27 PM 03/24/2022    1:46 PM 11/26/2021    1:05 PM  CBC  WBC 4.0 - 10.5 K/uL 4.6  3.9  3.8   Hemoglobin 12.0 - 15.0 g/dL 24.4  01.0  27.2   Hematocrit 36.0 - 46.0 % 43.2  39.1  42.7   Platelets 150 - 400 K/uL 204  204  215        Latest Ref Rng & Units 09/22/2022    1:27 PM 03/24/2022    1:46 PM 01/09/2022    7:34 AM  CMP  Glucose 70 - 99 mg/dL 536  644    BUN 8 - 23 mg/dL 13  15    Creatinine 0.34 - 1.00 mg/dL 7.42  5.95    Sodium 638 - 145 mmol/L 137  139    Potassium 3.5 - 5.1 mmol/L 3.5  3.8    Chloride 98 - 111 mmol/L 101  98    CO2 22 - 32 mmol/L 27  30    Calcium 8.9 - 10.3 mg/dL 9.8  9.2    Total Protein 6.5 - 8.1 g/dL 7.5  7.1  6.5   Total Bilirubin 0.3 - 1.2 mg/dL 0.7  0.8  0.8   Alkaline Phos 38 - 126 U/L 69  60  63   AST 15 - 41 U/L 44  34  22   ALT 0 - 44 U/L 42  33  24      RADIOGRAPHIC STUDIES: I have personally reviewed the radiological images as listed and agreed with the findings in the report. No results found.

## 2022-09-22 NOTE — Progress Notes (Signed)
Pt here for follow up. No new breast problems.

## 2022-09-28 ENCOUNTER — Ambulatory Visit: Payer: Medicare PPO | Admitting: Nurse Practitioner

## 2022-09-30 ENCOUNTER — Ambulatory Visit: Payer: Medicare PPO | Admitting: Nurse Practitioner

## 2022-10-02 DIAGNOSIS — Z170 Estrogen receptor positive status [ER+]: Secondary | ICD-10-CM | POA: Diagnosis not present

## 2022-10-02 DIAGNOSIS — C50411 Malignant neoplasm of upper-outer quadrant of right female breast: Secondary | ICD-10-CM | POA: Diagnosis not present

## 2022-10-05 DIAGNOSIS — H0288A Meibomian gland dysfunction right eye, upper and lower eyelids: Secondary | ICD-10-CM | POA: Diagnosis not present

## 2022-10-05 DIAGNOSIS — L718 Other rosacea: Secondary | ICD-10-CM | POA: Diagnosis not present

## 2022-10-05 DIAGNOSIS — H16223 Keratoconjunctivitis sicca, not specified as Sjogren's, bilateral: Secondary | ICD-10-CM | POA: Diagnosis not present

## 2022-10-05 DIAGNOSIS — H18453 Nodular corneal degeneration, bilateral: Secondary | ICD-10-CM | POA: Diagnosis not present

## 2022-10-05 DIAGNOSIS — H0288B Meibomian gland dysfunction left eye, upper and lower eyelids: Secondary | ICD-10-CM | POA: Diagnosis not present

## 2022-10-05 DIAGNOSIS — H1045 Other chronic allergic conjunctivitis: Secondary | ICD-10-CM | POA: Diagnosis not present

## 2022-10-07 DIAGNOSIS — E559 Vitamin D deficiency, unspecified: Secondary | ICD-10-CM | POA: Diagnosis not present

## 2022-10-07 DIAGNOSIS — E538 Deficiency of other specified B group vitamins: Secondary | ICD-10-CM | POA: Diagnosis not present

## 2022-10-07 DIAGNOSIS — M8580 Other specified disorders of bone density and structure, unspecified site: Secondary | ICD-10-CM | POA: Diagnosis not present

## 2022-10-07 DIAGNOSIS — I10 Essential (primary) hypertension: Secondary | ICD-10-CM | POA: Diagnosis not present

## 2022-10-07 DIAGNOSIS — E782 Mixed hyperlipidemia: Secondary | ICD-10-CM | POA: Diagnosis not present

## 2022-10-09 ENCOUNTER — Encounter: Payer: Self-pay | Admitting: Nurse Practitioner

## 2022-10-09 ENCOUNTER — Ambulatory Visit: Payer: Medicare PPO | Admitting: Nurse Practitioner

## 2022-10-09 VITALS — BP 138/88 | HR 61 | Temp 98.6°F | Resp 16 | Ht 62.5 in | Wt 147.2 lb

## 2022-10-09 DIAGNOSIS — Z0001 Encounter for general adult medical examination with abnormal findings: Secondary | ICD-10-CM

## 2022-10-09 DIAGNOSIS — E782 Mixed hyperlipidemia: Secondary | ICD-10-CM | POA: Diagnosis not present

## 2022-10-09 DIAGNOSIS — K219 Gastro-esophageal reflux disease without esophagitis: Secondary | ICD-10-CM

## 2022-10-09 DIAGNOSIS — R300 Dysuria: Secondary | ICD-10-CM

## 2022-10-09 MED ORDER — FAMOTIDINE 20 MG PO TABS
20.0000 mg | ORAL_TABLET | Freq: Every day | ORAL | 3 refills | Status: DC
Start: 2022-10-09 — End: 2023-12-01

## 2022-10-09 NOTE — Progress Notes (Signed)
[facility] [address] [place], [place] [address]  Internal MEDICINE  Office Visit Note  Patient Name: Kristen Reid  [id number]  [id number]  Date of Service: 10/09/2022  Chief Complaint  Patient presents with   Gastroesophageal Reflux   Hypertension   Hyperlipidemia   Medicare Wellness    HPI Kristen Reid presents for an annual well visit and physical exam.  Well-appearing 72 y.o. female with GERD, high cholesterol and  recent history of breast cancer and is following up with oncology regularly. She is currently taking anastrozole which is managed by oncology.  Routine CRC screening: plans to call Dr. Horace Porteous office to find out when she is due for colonoscopy again. Routine mammogram: monitored regularly through oncology.  DEXA scan: scheduled for september Pap smear: planning to do 1 more time at a follow up  Labs: results discussed with patient, LDL elevated at 190 but improving.  New or worsening pain: none, right ankle hurts sometimes.  Other concerns: none      10/09/2022    8:45 AM 09/24/2021   11:18 AM 09/09/2020   11:11 AM  MMSE - Mini Mental State Exam  Orientation to time 5 5 5   Orientation to Place 5 5 5   Registration 3 3 3   Attention/ Calculation 5 5 5   Recall 3 3 3   Language- name 2 objects 2 2 2   Language- repeat 1 1 1   Language- follow 3 step command 3 3 3   Language- read & follow direction 1 1 1   Write a sentence 1 1 1   Copy design 1 1 1   Total score 30 30 30     Functional Status Survey: Is the patient deaf or have difficulty hearing?: No Does the patient have difficulty seeing, even when wearing glasses/contacts?: No Does the patient have difficulty concentrating, remembering, or making decisions?: No Does the patient have difficulty walking or climbing stairs?: No Does the patient have difficulty dressing or bathing?: No Does the patient have difficulty doing errands alone such as visiting a doctor's office or shopping?: No      10/02/2021   10:19 AM 10/20/2021    1:39 PM 01/16/2022   10:58 AM 01/19/2022    1:31 PM 10/09/2022    8:44 AM  Fall Risk  Falls in the past year?   0  0  Was there an injury with Fall?   0  0  Fall Risk Category Calculator   0  0  Fall Risk Category (Retired)   Low    (RETIRED) Patient Fall Risk Level Low fall risk Low fall risk Low fall risk Low fall risk   Patient at Risk for Falls Due to   No Fall Risks  No Fall Risks  Fall risk Follow up   Falls evaluation completed  Falls evaluation completed       10/09/2022    8:44 AM  Depression screen PHQ 2/9  Decreased Interest 0  Down, Depressed, Hopeless 0  PHQ - 2 Score 0        Current Medication: Outpatient Encounter Medications as of 10/09/2022  Medication Sig   anastrozole (ARIMIDEX) 1 MG tablet Take 1 tablet (1 mg total) by mouth daily.   Apple Cider Vinegar 300 MG TABS Take by mouth.   calcium carbonate (OS-CAL) 600 MG TABS tablet Take 600 mg by mouth 2 (two) times daily with a meal.   Cholecalciferol (VITAMIN D3) 2000 units TABS Take by mouth.   clobetasol (TEMOVATE) 0.05 % external solution APPLY TO  AFFECTED AREA EVERY DAY AT BEDTIME   Coenzyme Q10 (COQ10) 200 MG CAPS Take by mouth.   dutasteride (AVODART) 0.5 MG capsule TAKE ONE PILL DAILY X 1 WEEK, THEN ONE PILL ONCE WEEKLY   fluticasone (VERAMYST) 27.5 MCG/SPRAY nasal spray Place 2 sprays into the nose daily.   loratadine (CLARITIN) 10 MG tablet Take 10 mg by mouth daily.   MILK THISTLE PO Take by mouth.   VEVYE 0.1 % SOLN Apply 1 drop to eye 2 (two) times daily.   Vit B12-Methionine-Inos-Chol SOLN Inject into the muscle every 30 (thirty) days.   [DISCONTINUED] famotidine (PEPCID) 20 MG tablet Take 1 tablet (20 mg total) by mouth daily.   famotidine (PEPCID) 20 MG tablet Take 1 tablet (20 mg total) by mouth daily.   Facility-Administered Encounter Medications as of 10/09/2022  Medication   cyanocobalamin (VITAMIN B12) injection 1,000 mcg    Surgical History: Past  Surgical History:  Procedure Laterality Date   BASAL CELL CARCINOMA EXCISION  02/03/2021   on nose   BREAST BIOPSY Right 2010 and 04/18/2015   BREAST BIOPSY Right 10/26/2019   x clip path pending mass w/ distortion   BREAST EXCISIONAL BIOPSY Right 08/20/2021   Invasive moderately differentiated ductal adenocarcinoma, grade 2   BREAST LUMPECTOMY WITH RADIOACTIVE SEED LOCALIZATION Right 08/20/2021   Procedure: RIGHT BREAST LUMPECTOMY WITH RADIOACTIVE SEED LOCALIZATION;  Surgeon: Griselda Miner, MD;  Location: [facility];  Service: General;  Laterality: Right;   CATARACT EXTRACTION Right 04/23/2021   CATARACT EXTRACTION Left 05/04/2021   CESAREAN SECTION     corneal saltzman nodules removed Left 03/28/2021   ECTOPIC PREGNANCY SURGERY  1981   2   EYE SURGERY Right    keratectomy    ROBOTIC ASSISTED LAPAROSCOPIC OVARIAN CYSTECTOMY     SENTINEL NODE BIOPSY Right 10/02/2021   Procedure: RIGHT AXILLARY SENTINEL NODE BIOPSY;  Surgeon: Griselda Miner, MD;  Location:  SURGERY CENTER;  Service: General;  Laterality: Right;   TONSILECTOMY/ADENOIDECTOMY WITH MYRINGOTOMY  1960    Medical History: Past Medical History:  Diagnosis Date   Arthritis    back hands   Breast cancer (HCC)    Breast mass, right    Cancer (HCC)    Cataract    GERD (gastroesophageal reflux disease)    Hyperlipidemia    Hypertension     Family History: Family History  Problem Relation Age of Onset   Heart disease Mother    Stroke Father    Skin cancer Father    Diabetes Brother    Diabetes Brother    Cervical cancer Paternal Aunt    Breast cancer Neg Hx     Social History   Socioeconomic History   Marital status: Married    Spouse name: Not on file   Number of children: Not on file   Years of education: Not on file   Highest education level: Not on file  Occupational History   Not on file  Tobacco Use   Smoking status: Never   Smokeless tobacco: Never  Vaping Use    Vaping status: Never Used  Substance and Sexual Activity   Alcohol use: Yes    Comment: occasionally / socially   Drug use: Never   Sexual activity: Yes    Birth control/protection: Post-menopausal  Other Topics Concern   Not on file  Social History Narrative   Not on file   Social Determinants of Health   Financial Resource Strain: Not on file  Food Insecurity: Not on file  Transportation Needs: Not on file  Physical Activity: Not on file  Stress: Not on file  Social Connections: Not on file  Intimate Partner Violence: Not on file      Review of Systems  Constitutional:  Negative for activity change, appetite change, chills, fatigue, fever and unexpected weight change.  HENT: Negative.  Negative for congestion, ear pain, rhinorrhea, sore throat and trouble swallowing.   Eyes: Negative.   Respiratory: Negative.  Negative for cough, chest tightness, shortness of breath and wheezing.   Cardiovascular: Negative.  Negative for chest pain.  Gastrointestinal: Negative.  Negative for abdominal pain, blood in stool, constipation, diarrhea, nausea and vomiting.  Endocrine: Negative.   Genitourinary: Negative.  Negative for difficulty urinating, dysuria, frequency, hematuria and urgency.  Musculoskeletal: Negative.  Negative for arthralgias, back pain, joint swelling, myalgias and neck pain.  Skin: Negative.  Negative for rash and wound.  Allergic/Immunologic: Negative.  Negative for immunocompromised state.  Neurological: Negative.  Negative for dizziness, seizures, numbness and headaches.  Hematological: Negative.   Psychiatric/Behavioral: Negative.  Negative for behavioral problems, self-injury and suicidal ideas. The patient is not nervous/anxious.     Vital Signs: BP 138/88   Pulse 61   Temp 98.6 F (37 C)   Resp 16   Ht 5' 2.5" (1.588 m)   Wt 147 lb 3.2 oz (66.8 kg)   SpO2 96%   BMI 26.49 kg/m    Physical Exam Vitals reviewed.  Constitutional:      General: She  is not in acute distress.    Appearance: Normal appearance. She is well-developed. She is not ill-appearing or diaphoretic.  HENT:     Head: Normocephalic and atraumatic.     Right Ear: Tympanic membrane, ear canal and external ear normal.     Left Ear: Ear canal and external ear normal.     Nose: Nose normal.     Mouth/Throat:     Mouth: Mucous membranes are moist.     Pharynx: Oropharynx is clear. No oropharyngeal exudate or posterior oropharyngeal erythema.  Eyes:     General: No scleral icterus.       Right eye: No discharge.        Left eye: No discharge.     Extraocular Movements: Extraocular movements intact.     Conjunctiva/sclera: Conjunctivae normal.     Pupils: Pupils are equal, round, and reactive to light.  Neck:     Thyroid: No thyromegaly.     Vascular: No JVD.     Trachea: No tracheal deviation.     Comments: Slightly decreased ROM  Cardiovascular:     Rate and Rhythm: Normal rate and regular rhythm.     Pulses: Normal pulses.     Heart sounds: Normal heart sounds. No murmur heard.    No friction rub. No gallop.  Pulmonary:     Effort: Pulmonary effort is normal. No respiratory distress.     Breath sounds: Normal breath sounds. No stridor. No wheezing or rales.  Chest:     Chest wall: No tenderness.     Comments: Declined clinical breast exam. Recently had breast exam by oncology and has upcoming mammogram.  Abdominal:     General: Bowel sounds are normal. There is no distension.     Palpations: Abdomen is soft. There is no mass.     Tenderness: There is no abdominal tenderness. There is no guarding or rebound.  Musculoskeletal:        General:  No tenderness or deformity. Normal range of motion.     Cervical back: Normal range of motion and neck supple. No rigidity or tenderness.  Lymphadenopathy:     Cervical: No cervical adenopathy.  Skin:    General: Skin is warm and dry.     Capillary Refill: Capillary refill takes less than 2 seconds.     Coloration:  Skin is not pale.     Findings: No erythema or rash.  Neurological:     Mental Status: She is alert and oriented to person, place, and time.     Cranial Nerves: No cranial nerve deficit.     Motor: No abnormal muscle tone.     Coordination: Coordination normal.     Deep Tendon Reflexes: Reflexes are normal and symmetric.  Psychiatric:        Mood and Affect: Mood normal.        Behavior: Behavior normal.        Thought Content: Thought content normal.        Judgment: Judgment normal.        Assessment/Plan: 1. Encounter for routine adult health examination with abnormal findings Age-appropriate preventive screenings and vaccinations discussed, annual physical exam completed. Routine labs for health maintenance results discussed with patient today. PHM updated.   2. Gastroesophageal reflux disease without esophagitis Continue famotidine as prescribed. Refills ordered  - famotidine (PEPCID) 20 MG tablet; Take 1 tablet (20 mg total) by mouth daily.  Dispense: 90 tablet; Refill: 3  3. Mixed hyperlipidemia Continue diet modifications. Aware anastrazole can elevated cholesterol levels.   4. Dysuria Routine urinalysis done - UA/M w/rflx Culture, Routine      General Counseling: Kristen Reid verbalizes understanding of the findings of todays visit and agrees with plan of treatment. I have discussed any further diagnostic evaluation that may be needed or ordered today. We also reviewed her medications today. she has been encouraged to call the office with any questions or concerns that should arise related to todays visit.    Orders Placed This Encounter  Procedures   UA/M w/rflx Culture, Routine    Meds ordered this encounter  Medications   famotidine (PEPCID) 20 MG tablet    Sig: Take 1 tablet (20 mg total) by mouth daily.    Dispense:  90 tablet    Refill:  3    For future refills    Return for F/U, PAP ONLY,  PCP schedule at patient preference. .   Total time  spent:30 Minutes Time spent includes review of chart, medications, test results, and follow up plan with the patient.   [place] Controlled Substance Database was reviewed by me.  This patient was seen by Sallyanne Kuster, FNP-C in collaboration with Dr. Beverely Risen as a part of collaborative care agreement.   R. Tedd Sias, MSN, FNP-C Internal medicine

## 2022-10-19 ENCOUNTER — Ambulatory Visit (INDEPENDENT_AMBULATORY_CARE_PROVIDER_SITE_OTHER): Payer: Medicare PPO

## 2022-10-19 DIAGNOSIS — E538 Deficiency of other specified B group vitamins: Secondary | ICD-10-CM

## 2022-10-19 MED ORDER — CYANOCOBALAMIN 1000 MCG/ML IJ SOLN
1000.0000 ug | Freq: Once | INTRAMUSCULAR | Status: AC
Start: 2022-10-19 — End: 2022-10-19
  Administered 2022-10-19: 1000 ug via INTRAMUSCULAR

## 2022-11-06 ENCOUNTER — Other Ambulatory Visit: Payer: Medicare PPO

## 2022-11-16 ENCOUNTER — Ambulatory Visit (INDEPENDENT_AMBULATORY_CARE_PROVIDER_SITE_OTHER): Payer: Medicare PPO

## 2022-11-16 DIAGNOSIS — E538 Deficiency of other specified B group vitamins: Secondary | ICD-10-CM | POA: Diagnosis not present

## 2022-11-16 MED ORDER — CYANOCOBALAMIN 1000 MCG/ML IJ SOLN
1000.0000 ug | Freq: Once | INTRAMUSCULAR | Status: AC
Start: 2022-11-16 — End: 2022-11-16
  Administered 2022-11-16: 1000 ug via INTRAMUSCULAR

## 2022-11-20 ENCOUNTER — Ambulatory Visit
Admission: RE | Admit: 2022-11-20 | Discharge: 2022-11-20 | Disposition: A | Discharge status: Home / Self Care | Payer: Medicare PPO | Source: Ambulatory Visit | Attending: Oncology | Admitting: Oncology

## 2022-11-20 DIAGNOSIS — M8589 Other specified disorders of bone density and structure, multiple sites: Secondary | ICD-10-CM | POA: Insufficient documentation

## 2022-11-20 DIAGNOSIS — M85852 Other specified disorders of bone density and structure, left thigh: Secondary | ICD-10-CM | POA: Diagnosis not present

## 2022-11-20 DIAGNOSIS — M8580 Other specified disorders of bone density and structure, unspecified site: Secondary | ICD-10-CM

## 2022-11-20 DIAGNOSIS — Z780 Asymptomatic menopausal state: Secondary | ICD-10-CM | POA: Insufficient documentation

## 2022-12-03 DIAGNOSIS — H16223 Keratoconjunctivitis sicca, not specified as Sjogren's, bilateral: Secondary | ICD-10-CM | POA: Diagnosis not present

## 2022-12-03 DIAGNOSIS — H0288A Meibomian gland dysfunction right eye, upper and lower eyelids: Secondary | ICD-10-CM | POA: Diagnosis not present

## 2022-12-03 DIAGNOSIS — H0288B Meibomian gland dysfunction left eye, upper and lower eyelids: Secondary | ICD-10-CM | POA: Diagnosis not present

## 2022-12-03 DIAGNOSIS — H18453 Nodular corneal degeneration, bilateral: Secondary | ICD-10-CM | POA: Diagnosis not present

## 2022-12-03 DIAGNOSIS — L718 Other rosacea: Secondary | ICD-10-CM | POA: Diagnosis not present

## 2022-12-10 ENCOUNTER — Ambulatory Visit: Payer: Medicare PPO | Admitting: Nurse Practitioner

## 2022-12-10 ENCOUNTER — Encounter: Payer: Self-pay | Admitting: Nurse Practitioner

## 2022-12-10 VITALS — BP 130/82 | HR 92 | Temp 98.4°F | Resp 16 | Ht 62.5 in | Wt 150.0 lb

## 2022-12-10 DIAGNOSIS — E538 Deficiency of other specified B group vitamins: Secondary | ICD-10-CM | POA: Diagnosis not present

## 2022-12-10 DIAGNOSIS — Z23 Encounter for immunization: Secondary | ICD-10-CM | POA: Diagnosis not present

## 2022-12-10 DIAGNOSIS — R87610 Atypical squamous cells of undetermined significance on cytologic smear of cervix (ASC-US): Secondary | ICD-10-CM | POA: Diagnosis not present

## 2022-12-10 DIAGNOSIS — Z124 Encounter for screening for malignant neoplasm of cervix: Secondary | ICD-10-CM | POA: Diagnosis not present

## 2022-12-10 MED ORDER — CYANOCOBALAMIN 1000 MCG/ML IJ SOLN
1000.0000 ug | Freq: Once | INTRAMUSCULAR | Status: AC
Start: 2022-12-10 — End: 2022-12-10
  Administered 2022-12-10: 1000 ug via INTRAMUSCULAR

## 2022-12-10 NOTE — Progress Notes (Signed)
[facility] [address] [place], [place] [address]  Internal MEDICINE  Office Visit Note  Patient Name: Kristen Reid  [id number]  [id number]  Date of Service: 12/10/2022  Chief Complaint  Patient presents with   Gastroesophageal Reflux   Hypertension   Hyperlipidemia   Follow-up    HPI Kristen Reid presents for a follow-up visit for pap smear, B12 and flu vaccine. Patient presents for a pap smear at 72 years old. Has a family history of cervical cancer and had previously expressed the interest in having 1 more pap smear.  B12 deficiency -- due for B12 injection Flu vaccine -- requesting today      Current Medication: Outpatient Encounter Medications as of 12/10/2022  Medication Sig   anastrozole (ARIMIDEX) 1 MG tablet Take 1 tablet (1 mg total) by mouth daily.   Apple Cider Vinegar 300 MG TABS Take by mouth.   calcium carbonate (OS-CAL) 600 MG TABS tablet Take 600 mg by mouth 2 (two) times daily with a meal.   Cholecalciferol (VITAMIN D3) 2000 units TABS Take by mouth.   clobetasol (TEMOVATE) 0.05 % external solution APPLY TO AFFECTED AREA EVERY DAY AT BEDTIME   Coenzyme Q10 (COQ10) 200 MG CAPS Take by mouth.   dutasteride (AVODART) 0.5 MG capsule TAKE ONE PILL DAILY X 1 WEEK, THEN ONE PILL ONCE WEEKLY   famotidine (PEPCID) 20 MG tablet Take 1 tablet (20 mg total) by mouth daily.   fluticasone (VERAMYST) 27.5 MCG/SPRAY nasal spray Place 2 sprays into the nose daily.   loratadine (CLARITIN) 10 MG tablet Take 10 mg by mouth daily.   MILK THISTLE PO Take by mouth.   VEVYE 0.1 % SOLN Apply 1 drop to eye 2 (two) times daily.   Vit B12-Methionine-Inos-Chol SOLN Inject into the muscle every 30 (thirty) days.   Facility-Administered Encounter Medications as of 12/10/2022  Medication   cyanocobalamin (VITAMIN B12) injection 1,000 mcg   [COMPLETED] cyanocobalamin (VITAMIN B12) injection 1,000 mcg    Surgical History: Past Surgical History:  Procedure  Laterality Date   BASAL CELL CARCINOMA EXCISION  02/03/2021   on nose   BREAST BIOPSY Right 2010 and 04/18/2015   BREAST BIOPSY Right 10/26/2019   x clip path pending mass w/ distortion   BREAST EXCISIONAL BIOPSY Right 08/20/2021   Invasive moderately differentiated ductal adenocarcinoma, grade 2   BREAST LUMPECTOMY WITH RADIOACTIVE SEED LOCALIZATION Right 08/20/2021   Procedure: RIGHT BREAST LUMPECTOMY WITH RADIOACTIVE SEED LOCALIZATION;  Surgeon: Griselda Miner, MD;  Location: [facility];  Service: General;  Laterality: Right;   CATARACT EXTRACTION Right 04/23/2021   CATARACT EXTRACTION Left 05/04/2021   CESAREAN SECTION     corneal saltzman nodules removed Left 03/28/2021   ECTOPIC PREGNANCY SURGERY  1981   2   EYE SURGERY Right    keratectomy    ROBOTIC ASSISTED LAPAROSCOPIC OVARIAN CYSTECTOMY     SENTINEL NODE BIOPSY Right 10/02/2021   Procedure: RIGHT AXILLARY SENTINEL NODE BIOPSY;  Surgeon: Griselda Miner, MD;  Location: [facility];  Service: General;  Laterality: Right;   TONSILECTOMY/ADENOIDECTOMY WITH MYRINGOTOMY  1960    Medical History: Past Medical History:  Diagnosis Date   Arthritis    back hands   Breast cancer (HCC)    Breast mass, right    Cancer (HCC)    Cataract    GERD (gastroesophageal reflux disease)    Hyperlipidemia    Hypertension     Family History: Family History  Problem  Relation Age of Onset   Heart disease Mother    Stroke Father    Skin cancer Father    Diabetes Brother    Diabetes Brother    Cervical cancer Paternal Aunt    Breast cancer Neg Hx     Social History   Socioeconomic History   Marital status: Married    Spouse name: Not on file   Number of children: Not on file   Years of education: Not on file   Highest education level: Not on file  Occupational History   Not on file  Tobacco Use   Smoking status: Never   Smokeless tobacco: Never  Vaping Use   Vaping status: Never Used   Substance and Sexual Activity   Alcohol use: Yes    Comment: occasionally / socially   Drug use: Never   Sexual activity: Yes    Birth control/protection: Post-menopausal  Other Topics Concern   Not on file  Social History Narrative   Not on file   Social Determinants of Health   Financial Resource Strain: Not on file  Food Insecurity: Not on file  Transportation Needs: Not on file  Physical Activity: Not on file  Stress: Not on file  Social Connections: Not on file  Intimate Partner Violence: Not on file      Review of Systems  Constitutional:  Negative for fatigue.  Respiratory: Negative.  Negative for cough, chest tightness, shortness of breath and wheezing.   Cardiovascular: Negative.  Negative for chest pain and palpitations.  Gastrointestinal: Negative.   Genitourinary: Negative.  Negative for dysuria, genital sores, menstrual problem, pelvic pain, vaginal bleeding, vaginal discharge and vaginal pain.  Neurological: Negative.     Vital Signs: BP 130/82   Pulse 92   Temp 98.4 F (36.9 C)   Resp 16   Ht 5' 2.5" (1.588 m)   Wt 150 lb (68 kg)   SpO2 95%   BMI 27.00 kg/m    Physical Exam Constitutional:      General: She is not in acute distress.    Appearance: Normal appearance. She is not ill-appearing.  HENT:     Head: Normocephalic and atraumatic.  Eyes:     Pupils: Pupils are equal, round, and reactive to light.  Cardiovascular:     Rate and Rhythm: Normal rate and regular rhythm.  Pulmonary:     Effort: Pulmonary effort is normal. No respiratory distress.  Abdominal:     Hernia: There is no hernia in the left inguinal area or right inguinal area.  Genitourinary:    General: Normal vulva.     Exam position: Lithotomy position.     Pubic Area: No rash or pubic lice.      Labia:        Right: No rash, tenderness, lesion or injury.        Left: No rash, tenderness, lesion or injury.      Urethra: No prolapse, urethral pain, urethral swelling or  urethral lesion.     Vagina: Normal. No signs of injury and foreign body. No vaginal discharge, erythema, tenderness, bleeding, lesions or prolapsed vaginal walls.     Cervix: Friability and cervical bleeding present. No cervical motion tenderness, discharge, lesion, erythema or eversion.     Uterus: Normal.      Adnexa: Right adnexa normal and left adnexa normal.     Rectum: Normal. No mass, anal fissure or external hemorrhoid. Normal anal tone.  Lymphadenopathy:     Lower Body: No  right inguinal adenopathy. No left inguinal adenopathy.  Neurological:     Mental Status: She is alert and oriented to person, place, and time.  Psychiatric:        Mood and Affect: Mood normal.        Behavior: Behavior normal.        Assessment/Plan: 1. B12 deficiency B12 injection administered in office  - cyanocobalamin (VITAMIN B12) injection 1,000 mcg  2. Encounter for Papanicolaou smear of cervix Pelvic exam performed, with pap smear. Small speculum used due to small vaginal canal and introitus, bleeding with obtaining specimen, otherwise normal exam.  - IGP, Aptima HPV  3. Needs flu shot Flu vaccine administered in office - Influenza, MDCK, trivalent, PF(Flucelvax egg-free)   General Counseling: Fayne Norrie understanding of the findings of todays visit and agrees with plan of treatment. I have discussed any further diagnostic evaluation that may be needed or ordered today. We also reviewed her medications today. she has been encouraged to call the office with any questions or concerns that should arise related to todays visit.    Orders Placed This Encounter  Procedures   Influenza, MDCK, trivalent, PF(Flucelvax egg-free)    Meds ordered this encounter  Medications   cyanocobalamin (VITAMIN B12) injection 1,000 mcg    Return for will call with results, keep normal follow up.   Total time spent:30 Minutes Time spent includes review of chart, medications, test results, and  follow up plan with the patient.   [place] Controlled Substance Database was reviewed by me.  This patient was seen by Sallyanne Kuster, FNP-C in collaboration with Dr. Beverely Risen as a part of collaborative care agreement.   Sharah Finnell R. Tedd Sias, MSN, FNP-C Internal medicine

## 2022-12-16 LAB — IGP, APTIMA HPV: HPV Aptima: NEGATIVE

## 2022-12-23 DIAGNOSIS — D2262 Melanocytic nevi of left upper limb, including shoulder: Secondary | ICD-10-CM | POA: Diagnosis not present

## 2022-12-23 DIAGNOSIS — D2271 Melanocytic nevi of right lower limb, including hip: Secondary | ICD-10-CM | POA: Diagnosis not present

## 2022-12-23 DIAGNOSIS — D2261 Melanocytic nevi of right upper limb, including shoulder: Secondary | ICD-10-CM | POA: Diagnosis not present

## 2022-12-23 DIAGNOSIS — D225 Melanocytic nevi of trunk: Secondary | ICD-10-CM | POA: Diagnosis not present

## 2022-12-23 DIAGNOSIS — L6612 Frontal fibrosing alopecia: Secondary | ICD-10-CM | POA: Diagnosis not present

## 2022-12-23 DIAGNOSIS — T490X5A Adverse effect of local antifungal, anti-infective and anti-inflammatory drugs, initial encounter: Secondary | ICD-10-CM | POA: Diagnosis not present

## 2022-12-23 DIAGNOSIS — L821 Other seborrheic keratosis: Secondary | ICD-10-CM | POA: Diagnosis not present

## 2022-12-23 DIAGNOSIS — D2272 Melanocytic nevi of left lower limb, including hip: Secondary | ICD-10-CM | POA: Diagnosis not present

## 2023-01-01 ENCOUNTER — Encounter: Payer: Self-pay | Admitting: Occupational Therapy

## 2023-01-05 ENCOUNTER — Encounter: Payer: Self-pay | Admitting: Nurse Practitioner

## 2023-01-08 ENCOUNTER — Telehealth: Payer: Self-pay

## 2023-01-08 NOTE — Progress Notes (Signed)
Please let patient know that I am very sorry that I have not delegated someone to call her about her results sooner. It was not intentional by any means.  Yes, she has abnormal results but this type of abnormality is nonspecific and nondiagnostic. We use an algorithm to determine next steps and the result of atypical squamous cells of undetermined significance with a negative HPV test has a recommendation of a 3 year follow up at her age and also based on past results.  No specialist referral or further testing are recommended only surveillance with repeat pap in 3 years.

## 2023-01-08 NOTE — Telephone Encounter (Signed)
Sent pt message through Mychart

## 2023-01-11 ENCOUNTER — Ambulatory Visit (INDEPENDENT_AMBULATORY_CARE_PROVIDER_SITE_OTHER): Payer: Medicare PPO

## 2023-01-11 DIAGNOSIS — E538 Deficiency of other specified B group vitamins: Secondary | ICD-10-CM | POA: Diagnosis not present

## 2023-01-11 MED ORDER — CYANOCOBALAMIN 1000 MCG/ML IJ SOLN
1000.0000 ug | Freq: Once | INTRAMUSCULAR | Status: AC
Start: 2023-01-11 — End: 2023-01-11
  Administered 2023-01-11: 1000 ug via INTRAMUSCULAR

## 2023-01-25 ENCOUNTER — Encounter: Payer: Self-pay | Admitting: Nurse Practitioner

## 2023-01-26 MED ORDER — VALACYCLOVIR HCL 1 G PO TABS: 1000.0000 mg | ORAL_TABLET | Freq: Two times a day (BID) | ORAL | 0 refills | Status: AC

## 2023-02-04 ENCOUNTER — Encounter: Payer: Self-pay | Admitting: Radiation Oncology

## 2023-02-04 ENCOUNTER — Ambulatory Visit
Admission: RE | Admit: 2023-02-04 | Discharge: 2023-02-04 | Disposition: A | Discharge status: Home / Self Care | Payer: Medicare PPO | Source: Ambulatory Visit | Attending: Radiation Oncology | Admitting: Radiation Oncology

## 2023-02-04 VITALS — BP 150/85 | HR 63 | Temp 97.6°F | Resp 14 | Ht 62.5 in | Wt 147.7 lb

## 2023-02-04 DIAGNOSIS — C50211 Malignant neoplasm of upper-inner quadrant of right female breast: Secondary | ICD-10-CM | POA: Insufficient documentation

## 2023-02-04 DIAGNOSIS — Z79811 Long term (current) use of aromatase inhibitors: Secondary | ICD-10-CM | POA: Diagnosis not present

## 2023-02-04 DIAGNOSIS — Z170 Estrogen receptor positive status [ER+]: Secondary | ICD-10-CM | POA: Insufficient documentation

## 2023-02-04 DIAGNOSIS — Z923 Personal history of irradiation: Secondary | ICD-10-CM | POA: Insufficient documentation

## 2023-02-04 DIAGNOSIS — C50911 Malignant neoplasm of unspecified site of right female breast: Secondary | ICD-10-CM | POA: Diagnosis not present

## 2023-02-04 NOTE — Progress Notes (Signed)
Radiation Oncology Follow up Note  Name: Kristen Reid   Date:   02/04/2023 MRN:  [id number] DOB: [birth year]    This 72 y.o. female presents to the clinic today for 33-month follow-up status post whole breast radiation to her right breast for stage Ia ER positive invasive mammary carcinoma.  REFERRING PROVIDER: Lyndon Code, MD  HPI: Patient is a 72 year old female now seen out 13 months having completed whole breast radiation to her right breast for stage Ia ER positive invasive mammary carcinoma.  Seen today in routine follow-up she is doing well.  She specifically denies breast tenderness cough or bone pain..  She is currently on Arimidex tolerating it well without side effect.  She had mammograms back in February which I have reviewed were BI-RADS 2 benign.  COMPLICATIONS OF TREATMENT: none  FOLLOW UP COMPLIANCE: keeps appointments   PHYSICAL EXAM:  BP (!) 150/85 Comment: states that she always has high BP in the physician's office  Pulse 63   Temp 97.6 F (36.4 C)   Resp 14   Ht 5' 2.5" (1.588 m)   Wt 147 lb 11.2 oz (67 kg)   BMI 26.58 kg/m  Lungs are clear to A&P cardiac examination essentially unremarkable with regular rate and rhythm. No dominant mass or nodularity is noted in either breast in 2 positions examined. Incision is well-healed. No axillary or supraclavicular adenopathy is appreciated. Cosmetic result is excellent.  Well-developed well-nourished patient in NAD. HEENT reveals PERLA, EOMI, discs not visualized.  Oral cavity is clear. No oral mucosal lesions are identified. Neck is clear without evidence of cervical or supraclavicular adenopathy. Lungs are clear to A&P. Cardiac examination is essentially unremarkable with regular rate and rhythm without murmur rub or thrill. Abdomen is benign with no organomegaly or masses noted. Motor sensory and DTR levels are equal and symmetric in the upper and lower extremities. Cranial nerves II through XII are grossly intact.  Proprioception is intact. No peripheral adenopathy or edema is identified. No motor or sensory levels are noted. Crude visual fields are within normal range.  RADIOLOGY RESULTS: Mammograms reviewed compatible with above-stated findings she will have repeat mammograms this February which I will review when the become available.  PLAN: Present time patient is doing well now at 13 months with no evidence of disease on pleased with her overall progress.  I have asked to see her back in 1 year for follow-up.  Patient is to call sooner with any concerns she continues follow-up care with medical oncology.  I would like to take this opportunity to thank you for allowing me to participate in the care of your patient.[name], MD

## 2023-02-08 ENCOUNTER — Ambulatory Visit (INDEPENDENT_AMBULATORY_CARE_PROVIDER_SITE_OTHER): Payer: Medicare PPO

## 2023-02-08 DIAGNOSIS — E538 Deficiency of other specified B group vitamins: Secondary | ICD-10-CM

## 2023-02-08 MED ORDER — CYANOCOBALAMIN 1000 MCG/ML IJ SOLN
1000.0000 ug | Freq: Once | INTRAMUSCULAR | Status: AC
  Administered 2023-02-08: 1000 ug via INTRAMUSCULAR

## 2023-03-12 ENCOUNTER — Other Ambulatory Visit: Payer: Self-pay

## 2023-03-12 ENCOUNTER — Encounter: Payer: Self-pay | Admitting: Oncology

## 2023-03-12 DIAGNOSIS — Z170 Estrogen receptor positive status [ER+]: Secondary | ICD-10-CM

## 2023-03-15 ENCOUNTER — Ambulatory Visit (INDEPENDENT_AMBULATORY_CARE_PROVIDER_SITE_OTHER): Payer: Medicare PPO

## 2023-03-15 DIAGNOSIS — E538 Deficiency of other specified B group vitamins: Secondary | ICD-10-CM | POA: Diagnosis not present

## 2023-03-15 MED ORDER — CYANOCOBALAMIN 1000 MCG/ML IJ SOLN
1000.0000 ug | Freq: Once | INTRAMUSCULAR | Status: AC
  Administered 2023-03-15: 1000 ug via INTRAMUSCULAR

## 2023-03-23 ENCOUNTER — Encounter: Payer: Self-pay | Admitting: Oncology

## 2023-03-23 ENCOUNTER — Inpatient Hospital Stay (HOSPITAL_BASED_OUTPATIENT_CLINIC_OR_DEPARTMENT_OTHER): Payer: Medicare PPO | Admitting: Oncology

## 2023-03-23 ENCOUNTER — Inpatient Hospital Stay: Payer: Medicare PPO | Attending: Oncology

## 2023-03-23 VITALS — BP 136/77 | HR 58 | Temp 97.2°F | Resp 18 | Wt 146.8 lb

## 2023-03-23 DIAGNOSIS — C50411 Malignant neoplasm of upper-outer quadrant of right female breast: Secondary | ICD-10-CM | POA: Diagnosis not present

## 2023-03-23 DIAGNOSIS — Z79811 Long term (current) use of aromatase inhibitors: Secondary | ICD-10-CM | POA: Diagnosis not present

## 2023-03-23 DIAGNOSIS — M85859 Other specified disorders of bone density and structure, unspecified thigh: Secondary | ICD-10-CM | POA: Insufficient documentation

## 2023-03-23 DIAGNOSIS — M8580 Other specified disorders of bone density and structure, unspecified site: Secondary | ICD-10-CM | POA: Diagnosis not present

## 2023-03-23 DIAGNOSIS — Z170 Estrogen receptor positive status [ER+]: Secondary | ICD-10-CM

## 2023-03-23 LAB — CMP (CANCER CENTER ONLY)
ALT: 20 U/L (ref 0–44)
AST: 22 U/L (ref 15–41)
Albumin: 4.3 g/dL (ref 3.5–5.0)
Alkaline Phosphatase: 54 U/L (ref 38–126)
Anion gap: 9 (ref 5–15)
BUN: 17 mg/dL (ref 8–23)
CO2: 28 mmol/L (ref 22–32)
Calcium: 9.8 mg/dL (ref 8.9–10.3)
Chloride: 102 mmol/L (ref 98–111)
Creatinine: 0.52 mg/dL (ref 0.44–1.00)
GFR, Estimated: >60 mL/min (ref >60)
Glucose, Bld: 111 mg/dL — ABNORMAL HIGH (ref 70–99)
Potassium: 3.8 mmol/L (ref 3.5–5.1)
Sodium: 139 mmol/L (ref 135–145)
Total Bilirubin: 1.1 mg/dL (ref 0.0–1.2)
Total Protein: 7.4 g/dL (ref 6.5–8.1)

## 2023-03-23 LAB — CBC WITH DIFFERENTIAL (CANCER CENTER ONLY)
Abs Immature Granulocytes: 0.01 10*3/uL (ref 0.00–0.07)
Basophils Absolute: 0 10*3/uL (ref 0.0–0.1)
Basophils Relative: 1 %
Eosinophils Absolute: 0.1 10*3/uL (ref 0.0–0.5)
Eosinophils Relative: 1 %
HCT: 40.3 % (ref 36.0–46.0)
Hemoglobin: 13.9 g/dL (ref 12.0–15.0)
Immature Granulocytes: 0 %
Lymphocytes Relative: 39 %
Lymphs Abs: 1.7 10*3/uL (ref 0.7–4.0)
MCH: 31.8 pg (ref 26.0–34.0)
MCHC: 34.5 g/dL (ref 30.0–36.0)
MCV: 92.2 fL (ref 80.0–100.0)
Monocytes Absolute: 0.3 10*3/uL (ref 0.1–1.0)
Monocytes Relative: 7 %
Neutro Abs: 2.4 10*3/uL (ref 1.7–7.7)
Neutrophils Relative %: 52 %
Platelet Count: 199 10*3/uL (ref 150–400)
RBC: 4.37 MIL/uL (ref 3.87–5.11)
RDW: 11.9 % (ref 11.5–15.5)
WBC Count: 4.5 10*3/uL (ref 4.0–10.5)
nRBC: 0 % (ref 0.0–0.2)

## 2023-03-23 MED ORDER — ANASTROZOLE 1 MG PO TABS: 1.0000 mg | ORAL_TABLET | Freq: Every day | ORAL | 1 refills | Status: DC

## 2023-03-23 NOTE — Assessment & Plan Note (Addendum)
Right upper breast invasive carcinoma plus DCIS, ER/PR positive, HER2 negative.  Ki-67 20%. Axillary LN negative.  pT1c pN0 Oncotype DX 18, no adjuvant chemotherapy.  S/p Adjuvant Radiation.  Continue -Arimidex 1mg  daily, Refills sent to pharmacy. Duration 5 years, till Oct 2028 Continue annual mammogram, Fe 2025

## 2023-03-23 NOTE — Assessment & Plan Note (Addendum)
Continue calcium and vitamin D supplementation.  Sept 2024  DEXA osteopenia, FRAX major hip pathological fracture 16%- I discussed with patient about bone strengthen treatments, ie Zometa or Prolia Q6 months.  Rationale and side effects were reviewed with patient. Patient is undecided and will update me in the future if she agrees with treatment.

## 2023-03-23 NOTE — Progress Notes (Signed)
Hematology/Oncology Progress note [phone] [phone]            Patient Care Team: Sallyanne Kuster, NP as PCP - General (Nurse Practitioner) Hulen Luster, RN as Oncology Nurse Navigator [name], MD as Consulting Physician (Radiation Oncology) Rickard Patience, MD as Consulting Physician (Oncology) Griselda Miner, MD as Consulting Physician (General Surgery)  ASSESSMENT & PLAN:   Cancer Staging  Malignant neoplasm of upper-outer quadrant of right breast in female, estrogen receptor positive [facility]) Staging form: Breast, AJCC 8th Edition - Pathologic stage from 10/02/2021: Stage IA (pT1c, pN0, cM0, G2, ER+, PR+, HER2-, Oncotype DX score: 18) - Signed by Rickard Patience, MD on 12/22/2021   Malignant neoplasm of upper-outer quadrant of right breast in female, estrogen receptor positive (HCC) Right upper breast invasive carcinoma plus DCIS, ER/PR positive, HER2 negative.  Ki-67 20%. Axillary LN negative.  pT1c pN0 Oncotype DX 18, no adjuvant chemotherapy.  S/p Adjuvant Radiation.  Continue -Arimidex 1mg  daily, Refills sent to pharmacy. Duration 5 years, till Oct 2028 Continue annual mammogram, Fe 2025  Osteopenia Continue calcium and vitamin D supplementation.  Sept 2024  DEXA osteopenia, FRAX major hip pathological fracture 16%- I discussed with patient about bone strengthen treatments, ie Zometa or Prolia Q6 months.  Rationale and side effects were reviewed with patient. Patient is undecided and will update me in the future if she agrees with treatment.    Orders Placed This Encounter  Procedures   CBC with Differential (Cancer Center Only)    Standing Status:   Future    Expected Date:   09/20/2023    Expiration Date:   03/22/2024   CMP (Cancer Center only)    Standing Status:   Future    Expected Date:   09/20/2023    Expiration Date:   03/22/2024   Follow up in 6 months All questions were answered. The patient knows to call the clinic with any problems,  questions or concerns. Rickard Patience, MD, PhD [facility] Hematology Oncology 03/23/2023    CHIEF COMPLAINTS/REASON FOR VISIT:  Follow up for Stage I breast cancer  HISTORY OF PRESENTING ILLNESS:   Kristen Reid is a  73 y.o.  female presents for follow up of  right breast cancer Oncology history listed as below Oncology History  Malignant neoplasm of upper-outer quadrant of right breast in female, estrogen receptor positive (HCC)  10/10/2019 Mammogram   Unilateral breast mammogram showed partially obscured mass with associated architectural distortion within the upper slightly inner right breast.  Without definitive sonographic correlates.  Stereotactic biopsy was recommended.   10/03/2020 Imaging   1. Stable appearance of RIGHT breast architectural distortion which demonstrated fibrosis and fibrocystic change. X clip is in appropriate position. Recommend surgical consultation for consideration of excision versus close monitoring. 2. No mammographic evidence of malignancy in the LEFT breast   05/02/2021 Mammogram   Unilateral diagnostic mammogram right 1. Possible slight increase in size of the distorted mass at the site of the X shaped biopsy marking clip in the upper-inner right breast. 2.  No evidence of right axillary lymphadenopathy.   09/12/2021 Initial Diagnosis   Malignant neoplasm of upper-outer quadrant of right breast in female, estrogen receptor positive (HCC)  -10/06/2019 right breast superior stereotactic core needle biopsy showed benign mammary parenchyma with stromal fibrosis, fibrocystic changes, and focal usual ductal hyperplasia.  Negative for atypical proliferative breast disease. -08/20/2021, right breast lumpectomy showed invasive moderately differentiated ductal adenocarcinoma, grade 2, associated with DCIS in situ, intermediate  nuclear grade, solid type with focal necrosis.  Margins free (invasive tumor 1 mm from superior margin; DCIS 1 mm from superior margin) pT1c  pNx    10/02/2021 Cancer Staging   Staging form: Breast, AJCC 8th Edition - Pathologic stage from 10/02/2021: Stage IA (pT1c, pN0, cM0, G2, ER+, PR+, HER2-, Oncotype DX score: 18) - Signed by Rickard Patience, MD on 12/22/2021 Stage prefix: Initial diagnosis Multigene prognostic tests performed: Oncotype DX Recurrence score range: Greater than or equal to 11 Histologic grading system: 3 grade system   10/02/2021 Surgery   Patient underwent sentinel lymph node biopsy.  All 4 lymph nodes are negative.   10/06/2021 Oncotype testing   Oncotype DX recurrence score 18, distant recurrence risk at 9 years 5%, group average absolute chemotherapy benefit less than 1%.   11/27/2021 - 12/08/2021 Radiation Therapy   Adjuvant right breast radiation.    12/22/2021 -  Anti-estrogen oral therapy   Started on Arimidex 1mg  daily.     Menarche 73 years of age Menopause in 68s. Very short period of time of hormone replacement therapy, less than 1 year Previous OCP for couple of years. Denies any family history of breast cancer.  Great aunt was diagnosed with uterine cancer.  No other cancer family history.  INTERVAL HISTORY Kristen Reid is a 73 y.o. female who has above history reviewed by me today presents for follow up visit for Stage IA right breast cancer. She tolerates Arimidex with manageable side effects, joint pain.    Review of Systems  Constitutional:  Negative for appetite change, chills, fatigue and fever.  HENT:   Negative for hearing loss and voice change.   Eyes:  Negative for eye problems.  Respiratory:  Negative for chest tightness and cough.   Cardiovascular:  Negative for chest pain.  Gastrointestinal:  Negative for abdominal distention, abdominal pain and blood in stool.  Endocrine: Negative for hot flashes.  Genitourinary:  Negative for difficulty urinating and frequency.   Musculoskeletal:  Positive for arthralgias.  Skin:  Negative for itching and rash.  Neurological:  Negative  for extremity weakness.  Hematological:  Negative for adenopathy.  Psychiatric/Behavioral:  Negative for confusion.     MEDICAL HISTORY:  Past Medical History:  Diagnosis Date   Arthritis    back hands   Breast cancer (HCC)    Breast mass, right    Cancer (HCC)    Cataract    GERD (gastroesophageal reflux disease)    Hyperlipidemia    Hypertension     SURGICAL HISTORY: Past Surgical History:  Procedure Laterality Date   BASAL CELL CARCINOMA EXCISION  02/03/2021   on nose   BREAST BIOPSY Right 2010 and 04/18/2015   BREAST BIOPSY Right 10/26/2019   x clip path pending mass w/ distortion   BREAST EXCISIONAL BIOPSY Right 08/20/2021   Invasive moderately differentiated ductal adenocarcinoma, grade 2   BREAST LUMPECTOMY WITH RADIOACTIVE SEED LOCALIZATION Right 08/20/2021   Procedure: RIGHT BREAST LUMPECTOMY WITH RADIOACTIVE SEED LOCALIZATION;  Surgeon: Griselda Miner, MD;  Location: [facility];  Service: General;  Laterality: Right;   CATARACT EXTRACTION Right 04/23/2021   CATARACT EXTRACTION Left 05/04/2021   CESAREAN SECTION     corneal saltzman nodules removed Left 03/28/2021   ECTOPIC PREGNANCY SURGERY  1981   2   EYE SURGERY Right    keratectomy    ROBOTIC ASSISTED LAPAROSCOPIC OVARIAN CYSTECTOMY     SENTINEL NODE BIOPSY Right 10/02/2021   Procedure: RIGHT AXILLARY SENTINEL NODE  BIOPSY;  Surgeon: Griselda Miner, MD;  Location: [facility];  Service: General;  Laterality: Right;   TONSILECTOMY/ADENOIDECTOMY WITH MYRINGOTOMY  1960    SOCIAL HISTORY: Social History   Socioeconomic History   Marital status: Married    Spouse name: Not on file   Number of children: Not on file   Years of education: Not on file   Highest education level: Not on file  Occupational History   Not on file  Tobacco Use   Smoking status: Never   Smokeless tobacco: Never  Vaping Use   Vaping status: Never Used  Substance and Sexual Activity   Alcohol use:  Yes    Comment: occasionally / socially   Drug use: Never   Sexual activity: Yes    Birth control/protection: Post-menopausal  Other Topics Concern   Not on file  Social History Narrative   Not on file   Social Drivers of Health   Financial Resource Strain: Not on file  Food Insecurity: Not on file  Transportation Needs: Not on file  Physical Activity: Not on file  Stress: Not on file  Social Connections: Not on file  Intimate Partner Violence: Not on file    FAMILY HISTORY: Family History  Problem Relation Age of Onset   Heart disease Mother    Stroke Father    Skin cancer Father    Diabetes Brother    Diabetes Brother    Cervical cancer Paternal Aunt    Breast cancer Neg Hx     ALLERGIES:  has no known allergies.  MEDICATIONS:  Current Outpatient Medications  Medication Sig Dispense Refill   calcium carbonate (OS-CAL) 600 MG TABS tablet Take 600 mg by mouth 2 (two) times daily with a meal.     Cholecalciferol (VITAMIN D3) 2000 units TABS Take by mouth.     clobetasol (TEMOVATE) 0.05 % external solution APPLY TO AFFECTED AREA EVERY DAY AT BEDTIME     Coenzyme Q10 (COQ10) 200 MG CAPS Take by mouth.     dutasteride (AVODART) 0.5 MG capsule TAKE ONE PILL DAILY X 1 WEEK, THEN ONE PILL ONCE WEEKLY     famotidine (PEPCID) 20 MG tablet Take 1 tablet (20 mg total) by mouth daily. 90 tablet 3   fluorometholone (FML) 0.1 % ophthalmic suspension      fluticasone (FLONASE) 50 MCG/ACT nasal spray Place 2 sprays into both nostrils daily.     fluticasone (VERAMYST) 27.5 MCG/SPRAY nasal spray Place 2 sprays into the nose daily.     loratadine (CLARITIN) 10 MG tablet Take 10 mg by mouth daily.     MILK THISTLE PO Take by mouth.     prednisoLONE acetate (PRED FORTE) 1 % ophthalmic suspension PLEASE SEE ATTACHED FOR DETAILED DIRECTIONS     VEVYE 0.1 % SOLN Apply 1 drop to eye 2 (two) times daily.     Vit B12-Methionine-Inos-Chol SOLN Inject into the muscle every 30 (thirty) days.      anastrozole (ARIMIDEX) 1 MG tablet Take 1 tablet (1 mg total) by mouth daily. 90 tablet 1   Current Facility-Administered Medications  Medication Dose Route Frequency Provider Last Rate Last Admin   cyanocobalamin (VITAMIN B12) injection 1,000 mcg  1,000 mcg Intramuscular Once Abernathy, Alyssa, NP         PHYSICAL EXAMINATION: ECOG PERFORMANCE STATUS: 1 - Symptomatic but completely ambulatory Vitals:   03/23/23 1314 03/23/23 1321  BP: (!) 157/81 136/77  Pulse: (!) 58   Resp: 18   Temp: (!)  97.2 F (36.2 C)   SpO2: 99%    Filed Weights   03/23/23 1314  Weight: 146 lb 12.8 oz (66.6 kg)    Physical Exam Constitutional:      General: She is not in acute distress. HENT:     Head: Normocephalic and atraumatic.  Eyes:     General: No scleral icterus. Cardiovascular:     Rate and Rhythm: Normal rate and regular rhythm.  Pulmonary:     Effort: Pulmonary effort is normal. No respiratory distress.     Breath sounds: No wheezing.  Abdominal:     General: Bowel sounds are normal. There is no distension.     Palpations: Abdomen is soft.  Musculoskeletal:        General: No deformity. Normal range of motion.     Cervical back: Normal range of motion and neck supple.  Skin:    General: Skin is warm.     Findings: No erythema or rash.  Neurological:     Mental Status: She is alert and oriented to person, place, and time. Mental status is at baseline.  Psychiatric:        Mood and Affect: Mood normal.      LABORATORY DATA:  I have reviewed the data as listed    Latest Ref Rng & Units 03/23/2023    1:05 PM 10/07/2022    8:43 AM 09/22/2022    1:27 PM  CBC  WBC 4.0 - 10.5 K/uL 4.5  3.4  4.6   Hemoglobin 12.0 - 15.0 g/dL 16.1  09.6  04.5   Hematocrit 36.0 - 46.0 % 40.3  42.6  43.2   Platelets 150 - 400 K/uL 199  230  204       Latest Ref Rng & Units 03/23/2023    1:05 PM 10/07/2022    8:43 AM 09/22/2022    1:27 PM  CMP  Glucose 70 - 99 mg/dL 409  94  811   BUN 8 - 23  mg/dL 17  11  13    Creatinine 0.44 - 1.00 mg/dL 9.14  7.82  9.56   Sodium 135 - 145 mmol/L 139  141  137   Potassium 3.5 - 5.1 mmol/L 3.8  4.4  3.5   Chloride 98 - 111 mmol/L 102  103  101   CO2 22 - 32 mmol/L 28  27  27    Calcium 8.9 - 10.3 mg/dL 9.8  9.7  9.8   Total Protein 6.5 - 8.1 g/dL 7.4  6.5  7.5   Total Bilirubin 0.0 - 1.2 mg/dL 1.1  0.8  0.7   Alkaline Phos 38 - 126 U/L 54  64  69   AST 15 - 41 U/L 22  31  44   ALT 0 - 44 U/L 20  26  42      RADIOGRAPHIC STUDIES: I have personally reviewed the radiological images as listed and agreed with the findings in the report. No results found.

## 2023-04-06 DIAGNOSIS — Z170 Estrogen receptor positive status [ER+]: Secondary | ICD-10-CM | POA: Diagnosis not present

## 2023-04-06 DIAGNOSIS — C50411 Malignant neoplasm of upper-outer quadrant of right female breast: Secondary | ICD-10-CM | POA: Diagnosis not present

## 2023-04-09 ENCOUNTER — Ambulatory Visit: Payer: Medicare PPO

## 2023-04-12 ENCOUNTER — Ambulatory Visit: Payer: Medicare PPO

## 2023-04-16 ENCOUNTER — Ambulatory Visit
Admission: RE | Admit: 2023-04-16 | Discharge: 2023-04-16 | Disposition: A | Discharge status: Home / Self Care | Payer: Medicare PPO | Source: Ambulatory Visit | Attending: Oncology | Admitting: Oncology

## 2023-04-16 DIAGNOSIS — Z170 Estrogen receptor positive status [ER+]: Secondary | ICD-10-CM | POA: Diagnosis not present

## 2023-04-16 DIAGNOSIS — R92333 Mammographic heterogeneous density, bilateral breasts: Secondary | ICD-10-CM | POA: Diagnosis not present

## 2023-04-16 DIAGNOSIS — C50411 Malignant neoplasm of upper-outer quadrant of right female breast: Secondary | ICD-10-CM | POA: Insufficient documentation

## 2023-04-16 HISTORY — DX: Personal history of irradiation: Z92.3

## 2023-04-19 ENCOUNTER — Ambulatory Visit (INDEPENDENT_AMBULATORY_CARE_PROVIDER_SITE_OTHER): Payer: Medicare PPO

## 2023-04-19 DIAGNOSIS — E538 Deficiency of other specified B group vitamins: Secondary | ICD-10-CM | POA: Diagnosis not present

## 2023-04-19 MED ORDER — CYANOCOBALAMIN 1000 MCG/ML IJ SOLN
1000.0000 ug | Freq: Once | INTRAMUSCULAR | Status: AC
  Administered 2023-04-19: 1000 ug via INTRAMUSCULAR

## 2023-04-22 ENCOUNTER — Ambulatory Visit: Payer: Medicare PPO | Admitting: Occupational Therapy

## 2023-04-23 ENCOUNTER — Ambulatory Visit: Payer: Medicare PPO | Attending: Nurse Practitioner | Admitting: Occupational Therapy

## 2023-04-23 DIAGNOSIS — L905 Scar conditions and fibrosis of skin: Secondary | ICD-10-CM | POA: Insufficient documentation

## 2023-04-23 DIAGNOSIS — I890 Lymphedema, not elsewhere classified: Secondary | ICD-10-CM | POA: Insufficient documentation

## 2023-04-23 NOTE — Therapy (Signed)
 [facility] [address] [place], [address] Phone: [phone]   Fax:  [phone]  Occupational Therapy Screen  Patient Details  Name: Kristen Reid MRN: [id number] Date of Birth: October 28, 1950 No data recorded  Encounter Date: 04/23/2023   OT End of Session - 04/23/23 1122     Visit Number 0             Past Medical History:  Diagnosis Date   Arthritis    back hands   Breast cancer (HCC) 07/2021   Invasive carcinoma with DCIS   Breast mass, right    Cancer (HCC)    Cataract    GERD (gastroesophageal reflux disease)    Hyperlipidemia    Hypertension    Personal history of radiation therapy     Past Surgical History:  Procedure Laterality Date   BASAL CELL CARCINOMA EXCISION  02/03/2021   on nose   BREAST BIOPSY Right 2010 and 04/18/2015   BREAST BIOPSY Right 10/26/2019   x marker, PASH no atypia, excision recommended, not done till 6/23   BREAST EXCISIONAL BIOPSY Right 08/20/2021   Invasive moderately differentiated ductal adenocarcinoma, grade 2   BREAST LUMPECTOMY WITH RADIOACTIVE SEED LOCALIZATION Right 08/20/2021   Procedure: RIGHT BREAST LUMPECTOMY WITH RADIOACTIVE SEED LOCALIZATION;  Surgeon: Griselda Miner, MD;  Location: [facility];  Service: General;  Laterality: Right;   CATARACT EXTRACTION Right 04/23/2021   CATARACT EXTRACTION Left 05/04/2021   CESAREAN SECTION     corneal saltzman nodules removed Left 03/28/2021   ECTOPIC PREGNANCY SURGERY  1981   2   EYE SURGERY Right    keratectomy    ROBOTIC ASSISTED LAPAROSCOPIC OVARIAN CYSTECTOMY     SENTINEL NODE BIOPSY Right 10/02/2021   Procedure: RIGHT AXILLARY SENTINEL NODE BIOPSY;  Surgeon: Griselda Miner, MD;  Location: [facility];  Service: General;  Laterality: Right;   TONSILECTOMY/ADENOIDECTOMY WITH MYRINGOTOMY  1960    There were no vitals filed for this visit.   Subjective Assessment -  04/23/23 1119     Subjective  I seen the surgeon the other day.  Wanted to just check in with you about it  if I still need to wear the Jovi pak daily.  Until December I wore it every night-at times I can still feel like some swelling under my arm  or in my L breast.  But much better.  My arm is fine.    Pertinent History 10/28/21 seen by surgeon and refer to therapy - Pt with diagnosis right breast cancer. The patient is a 73 year old white female who is about 2 1/2 months status post right breast lumpectomy and sentinel node biopsy for a T1CN0 right breast cancer that was ER and PR positive and HER2 negative with a Ki-67 of 30%. Her margins are clean and her nodes are negative. She does report some feeling of a rope on the underside of the right breast. She also reports some bruising and some swelling in the right axilla. and d She denies any fevers or chills. There is some discoloration of the central portion of the right breast secondary to injection of the tracer. There appears to be some cording on the underside of the right breast. There also appears to be some mild swelling in the right axilla but this does not seem large enough to aspirate. There is some slight discoloration of the skin medial to this. Did prescribe antibiotic- pt started radiation  11/04/21    Patient Stated Goals Wanted just to make sure I know what to do.                 LYMPHEDEMA/ONCOLOGY QUESTIONNAIRE - 04/23/23 0001       Right Upper Extremity Lymphedema   15 cm Proximal to Olecranon Process 29 cm    10 cm Proximal to Olecranon Process 25.5 cm    Olecranon Process 22.5 cm      Left Upper Extremity Lymphedema   15 cm Proximal to Olecranon Process 28.5 cm    10 cm Proximal to Olecranon Process 25.5 cm    Olecranon Process 23 cm               03/31/23 OT last note: Pt return to OT after seen 5 wks ago  Pt report and show increase tightness and stiffness still in R shoulder and axilla  Review with pt  AAROM for shoulder on wall for flexion and ABD  And then isometric into wall with scapula retraction -  10 reps  In shower - for better compliance  Pt can follow up with me in 6-8 wks if needed    Circumference in bilateral UE - WNL - no signs or symptoms in bilateral UE   Patient do report right breast is larger with nipple higher than the left but great improvement since 3 sessions ago with pt doing soft tissue mobs, MLD across breast from R axilla to L breast 20 reps. Pt  wore Komprex foam  under bra or camisole- to decrease swelling and tightness - nipple closer to same height than L. But at this time would recommend for pt to look into getting jovipak unilateral postmastectomy pad to use night time  Pt report some evenings she feels breast being more heavy or full.    Patient did finish radiation in October.      OT SCREEN 04/23/23: Patient return after seen a year ago.  Patient has been wearing her Jovi pack breast pad every night.  Up to about December.  Patient did fly to [place] since then. Patient reported full feeling in the breast or under the arm still at times but not every day. Patient circumference in bilateral upper extremity within normal limits compared to each other. Would recommend for her over-the-counter compression sleeve and glove when flying.  To be preventative Patient at this time can continue with the Jovi pack as needed if doing high risk activities or overuse and feeling congestion the evening. Wear it to prevent congestion in the right upper extremity Patient was provided with a handout on signs and symptoms as well as precautions with lymphedema. Reviewed with patient.                          Visit Diagnosis: Scar tissue  Lymphedema, not elsewhere classified    Problem List Patient Active Problem List   Diagnosis Date Noted   Osteopenia 03/24/2022   Goals of care, counseling/discussion 09/22/2021   Malignant neoplasm of  upper-outer quadrant of right breast in female, estrogen receptor positive (HCC) 09/12/2021   Abnormal mammogram of left breast 12/10/2020    Oletta Cohn, OTR/L,CLT 04/23/2023, 11:25 AM  [facility] [address] [place], [address] Phone: [phone]   Fax:  [phone]  Name: MARIAME RYBOLT MRN: [id number] Date of Birth: [birth year]

## 2023-05-04 DIAGNOSIS — J309 Allergic rhinitis, unspecified: Secondary | ICD-10-CM | POA: Diagnosis not present

## 2023-05-17 ENCOUNTER — Ambulatory Visit (INDEPENDENT_AMBULATORY_CARE_PROVIDER_SITE_OTHER): Payer: Medicare PPO

## 2023-05-17 DIAGNOSIS — E538 Deficiency of other specified B group vitamins: Secondary | ICD-10-CM | POA: Diagnosis not present

## 2023-05-17 MED ORDER — CYANOCOBALAMIN 1000 MCG/ML IJ SOLN
1000.0000 ug | Freq: Once | INTRAMUSCULAR | Status: AC
  Administered 2023-05-17: 1000 ug via INTRAMUSCULAR

## 2023-06-14 ENCOUNTER — Ambulatory Visit (INDEPENDENT_AMBULATORY_CARE_PROVIDER_SITE_OTHER)

## 2023-06-14 DIAGNOSIS — E538 Deficiency of other specified B group vitamins: Secondary | ICD-10-CM | POA: Diagnosis not present

## 2023-06-14 MED ORDER — CYANOCOBALAMIN 1000 MCG/ML IJ SOLN
1000.0000 ug | Freq: Once | INTRAMUSCULAR | Status: AC
  Administered 2023-06-14: 1000 ug via INTRAMUSCULAR

## 2023-07-12 ENCOUNTER — Ambulatory Visit (INDEPENDENT_AMBULATORY_CARE_PROVIDER_SITE_OTHER)

## 2023-07-12 DIAGNOSIS — E538 Deficiency of other specified B group vitamins: Secondary | ICD-10-CM

## 2023-07-12 MED ORDER — CYANOCOBALAMIN 1000 MCG/ML IJ SOLN
1000.0000 ug | Freq: Once | INTRAMUSCULAR | Status: AC
  Administered 2023-07-12: 1000 ug via INTRAMUSCULAR

## 2023-07-20 DIAGNOSIS — H35033 Hypertensive retinopathy, bilateral: Secondary | ICD-10-CM | POA: Diagnosis not present

## 2023-07-20 DIAGNOSIS — H0288A Meibomian gland dysfunction right eye, upper and lower eyelids: Secondary | ICD-10-CM | POA: Diagnosis not present

## 2023-07-20 DIAGNOSIS — H524 Presbyopia: Secondary | ICD-10-CM | POA: Diagnosis not present

## 2023-07-20 DIAGNOSIS — H16223 Keratoconjunctivitis sicca, not specified as Sjogren's, bilateral: Secondary | ICD-10-CM | POA: Diagnosis not present

## 2023-07-20 DIAGNOSIS — H18453 Nodular corneal degeneration, bilateral: Secondary | ICD-10-CM | POA: Diagnosis not present

## 2023-07-20 DIAGNOSIS — Z961 Presence of intraocular lens: Secondary | ICD-10-CM | POA: Diagnosis not present

## 2023-07-20 DIAGNOSIS — L718 Other rosacea: Secondary | ICD-10-CM | POA: Diagnosis not present

## 2023-07-20 DIAGNOSIS — I10 Essential (primary) hypertension: Secondary | ICD-10-CM | POA: Diagnosis not present

## 2023-07-20 DIAGNOSIS — H0288B Meibomian gland dysfunction left eye, upper and lower eyelids: Secondary | ICD-10-CM | POA: Diagnosis not present

## 2023-08-16 ENCOUNTER — Ambulatory Visit (INDEPENDENT_AMBULATORY_CARE_PROVIDER_SITE_OTHER)

## 2023-08-16 DIAGNOSIS — E538 Deficiency of other specified B group vitamins: Secondary | ICD-10-CM

## 2023-08-16 MED ORDER — CYANOCOBALAMIN 1000 MCG/ML IJ SOLN
1000.0000 ug | Freq: Once | INTRAMUSCULAR | Status: AC
  Administered 2023-08-16: 1000 ug via INTRAMUSCULAR

## 2023-09-13 ENCOUNTER — Ambulatory Visit (INDEPENDENT_AMBULATORY_CARE_PROVIDER_SITE_OTHER)

## 2023-09-13 DIAGNOSIS — E538 Deficiency of other specified B group vitamins: Secondary | ICD-10-CM | POA: Diagnosis not present

## 2023-09-13 MED ORDER — CYANOCOBALAMIN 1000 MCG/ML IJ SOLN
1000.0000 ug | Freq: Once | INTRAMUSCULAR | Status: AC
  Administered 2023-09-13: 1000 ug via INTRAMUSCULAR

## 2023-09-21 ENCOUNTER — Other Ambulatory Visit: Payer: Medicare PPO

## 2023-09-21 ENCOUNTER — Ambulatory Visit: Payer: Medicare PPO | Admitting: Oncology

## 2023-10-14 ENCOUNTER — Ambulatory Visit: Payer: Medicare PPO | Admitting: Nurse Practitioner

## 2023-10-14 ENCOUNTER — Encounter: Payer: Self-pay | Admitting: Nurse Practitioner

## 2023-10-14 VITALS — BP 138/86 | HR 65 | Temp 98.3°F | Resp 16 | Ht 62.5 in | Wt 145.0 lb

## 2023-10-14 DIAGNOSIS — Z0000 Encounter for general adult medical examination without abnormal findings: Secondary | ICD-10-CM

## 2023-10-14 DIAGNOSIS — E538 Deficiency of other specified B group vitamins: Secondary | ICD-10-CM | POA: Diagnosis not present

## 2023-10-14 DIAGNOSIS — E782 Mixed hyperlipidemia: Secondary | ICD-10-CM

## 2023-10-14 DIAGNOSIS — M8580 Other specified disorders of bone density and structure, unspecified site: Secondary | ICD-10-CM | POA: Diagnosis not present

## 2023-10-14 MED ORDER — CYANOCOBALAMIN 1000 MCG/ML IJ SOLN
1000.0000 ug | Freq: Once | INTRAMUSCULAR | Status: AC
  Administered 2023-10-14: 1000 ug via INTRAMUSCULAR

## 2023-10-14 NOTE — Progress Notes (Signed)
 [facility] [address] [place], [place] [address]  Internal MEDICINE  Office Visit Note  Patient Name: Kristen Reid  [id number]  [id number]  Date of Service: 10/14/2023  Chief Complaint  Patient presents with   Hypertension   Gastroesophageal Reflux   Hyperlipidemia   Medicare Wellness    HPI Enes presents for an annual well visit and physical exam.  Well-appearing 73 y.o. female with GERD, high cholesterol and  recent history of breast cancer and is following up with oncology regularly. She is currently taking anastrozole  which is managed by oncology.  Routine CRC screening: due in 2031 Routine mammogram: due in February  DEXA scan: done last year  Pap smear: family history of cervical cancer in aunt at age 85, repeat pap due in October this year.  Labs: due for routine labs.  New or worsening pain: increased soreness of joints, esp lower back Other concerns:      10/14/2023    8:56 AM 10/09/2022    8:45 AM 09/24/2021   11:18 AM  MMSE - Mini Mental State Exam  Orientation to time 5 5 5   Orientation to Place 5 5 5   Registration 3 3 3   Attention/ Calculation 5 5 5   Recall 3 3 3   Language- name 2 objects 2 2 2   Language- repeat 1 1 1   Language- follow 3 step command 3 3 3   Language- read & follow direction 1 1 1   Write a sentence 1 1 1   Copy design 1 1 1   Total score 30 30 30     Functional Status Survey: Is the patient deaf or have difficulty hearing?: No Does the patient have difficulty seeing, even when wearing glasses/contacts?: No Does the patient have difficulty concentrating, remembering, or making decisions?: No Does the patient have difficulty walking or climbing stairs?: No Does the patient have difficulty dressing or bathing?: No Does the patient have difficulty doing errands alone such as visiting a doctor's office or shopping?: No     10/20/2021    1:39 PM 01/16/2022   10:58 AM 01/19/2022    1:31 PM 10/09/2022    8:44 AM  10/14/2023    8:55 AM  Fall Risk  Falls in the past year?  0  0 0  Was there an injury with Fall?  0  0 0  Fall Risk Category Calculator  0  0 0  Fall Risk Category (Retired)  Low      (RETIRED) Patient Fall Risk Level Low fall risk  Low fall risk  Low fall risk     Patient at Risk for Falls Due to  No Fall Risks  No Fall Risks No Fall Risks  Fall risk Follow up  Falls evaluation completed   Falls evaluation completed Falls evaluation completed     Data saved with a previous flowsheet row definition       10/14/2023    8:55 AM  Depression screen PHQ 2/9  Decreased Interest 0  Down, Depressed, Hopeless 0  PHQ - 2 Score 0       Current Medication: Outpatient Encounter Medications as of 10/14/2023  Medication Sig   anastrozole  (ARIMIDEX ) 1 MG tablet Take 1 tablet (1 mg total) by mouth daily.   calcium  carbonate (OS-CAL) 600 MG TABS tablet Take 600 mg by mouth 2 (two) times daily with a meal.   Cholecalciferol (VITAMIN D3) 2000 units TABS Take by mouth.   Coenzyme Q10 (COQ10) 200 MG CAPS Take by mouth.  dutasteride (AVODART) 0.5 MG capsule TAKE ONE PILL DAILY X 1 WEEK, THEN ONE PILL ONCE WEEKLY   famotidine  (PEPCID ) 20 MG tablet Take 1 tablet (20 mg total) by mouth daily.   fluorometholone (FML) 0.1 % ophthalmic suspension    fluticasone (FLONASE) 50 MCG/ACT nasal spray Place 2 sprays into both nostrils daily.   fluticasone (VERAMYST) 27.5 MCG/SPRAY nasal spray Place 2 sprays into the nose daily.   loratadine (CLARITIN) 10 MG tablet Take 10 mg by mouth daily.   prednisoLONE acetate (PRED FORTE) 1 % ophthalmic suspension PLEASE SEE ATTACHED FOR DETAILED DIRECTIONS   VEVYE 0.1 % SOLN Apply 1 drop to eye 2 (two) times daily.   Vit B12-Methionine-Inos-Chol SOLN Inject into the muscle every 30 (thirty) days.   [DISCONTINUED] clobetasol (TEMOVATE) 0.05 % external solution APPLY TO AFFECTED AREA EVERY DAY AT BEDTIME   [DISCONTINUED] MILK THISTLE PO Take by mouth.    Facility-Administered Encounter Medications as of 10/14/2023  Medication   cyanocobalamin  (VITAMIN B12) injection 1,000 mcg   [COMPLETED] cyanocobalamin  (VITAMIN B12) injection 1,000 mcg    Surgical History: Past Surgical History:  Procedure Laterality Date   BASAL CELL CARCINOMA EXCISION  02/03/2021   on nose   BREAST BIOPSY Right 2010 and 04/18/2015   BREAST BIOPSY Right 10/26/2019   x marker, PASH no atypia, excision recommended, not done till 6/23   BREAST EXCISIONAL BIOPSY Right 08/20/2021   Invasive moderately differentiated ductal adenocarcinoma, grade 2   BREAST LUMPECTOMY WITH RADIOACTIVE SEED LOCALIZATION Right 08/20/2021   Procedure: RIGHT BREAST LUMPECTOMY WITH RADIOACTIVE SEED LOCALIZATION;  Surgeon: Curvin Deward MOULD, MD;  Location: [facility];  Service: General;  Laterality: Right;   CATARACT EXTRACTION Right 04/23/2021   CATARACT EXTRACTION Left 05/04/2021   CESAREAN SECTION     corneal saltzman nodules removed Left 03/28/2021   ECTOPIC PREGNANCY SURGERY  1981   2   EYE SURGERY Right    keratectomy    ROBOTIC ASSISTED LAPAROSCOPIC OVARIAN CYSTECTOMY     SENTINEL NODE BIOPSY Right 10/02/2021   Procedure: RIGHT AXILLARY SENTINEL NODE BIOPSY;  Surgeon: Curvin Deward MOULD, MD;  Location: [facility];  Service: General;  Laterality: Right;   TONSILECTOMY/ADENOIDECTOMY WITH MYRINGOTOMY  1960    Medical History: Past Medical History:  Diagnosis Date   Arthritis    back hands   Breast cancer (HCC) 07/2021   Invasive carcinoma with DCIS   Breast mass, right    Cancer (HCC)    Cataract    GERD (gastroesophageal reflux disease)    Hyperlipidemia    Hypertension    Personal history of radiation therapy     Family History: Family History  Problem Relation Age of Onset   Heart disease Mother    Stroke Father    Skin cancer Father    Diabetes Brother    Diabetes Brother    Cervical cancer Paternal Aunt    Breast cancer Neg Hx      Social History   Socioeconomic History   Marital status: Married    Spouse name: Not on file   Number of children: Not on file   Years of education: Not on file   Highest education level: Not on file  Occupational History   Not on file  Tobacco Use   Smoking status: Never   Smokeless tobacco: Never  Vaping Use   Vaping status: Never Used  Substance and Sexual Activity   Alcohol use: Yes    Comment: occasionally / socially  Drug use: Never   Sexual activity: Yes    Birth control/protection: Post-menopausal  Other Topics Concern   Not on file  Social History Narrative   Not on file   Social Drivers of Health   Financial Resource Strain: Not on file  Food Insecurity: Not on file  Transportation Needs: Not on file  Physical Activity: Not on file  Stress: Not on file  Social Connections: Not on file  Intimate Partner Violence: Not on file      Review of Systems  Constitutional:  Negative for activity change, appetite change, chills, fatigue, fever and unexpected weight change.  HENT: Negative.  Negative for congestion, ear pain, rhinorrhea, sore throat and trouble swallowing.   Eyes: Negative.   Respiratory: Negative.  Negative for cough, chest tightness, shortness of breath and wheezing.   Cardiovascular: Negative.  Negative for chest pain.  Gastrointestinal: Negative.  Negative for abdominal pain, blood in stool, constipation, diarrhea, nausea and vomiting.  Endocrine: Negative.   Genitourinary: Negative.  Negative for difficulty urinating, dysuria, frequency, hematuria and urgency.  Musculoskeletal: Negative.  Negative for arthralgias, back pain, joint swelling, myalgias and neck pain.  Skin: Negative.  Negative for rash and wound.  Allergic/Immunologic: Negative.  Negative for immunocompromised state.  Neurological: Negative.  Negative for dizziness, seizures, numbness and headaches.  Hematological: Negative.   Psychiatric/Behavioral: Negative.  Negative for  behavioral problems, self-injury and suicidal ideas. The patient is not nervous/anxious.     Vital Signs: BP 138/86   Pulse 65   Temp 98.3 F (36.8 C)   Resp 16   Ht 5' 2.5 (1.588 m)   Wt 145 lb (65.8 kg)   SpO2 98%   BMI 26.10 kg/m    Physical Exam Vitals reviewed.  Constitutional:      General: She is not in acute distress.    Appearance: Normal appearance. She is well-developed. She is not ill-appearing or diaphoretic.  HENT:     Head: Normocephalic and atraumatic.     Right Ear: Tympanic membrane, ear canal and external ear normal.     Left Ear: Ear canal and external ear normal.     Nose: Nose normal.     Mouth/Throat:     Mouth: Mucous membranes are moist.     Pharynx: Oropharynx is clear. No oropharyngeal exudate or posterior oropharyngeal erythema.  Eyes:     General: No scleral icterus.       Right eye: No discharge.        Left eye: No discharge.     Extraocular Movements: Extraocular movements intact.     Conjunctiva/sclera: Conjunctivae normal.     Pupils: Pupils are equal, round, and reactive to light.  Neck:     Thyroid: No thyromegaly.     Vascular: No JVD.     Trachea: No tracheal deviation.     Comments: Slightly decreased ROM  Cardiovascular:     Rate and Rhythm: Normal rate and regular rhythm.     Pulses: Normal pulses.     Heart sounds: Normal heart sounds. No murmur heard.    No friction rub. No gallop.  Pulmonary:     Effort: Pulmonary effort is normal. No respiratory distress.     Breath sounds: Normal breath sounds. No stridor. No wheezing or rales.  Chest:     Chest wall: No tenderness.     Comments: Declined clinical breast exam. Recently had breast exam by oncology and has upcoming mammogram.  Abdominal:     General: Bowel  sounds are normal. There is no distension.     Palpations: Abdomen is soft. There is no mass.     Tenderness: There is no abdominal tenderness. There is no guarding or rebound.  Musculoskeletal:        General: No  tenderness or deformity. Normal range of motion.     Cervical back: Normal range of motion and neck supple. No rigidity or tenderness.  Lymphadenopathy:     Cervical: No cervical adenopathy.  Skin:    General: Skin is warm and dry.     Capillary Refill: Capillary refill takes less than 2 seconds.     Coloration: Skin is not pale.     Findings: No erythema or rash.  Neurological:     Mental Status: She is alert and oriented to person, place, and time.     Cranial Nerves: No cranial nerve deficit.     Motor: No abnormal muscle tone.     Coordination: Coordination normal.     Deep Tendon Reflexes: Reflexes are normal and symmetric.  Psychiatric:        Mood and Affect: Mood normal.        Behavior: Behavior normal.        Thought Content: Thought content normal.        Judgment: Judgment normal.        Assessment/Plan: 1. Encounter for subsequent annual wellness visit (AWV) in Medicare patient (Primary) Age-appropriate preventive screenings and vaccinations discussed, annual physical exam completed. Routine labs for health maintenance ordered (if needed). PHM updated.    2. Osteopenia, unspecified location Continue calcium  and vitamin D  supplement. OTC  3. Mixed hyperlipidemia Last LDL was 190 in August last year, patient will need to get updated lipid panel drawn soon. Cholesterol levels are influenced by the anastrozole  that she is taking.   4. Hyperbilirubinemia Resolved   5. B12 deficiency B12 injection administered today.  - cyanocobalamin  (VITAMIN B12) injection 1,000 mcg     General Counseling: Aryani verbalizes understanding of the findings of todays visit and agrees with plan of treatment. I have discussed any further diagnostic evaluation that may be needed or ordered today. We also reviewed her medications today. she has been encouraged to call the office with any questions or concerns that should arise related to todays visit.    No orders of the defined  types were placed in this encounter.   Meds ordered this encounter  Medications   cyanocobalamin  (VITAMIN B12) injection 1,000 mcg    Return for F/U, PAP ONLY, Justun Anaya PCP and have labs done prior to visit. .   Total time spent:30 Minutes Time spent includes review of chart, medications, test results, and follow up plan with the patient.   [place] Controlled Substance Database was reviewed by me.  This patient was seen by Mardy Maxin, FNP-C in collaboration with Dr. Sigrid Bathe as a part of collaborative care agreement.  Deandre Stansel R. Maxin, MSN, FNP-C Internal medicine

## 2023-11-03 ENCOUNTER — Ambulatory Visit: Admitting: Oncology

## 2023-11-03 ENCOUNTER — Other Ambulatory Visit

## 2023-11-05 ENCOUNTER — Inpatient Hospital Stay: Attending: Oncology

## 2023-11-05 ENCOUNTER — Inpatient Hospital Stay: Admitting: Oncology

## 2023-11-05 ENCOUNTER — Encounter: Payer: Self-pay | Admitting: Oncology

## 2023-11-05 VITALS — BP 136/74 | HR 59 | Temp 97.6°F | Resp 18 | Wt 143.9 lb

## 2023-11-05 DIAGNOSIS — Z170 Estrogen receptor positive status [ER+]: Secondary | ICD-10-CM

## 2023-11-05 DIAGNOSIS — Z17411 Hormone receptor positive with human epidermal growth factor receptor 2 negative status: Secondary | ICD-10-CM | POA: Insufficient documentation

## 2023-11-05 DIAGNOSIS — Z79811 Long term (current) use of aromatase inhibitors: Secondary | ICD-10-CM | POA: Insufficient documentation

## 2023-11-05 DIAGNOSIS — C50411 Malignant neoplasm of upper-outer quadrant of right female breast: Secondary | ICD-10-CM

## 2023-11-05 DIAGNOSIS — M8580 Other specified disorders of bone density and structure, unspecified site: Secondary | ICD-10-CM | POA: Diagnosis not present

## 2023-11-05 LAB — CBC WITH DIFFERENTIAL (CANCER CENTER ONLY)
Abs Immature Granulocytes: 0.01 K/uL (ref 0.00–0.07)
Basophils Absolute: 0 K/uL (ref 0.0–0.1)
Basophils Relative: 1 %
Eosinophils Absolute: 0.1 K/uL (ref 0.0–0.5)
Eosinophils Relative: 3 %
HCT: 40.5 % (ref 36.0–46.0)
Hemoglobin: 13.9 g/dL (ref 12.0–15.0)
Immature Granulocytes: 0 %
Lymphocytes Relative: 39 %
Lymphs Abs: 1.7 K/uL (ref 0.7–4.0)
MCH: 32.1 pg (ref 26.0–34.0)
MCHC: 34.3 g/dL (ref 30.0–36.0)
MCV: 93.5 fL (ref 80.0–100.0)
Monocytes Absolute: 0.2 K/uL (ref 0.1–1.0)
Monocytes Relative: 6 %
Neutro Abs: 2.2 K/uL (ref 1.7–7.7)
Neutrophils Relative %: 51 %
Platelet Count: 207 K/uL (ref 150–400)
RBC: 4.33 MIL/uL (ref 3.87–5.11)
RDW: 12.2 % (ref 11.5–15.5)
WBC Count: 4.2 K/uL (ref 4.0–10.5)
nRBC: 0 % (ref 0.0–0.2)

## 2023-11-05 LAB — CMP (CANCER CENTER ONLY)
ALT: 18 U/L (ref 0–44)
AST: 21 U/L (ref 15–41)
Albumin: 4.1 g/dL (ref 3.5–5.0)
Alkaline Phosphatase: 52 U/L (ref 38–126)
Anion gap: 8 (ref 5–15)
BUN: 12 mg/dL (ref 8–23)
CO2: 28 mmol/L (ref 22–32)
Calcium: 9.6 mg/dL (ref 8.9–10.3)
Chloride: 102 mmol/L (ref 98–111)
Creatinine: 0.59 mg/dL (ref 0.44–1.00)
GFR, Estimated: >60 mL/min (ref >60)
Glucose, Bld: 99 mg/dL (ref 70–99)
Potassium: 3.9 mmol/L (ref 3.5–5.1)
Sodium: 138 mmol/L (ref 135–145)
Total Bilirubin: 1.1 mg/dL (ref 0.0–1.2)
Total Protein: 6.9 g/dL (ref 6.5–8.1)

## 2023-11-05 NOTE — Assessment & Plan Note (Addendum)
 Right upper breast invasive carcinoma plus DCIS, ER/PR positive, HER2 negative.  Ki-67 20%. Axillary LN negative.  pT1c pN0 Oncotype DX 18, no adjuvant chemotherapy.  S/p Adjuvant Radiation.  Continue -Arimidex  1mg  daily, Duration 5 years, till Oct 2028 Continue annual mammogram, Feb 2026

## 2023-11-05 NOTE — Assessment & Plan Note (Signed)
 Continue calcium  and vitamin D  supplementation.  Sept 2024  DEXA osteopenia, FRAX major hip pathological fracture 16%- Patient declined bone strengthening agents.

## 2023-11-05 NOTE — Progress Notes (Signed)
 Hematology/Oncology Progress note [phone] [phone]            Patient Care Team: Liana Fish, NP as PCP - General (Nurse Practitioner) Georgina Shasta POUR, RN as Oncology Nurse Navigator Lenn Aran, MD as Consulting Physician (Radiation Oncology) Babara Call, MD as Consulting Physician (Oncology) Curvin Deward MOULD, MD as Consulting Physician (General Surgery)  ASSESSMENT & PLAN:   Cancer Staging  Malignant neoplasm of upper-outer quadrant of right breast in female, estrogen receptor positive [facility]) Staging form: Breast, AJCC 8th Edition - Pathologic stage from 10/02/2021: Stage IA (pT1c, pN0, cM0, G2, ER+, PR+, HER2-, Oncotype DX score: 18) - Signed by Babara Call, MD on 12/22/2021   Malignant neoplasm of upper-outer quadrant of right breast in female, estrogen receptor positive (HCC) Right upper breast invasive carcinoma plus DCIS, ER/PR positive, HER2 negative.  Ki-67 20%. Axillary LN negative.  pT1c pN0 Oncotype DX 18, no adjuvant chemotherapy.  S/p Adjuvant Radiation.  Continue -Arimidex  1mg  daily, Duration 5 years, till Oct 2028 Continue annual mammogram, Feb 2026  Osteopenia Continue calcium  and vitamin D  supplementation.  Sept 2024  DEXA osteopenia, FRAX major hip pathological fracture 16%- Patient declined bone strengthening agents.    Orders Placed This Encounter  Procedures   MM 3D DIAGNOSTIC MAMMOGRAM BILATERAL BREAST    Standing Status:   Future    Expected Date:   04/05/2024    Expiration Date:   11/04/2024    Reason for Exam (SYMPTOM  OR DIAGNOSIS REQUIRED):   hx breast cancer    Preferred imaging location?:   [facility]   CBC with Differential (Cancer Center Only)    Standing Status:   Future    Expected Date:   05/04/2024    Expiration Date:   08/02/2024   CMP (Cancer Center only)    Standing Status:   Future    Expected Date:   05/04/2024    Expiration Date:   08/02/2024   Follow up in 6 months All questions were answered. The  patient knows to call the clinic with any problems, questions or concerns. Call Babara, MD, PhD [facility] Hematology Oncology 11/05/2023    CHIEF COMPLAINTS/REASON FOR VISIT:  Follow up for Stage I breast cancer  HISTORY OF PRESENTING ILLNESS:   Kristen Reid is a  73 y.o.  female presents for follow up of  right breast cancer Oncology history listed as below Oncology History  Malignant neoplasm of upper-outer quadrant of right breast in female, estrogen receptor positive (HCC)  10/10/2019 Mammogram   Unilateral breast mammogram showed partially obscured mass with associated architectural distortion within the upper slightly inner right breast.  Without definitive sonographic correlates.  Stereotactic biopsy was recommended.   10/03/2020 Imaging   1. Stable appearance of RIGHT breast architectural distortion which demonstrated fibrosis and fibrocystic change. X clip is in appropriate position. Recommend surgical consultation for consideration of excision versus close monitoring. 2. No mammographic evidence of malignancy in the LEFT breast   05/02/2021 Mammogram   Unilateral diagnostic mammogram right 1. Possible slight increase in size of the distorted mass at the site of the X shaped biopsy marking clip in the upper-inner right breast. 2.  No evidence of right axillary lymphadenopathy.   09/12/2021 Initial Diagnosis   Malignant neoplasm of upper-outer quadrant of right breast in female, estrogen receptor positive (HCC)  -10/06/2019 right breast superior stereotactic core needle biopsy showed benign mammary parenchyma with stromal fibrosis, fibrocystic changes, and focal usual ductal hyperplasia.  Negative for atypical  proliferative breast disease. -08/20/2021, right breast lumpectomy showed invasive moderately differentiated ductal adenocarcinoma, grade 2, associated with DCIS in situ, intermediate nuclear grade, solid type with focal necrosis.  Margins free (invasive tumor 1 mm from  superior margin; DCIS 1 mm from superior margin) pT1c pNx    10/02/2021 Cancer Staging   Staging form: Breast, AJCC 8th Edition - Pathologic stage from 10/02/2021: Stage IA (pT1c, pN0, cM0, G2, ER+, PR+, HER2-, Oncotype DX score: 18) - Signed by Babara Call, MD on 12/22/2021 Stage prefix: Initial diagnosis Multigene prognostic tests performed: Oncotype DX Recurrence score range: Greater than or equal to 11 Histologic grading system: 3 grade system   10/02/2021 Surgery   Patient underwent sentinel lymph node biopsy.  All 4 lymph nodes are negative.   10/06/2021 Oncotype testing   Oncotype DX recurrence score 18, distant recurrence risk at 9 years 5%, group average absolute chemotherapy benefit less than 1%.   11/27/2021 - 12/08/2021 Radiation Therapy   Adjuvant right breast radiation.    12/22/2021 -  Anti-estrogen oral therapy   Started on Arimidex  1mg  daily.     Menarche 73 years of age Menopause in 85s. Very short period of time of hormone replacement therapy, less than 1 year Previous OCP for couple of years. Denies any family history of breast cancer.  Great aunt was diagnosed with uterine cancer.  No other cancer family history.  INTERVAL HISTORY Kristen Reid is a 73 y.o. female who has above history reviewed by me today presents for follow up visit for Stage IA right breast cancer. She tolerates Arimidex  with manageable side effects, joint pain.    Review of Systems  Constitutional:  Negative for appetite change, chills, fatigue and fever.  HENT:   Negative for hearing loss and voice change.   Eyes:  Negative for eye problems.  Respiratory:  Negative for chest tightness and cough.   Cardiovascular:  Negative for chest pain.  Gastrointestinal:  Negative for abdominal distention, abdominal pain and blood in stool.  Endocrine: Negative for hot flashes.  Genitourinary:  Negative for difficulty urinating and frequency.   Musculoskeletal:  Positive for arthralgias.  Skin:   Negative for itching and rash.  Neurological:  Negative for extremity weakness.  Hematological:  Negative for adenopathy.  Psychiatric/Behavioral:  Negative for confusion.     MEDICAL HISTORY:  Past Medical History:  Diagnosis Date   Arthritis    back hands   Breast cancer (HCC) 07/2021   Invasive carcinoma with DCIS   Breast mass, right    Cancer (HCC)    Cataract    GERD (gastroesophageal reflux disease)    Hyperlipidemia    Hypertension    Personal history of radiation therapy     SURGICAL HISTORY: Past Surgical History:  Procedure Laterality Date   BASAL CELL CARCINOMA EXCISION  02/03/2021   on nose   BREAST BIOPSY Right 2010 and 04/18/2015   BREAST BIOPSY Right 10/26/2019   x marker, PASH no atypia, excision recommended, not done till 6/23   BREAST EXCISIONAL BIOPSY Right 08/20/2021   Invasive moderately differentiated ductal adenocarcinoma, grade 2   BREAST LUMPECTOMY WITH RADIOACTIVE SEED LOCALIZATION Right 08/20/2021   Procedure: RIGHT BREAST LUMPECTOMY WITH RADIOACTIVE SEED LOCALIZATION;  Surgeon: Curvin Deward MOULD, MD;  Location: [facility];  Service: General;  Laterality: Right;   CATARACT EXTRACTION Right 04/23/2021   CATARACT EXTRACTION Left 05/04/2021   CESAREAN SECTION     corneal saltzman nodules removed Left 03/28/2021   ECTOPIC PREGNANCY  SURGERY  1981   2   EYE SURGERY Right    keratectomy    ROBOTIC ASSISTED LAPAROSCOPIC OVARIAN CYSTECTOMY     SENTINEL NODE BIOPSY Right 10/02/2021   Procedure: RIGHT AXILLARY SENTINEL NODE BIOPSY;  Surgeon: Curvin Deward MOULD, MD;  Location: [facility];  Service: General;  Laterality: Right;   TONSILECTOMY/ADENOIDECTOMY WITH MYRINGOTOMY  1960    SOCIAL HISTORY: Social History   Socioeconomic History   Marital status: Married    Spouse name: Not on file   Number of children: Not on file   Years of education: Not on file   Highest education level: Not on file  Occupational History   Not  on file  Tobacco Use   Smoking status: Never   Smokeless tobacco: Never  Vaping Use   Vaping status: Never Used  Substance and Sexual Activity   Alcohol use: Yes    Comment: occasionally / socially   Drug use: Never   Sexual activity: Yes    Birth control/protection: Post-menopausal  Other Topics Concern   Not on file  Social History Narrative   Not on file   Social Drivers of Health   Financial Resource Strain: Not on file  Food Insecurity: Not on file  Transportation Needs: Not on file  Physical Activity: Not on file  Stress: Not on file  Social Connections: Not on file  Intimate Partner Violence: Not on file    FAMILY HISTORY: Family History  Problem Relation Age of Onset   Heart disease Mother    Stroke Father    Skin cancer Father    Diabetes Brother    Diabetes Brother    Cervical cancer Paternal Aunt    Breast cancer Neg Hx     ALLERGIES:  has no known allergies.  MEDICATIONS:  Current Outpatient Medications  Medication Sig Dispense Refill   anastrozole  (ARIMIDEX ) 1 MG tablet Take 1 tablet (1 mg total) by mouth daily. 90 tablet 1   calcium  carbonate (OS-CAL) 600 MG TABS tablet Take 600 mg by mouth 2 (two) times daily with a meal.     Cholecalciferol (VITAMIN D3) 2000 units TABS Take by mouth.     Coenzyme Q10 (COQ10) 200 MG CAPS Take by mouth.     dutasteride (AVODART) 0.5 MG capsule TAKE ONE PILL DAILY X 1 WEEK, THEN ONE PILL ONCE WEEKLY     famotidine  (PEPCID ) 20 MG tablet Take 1 tablet (20 mg total) by mouth daily. 90 tablet 3   fluorometholone (FML) 0.1 % ophthalmic suspension      fluticasone (FLONASE) 50 MCG/ACT nasal spray Place 2 sprays into both nostrils daily.     fluticasone (VERAMYST) 27.5 MCG/SPRAY nasal spray Place 2 sprays into the nose daily.     loratadine (CLARITIN) 10 MG tablet Take 10 mg by mouth daily.     prednisoLONE acetate (PRED FORTE) 1 % ophthalmic suspension PLEASE SEE ATTACHED FOR DETAILED DIRECTIONS     VEVYE 0.1 % SOLN  Apply 1 drop to eye 2 (two) times daily.     Vit B12-Methionine-Inos-Chol SOLN Inject into the muscle every 30 (thirty) days.     Current Facility-Administered Medications  Medication Dose Route Frequency Provider Last Rate Last Admin   cyanocobalamin  (VITAMIN B12) injection 1,000 mcg  1,000 mcg Intramuscular Once Abernathy, Alyssa, NP         PHYSICAL EXAMINATION: ECOG PERFORMANCE STATUS: 1 - Symptomatic but completely ambulatory Vitals:   11/05/23 0949  BP: 136/74  Pulse: (!) 59  Resp: 18  Temp: 97.6 F (36.4 C)  SpO2: 97%   Filed Weights   11/05/23 0949  Weight: 143 lb 14.4 oz (65.3 kg)    Physical Exam Constitutional:      General: She is not in acute distress. HENT:     Head: Normocephalic and atraumatic.  Eyes:     General: No scleral icterus. Cardiovascular:     Rate and Rhythm: Normal rate and regular rhythm.  Pulmonary:     Effort: Pulmonary effort is normal. No respiratory distress.     Breath sounds: No wheezing.  Abdominal:     General: Bowel sounds are normal. There is no distension.     Palpations: Abdomen is soft.  Musculoskeletal:        General: No deformity. Normal range of motion.     Cervical back: Normal range of motion and neck supple.  Skin:    General: Skin is warm.     Findings: No erythema or rash.  Neurological:     Mental Status: She is alert and oriented to person, place, and time. Mental status is at baseline.  Psychiatric:        Mood and Affect: Mood normal.    Breast exam was performed in seated and lying down position. Patient is status post right lumpectomy with a well-healed surgical scar. No palpable breast masses bilaterally.  No palpable axillary adenopathy bilaterally.  LABORATORY DATA:  I have reviewed the data as listed    Latest Ref Rng & Units 11/05/2023    9:31 AM 03/23/2023    1:05 PM 10/07/2022    8:43 AM  CBC  WBC 4.0 - 10.5 K/uL 4.2  4.5  3.4   Hemoglobin 12.0 - 15.0 g/dL 86.0  86.0  85.4   Hematocrit 36.0 -  46.0 % 40.5  40.3  42.6   Platelets 150 - 400 K/uL 207  199  230       Latest Ref Rng & Units 11/05/2023    9:31 AM 03/23/2023    1:05 PM 10/07/2022    8:43 AM  CMP  Glucose 70 - 99 mg/dL 99  888  94   BUN 8 - 23 mg/dL 12  17  11    Creatinine 0.44 - 1.00 mg/dL 9.40  9.47  9.33   Sodium 135 - 145 mmol/L 138  139  141   Potassium 3.5 - 5.1 mmol/L 3.9  3.8  4.4   Chloride 98 - 111 mmol/L 102  102  103   CO2 22 - 32 mmol/L 28  28  27    Calcium  8.9 - 10.3 mg/dL 9.6  9.8  9.7   Total Protein 6.5 - 8.1 g/dL 6.9  7.4  6.5   Total Bilirubin 0.0 - 1.2 mg/dL 1.1  1.1  0.8   Alkaline Phos 38 - 126 U/L 52  54  64   AST 15 - 41 U/L 21  22  31    ALT 0 - 44 U/L 18  20  26       RADIOGRAPHIC STUDIES: I have personally reviewed the radiological images as listed and agreed with the findings in the report. No results found.

## 2023-11-12 ENCOUNTER — Encounter: Payer: Self-pay | Admitting: Nurse Practitioner

## 2023-11-17 ENCOUNTER — Encounter: Payer: Self-pay | Admitting: Nurse Practitioner

## 2023-11-21 ENCOUNTER — Other Ambulatory Visit: Payer: Self-pay | Admitting: Oncology

## 2023-11-25 ENCOUNTER — Other Ambulatory Visit: Payer: Self-pay | Admitting: Oncology

## 2023-11-25 DIAGNOSIS — Z170 Estrogen receptor positive status [ER+]: Secondary | ICD-10-CM

## 2023-12-01 ENCOUNTER — Other Ambulatory Visit: Payer: Self-pay | Admitting: Nurse Practitioner

## 2023-12-01 DIAGNOSIS — K219 Gastro-esophageal reflux disease without esophagitis: Secondary | ICD-10-CM

## 2023-12-08 ENCOUNTER — Other Ambulatory Visit: Payer: Self-pay | Admitting: Nurse Practitioner

## 2023-12-08 DIAGNOSIS — M8580 Other specified disorders of bone density and structure, unspecified site: Secondary | ICD-10-CM | POA: Diagnosis not present

## 2023-12-08 DIAGNOSIS — E559 Vitamin D deficiency, unspecified: Secondary | ICD-10-CM | POA: Diagnosis not present

## 2023-12-08 DIAGNOSIS — E538 Deficiency of other specified B group vitamins: Secondary | ICD-10-CM | POA: Diagnosis not present

## 2023-12-08 DIAGNOSIS — E782 Mixed hyperlipidemia: Secondary | ICD-10-CM | POA: Diagnosis not present

## 2023-12-09 LAB — VITAMIN D 25 HYDROXY (VIT D DEFICIENCY, FRACTURES): Vit D, 25-Hydroxy: 49.4 ng/mL (ref 30.0–100.0)

## 2023-12-09 LAB — LIPID PANEL
Chol/HDL Ratio: 3.1 ratio (ref 0.0–4.4)
Cholesterol, Total: 274 mg/dL — ABNORMAL HIGH (ref 100–199)
HDL: 88 mg/dL
LDL Chol Calc (NIH): 176 mg/dL — ABNORMAL HIGH (ref 0–99)
Triglycerides: 65 mg/dL (ref 0–149)
VLDL Cholesterol Cal: 10 mg/dL (ref 5–40)

## 2023-12-09 LAB — B12 AND FOLATE PANEL
Folate: 4.1 ng/mL
Vitamin B-12: 487 pg/mL (ref 232–1245)

## 2023-12-09 LAB — T4, FREE: Free T4: 1.09 ng/dL (ref 0.82–1.77)

## 2023-12-09 LAB — TSH: TSH: 2.62 u[IU]/mL (ref 0.450–4.500)

## 2023-12-10 ENCOUNTER — Ambulatory Visit (INDEPENDENT_AMBULATORY_CARE_PROVIDER_SITE_OTHER): Admitting: Nurse Practitioner

## 2023-12-10 ENCOUNTER — Encounter: Payer: Self-pay | Admitting: Nurse Practitioner

## 2023-12-10 VITALS — BP 130/82 | HR 60 | Temp 97.4°F | Resp 16 | Ht 62.5 in | Wt 145.8 lb

## 2023-12-10 DIAGNOSIS — Z23 Encounter for immunization: Secondary | ICD-10-CM

## 2023-12-10 DIAGNOSIS — R87610 Atypical squamous cells of undetermined significance on cytologic smear of cervix (ASC-US): Secondary | ICD-10-CM

## 2023-12-10 DIAGNOSIS — C50411 Malignant neoplasm of upper-outer quadrant of right female breast: Secondary | ICD-10-CM | POA: Diagnosis not present

## 2023-12-10 DIAGNOSIS — E538 Deficiency of other specified B group vitamins: Secondary | ICD-10-CM | POA: Diagnosis not present

## 2023-12-10 DIAGNOSIS — Z124 Encounter for screening for malignant neoplasm of cervix: Secondary | ICD-10-CM | POA: Diagnosis not present

## 2023-12-10 DIAGNOSIS — M8580 Other specified disorders of bone density and structure, unspecified site: Secondary | ICD-10-CM

## 2023-12-10 DIAGNOSIS — Z170 Estrogen receptor positive status [ER+]: Secondary | ICD-10-CM | POA: Diagnosis not present

## 2023-12-10 MED ORDER — CYANOCOBALAMIN 1000 MCG/ML IJ SOLN
1000.0000 ug | Freq: Once | INTRAMUSCULAR | Status: AC
  Administered 2023-12-10: 1000 ug via INTRAMUSCULAR

## 2023-12-10 NOTE — Progress Notes (Signed)
 [facility] [address] [place], [place] [address]  Internal MEDICINE  Office Visit Note  Patient Name: Kristen Reid  [id number]  [id number]  Date of Service: 12/10/2023  Chief Complaint  Patient presents with   Gastroesophageal Reflux   Hypertension   Hyperlipidemia   Follow-up     Alexxia presents for a follow-up visit for repeat pap smear, low B12 level and flu vaccine.  Repeat pap smear due today -- last pap was negative for HPV but result was atypical squamous cells of undetermined significance.  Requesting flu vaccine today B12 deficiency -- due for B12 injection today.  Sees oncology for monitoring for breast cancer, completed initial treatment and has been on an aromatase inhibitor for 2 years now. She reports that the oncologist recently told her she will continue the medication for 3 more years. There was also a suggestion of possibly switching to tamoxifen but this has not been done yet.    Current Medication: Outpatient Encounter Medications as of 12/10/2023  Medication Sig   anastrozole  (ARIMIDEX ) 1 MG tablet TAKE 1 TABLET BY MOUTH EVERY DAY   calcium  carbonate (OS-CAL) 600 MG TABS tablet Take 600 mg by mouth 2 (two) times daily with a meal.   Cholecalciferol (VITAMIN D3) 2000 units TABS Take by mouth.   Coenzyme Q10 (COQ10) 200 MG CAPS Take by mouth.   dutasteride (AVODART) 0.5 MG capsule TAKE ONE PILL DAILY X 1 WEEK, THEN ONE PILL ONCE WEEKLY   famotidine  (PEPCID ) 20 MG tablet TAKE 1 TABLET BY MOUTH EVERY DAY   fluorometholone (FML) 0.1 % ophthalmic suspension    fluticasone (FLONASE) 50 MCG/ACT nasal spray Place 2 sprays into both nostrils daily.   loratadine (CLARITIN) 10 MG tablet Take 10 mg by mouth daily.   VEVYE 0.1 % SOLN Apply 1 drop to eye 2 (two) times daily.   Vit B12-Methionine-Inos-Chol SOLN Inject into the muscle every 30 (thirty) days.   [DISCONTINUED] fluticasone (VERAMYST) 27.5 MCG/SPRAY nasal spray Place 2 sprays into the  nose daily. (Patient not taking: Reported on 12/10/2023)   [DISCONTINUED] prednisoLONE acetate (PRED FORTE) 1 % ophthalmic suspension PLEASE SEE ATTACHED FOR DETAILED DIRECTIONS (Patient not taking: Reported on 12/10/2023)   Facility-Administered Encounter Medications as of 12/10/2023  Medication   cyanocobalamin  (VITAMIN B12) injection 1,000 mcg   cyanocobalamin  (VITAMIN B12) injection 1,000 mcg    Surgical History: Past Surgical History:  Procedure Laterality Date   BASAL CELL CARCINOMA EXCISION  02/03/2021   on nose   BREAST BIOPSY Right 2010 and 04/18/2015   BREAST BIOPSY Right 10/26/2019   x marker, PASH no atypia, excision recommended, not done till 6/23   BREAST EXCISIONAL BIOPSY Right 08/20/2021   Invasive moderately differentiated ductal adenocarcinoma, grade 2   BREAST LUMPECTOMY WITH RADIOACTIVE SEED LOCALIZATION Right 08/20/2021   Procedure: RIGHT BREAST LUMPECTOMY WITH RADIOACTIVE SEED LOCALIZATION;  Surgeon: Curvin Deward MOULD, MD;  Location: [facility];  Service: General;  Laterality: Right;   CATARACT EXTRACTION Right 04/23/2021   CATARACT EXTRACTION Left 05/04/2021   CESAREAN SECTION     corneal saltzman nodules removed Left 03/28/2021   ECTOPIC PREGNANCY SURGERY  1981   2   EYE SURGERY Right    keratectomy    ROBOTIC ASSISTED LAPAROSCOPIC OVARIAN CYSTECTOMY     SENTINEL NODE BIOPSY Right 10/02/2021   Procedure: RIGHT AXILLARY SENTINEL NODE BIOPSY;  Surgeon: Curvin Deward MOULD, MD;  Location: [facility];  Service: General;  Laterality: Right;   TONSILECTOMY/ADENOIDECTOMY  WITH MYRINGOTOMY  1960    Medical History: Past Medical History:  Diagnosis Date   Arthritis    back hands   Breast cancer (HCC) 07/2021   Invasive carcinoma with DCIS   Breast mass, right    Cancer (HCC)    Cataract    GERD (gastroesophageal reflux disease)    Hyperlipidemia    Hypertension    Personal history of radiation therapy     Family History: Family  History  Problem Relation Age of Onset   Heart disease Mother    Stroke Father    Skin cancer Father    Diabetes Brother    Diabetes Brother    Cervical cancer Paternal Aunt    Breast cancer Neg Hx     Social History   Socioeconomic History   Marital status: Married    Spouse name: Not on file   Number of children: Not on file   Years of education: Not on file   Highest education level: Not on file  Occupational History   Not on file  Tobacco Use   Smoking status: Never   Smokeless tobacco: Never  Vaping Use   Vaping status: Never Used  Substance and Sexual Activity   Alcohol use: Yes    Comment: occasionally / socially   Drug use: Never   Sexual activity: Yes    Birth control/protection: Post-menopausal  Other Topics Concern   Not on file  Social History Narrative   Not on file   Social Drivers of Health   Financial Resource Strain: Not on file  Food Insecurity: Not on file  Transportation Needs: Not on file  Physical Activity: Not on file  Stress: Not on file  Social Connections: Not on file  Intimate Partner Violence: Not on file      Review of Systems  Vital Signs: BP 130/82   Pulse 60   Temp (!) 97.4 F (36.3 C)   Resp 16   Ht 5' 2.5 (1.588 m)   Wt 145 lb 12.8 oz (66.1 kg)   SpO2 97%   BMI 26.24 kg/m    Physical Exam Constitutional:      General: She is not in acute distress.    Appearance: Normal appearance. She is not ill-appearing.  HENT:     Head: Normocephalic and atraumatic.  Eyes:     Pupils: Pupils are equal, round, and reactive to light.  Cardiovascular:     Rate and Rhythm: Normal rate and regular rhythm.  Pulmonary:     Effort: Pulmonary effort is normal. No respiratory distress.  Abdominal:     Hernia: There is no hernia in the left inguinal area or right inguinal area.  Genitourinary:    General: Normal vulva.     Exam position: Lithotomy position.     Pubic Area: No rash or pubic lice.      Labia:        Right: No  rash, tenderness, lesion or injury.        Left: No rash, tenderness, lesion or injury.      Urethra: No prolapse, urethral pain, urethral swelling or urethral lesion.     Vagina: Normal. No signs of injury and foreign body. No vaginal discharge, erythema, tenderness, bleeding, lesions or prolapsed vaginal walls.     Cervix: Friability and cervical bleeding present. No cervical motion tenderness, discharge, lesion, erythema or eversion.     Uterus: Normal.      Adnexa: Right adnexa normal and left adnexa normal.  Rectum: Normal. No mass, anal fissure or external hemorrhoid. Normal anal tone.  Lymphadenopathy:     Lower Body: No right inguinal adenopathy. No left inguinal adenopathy.  Neurological:     Mental Status: She is alert and oriented to person, place, and time.  Psychiatric:        Mood and Affect: Mood normal.        Behavior: Behavior normal.        Assessment/Plan: 1. Atypical squamous cells of undetermined significance on cytologic smear of cervix (ASC-US ) (Primary) Repeat pap smear done. Patient had a results of ASC-US  last year and has a family history of cervical cancer being diagnosed at the age of 99.  - IGP, Aptima HPV  2. Malignant neoplasm of upper-outer quadrant of right breast in female, estrogen receptor positive (HCC) Followed closely by oncology. Continue anastrozole  as prescribed by oncology.   3. Osteopenia, unspecified location Contineu calcium  and vitamin D  supplement.   4. B12 deficiency B12 injection administered in office today.  - cyanocobalamin  (VITAMIN B12) injection 1,000 mcg  5. Routine cervical smear Routine pelvic exam with pap smear done today.   6. Flu vaccine need Flu vaccine administered in office today without issues.  - Influenza, MDCK, trivalent, PF(Flucelvax egg-free)   General Counseling: Parisha verbalizes understanding of the findings of todays visit and agrees with plan of treatment. I have discussed any further  diagnostic evaluation that may be needed or ordered today. We also reviewed her medications today. she has been encouraged to call the office with any questions or concerns that should arise related to todays visit.    No orders of the defined types were placed in this encounter.   Meds ordered this encounter  Medications   cyanocobalamin  (VITAMIN B12) injection 1,000 mcg    Return for previously scheduled, AWV, Shadia Larose PCP in august and otherwise as needed. cont B12 inj visits.   Total time spent:30 Minutes Time spent includes review of chart, medications, test results, and follow up plan with the patient.   [place] Controlled Substance Database was reviewed by me.  This patient was seen by Mardy Maxin, FNP-C in collaboration with Dr. Sigrid Bathe as a part of collaborative care agreement.   Casanova Schurman R. Maxin, MSN, FNP-C Internal medicine

## 2023-12-11 ENCOUNTER — Encounter: Payer: Self-pay | Admitting: Nurse Practitioner

## 2023-12-11 DIAGNOSIS — E538 Deficiency of other specified B group vitamins: Secondary | ICD-10-CM | POA: Insufficient documentation

## 2023-12-14 LAB — IGP, APTIMA HPV: HPV Aptima: NEGATIVE

## 2023-12-15 ENCOUNTER — Ambulatory Visit: Payer: Self-pay | Admitting: Nurse Practitioner

## 2023-12-15 NOTE — Telephone Encounter (Signed)
-----   Message from [facility] sent at 12/15/2023  8:21 AM EDT ----- The pap smear was normal this time. We can do another in a few years if needed.  ----- Message ----- From: Rebecka Memos Lab Results In Sent: 12/14/2023   3:35 PM EDT To: Mardy Maxin, NP

## 2023-12-15 NOTE — Telephone Encounter (Signed)
 Patient notified

## 2023-12-15 NOTE — Progress Notes (Signed)
 The pap smear was normal this time. We can do another in a few years if needed.

## 2023-12-24 DIAGNOSIS — D2272 Melanocytic nevi of left lower limb, including hip: Secondary | ICD-10-CM | POA: Diagnosis not present

## 2023-12-24 DIAGNOSIS — L570 Actinic keratosis: Secondary | ICD-10-CM | POA: Diagnosis not present

## 2023-12-24 DIAGNOSIS — L821 Other seborrheic keratosis: Secondary | ICD-10-CM | POA: Diagnosis not present

## 2023-12-24 DIAGNOSIS — D225 Melanocytic nevi of trunk: Secondary | ICD-10-CM | POA: Diagnosis not present

## 2023-12-24 DIAGNOSIS — L6612 Frontal fibrosing alopecia: Secondary | ICD-10-CM | POA: Diagnosis not present

## 2023-12-24 DIAGNOSIS — Z85828 Personal history of other malignant neoplasm of skin: Secondary | ICD-10-CM | POA: Diagnosis not present

## 2023-12-24 DIAGNOSIS — D2262 Melanocytic nevi of left upper limb, including shoulder: Secondary | ICD-10-CM | POA: Diagnosis not present

## 2023-12-24 DIAGNOSIS — D2261 Melanocytic nevi of right upper limb, including shoulder: Secondary | ICD-10-CM | POA: Diagnosis not present

## 2024-01-10 ENCOUNTER — Ambulatory Visit (INDEPENDENT_AMBULATORY_CARE_PROVIDER_SITE_OTHER)

## 2024-01-10 DIAGNOSIS — E538 Deficiency of other specified B group vitamins: Secondary | ICD-10-CM | POA: Diagnosis not present

## 2024-01-10 MED ORDER — CYANOCOBALAMIN 1000 MCG/ML IJ SOLN
1000.0000 ug | Freq: Once | INTRAMUSCULAR | Status: AC
  Administered 2024-01-10: 1000 ug via INTRAMUSCULAR

## 2024-02-07 ENCOUNTER — Ambulatory Visit

## 2024-02-07 ENCOUNTER — Ambulatory Visit: Payer: Medicare PPO | Admitting: Radiation Oncology

## 2024-02-07 DIAGNOSIS — E538 Deficiency of other specified B group vitamins: Secondary | ICD-10-CM | POA: Diagnosis not present

## 2024-02-07 MED ORDER — CYANOCOBALAMIN 1000 MCG/ML IJ SOLN
1000.0000 ug | Freq: Once | INTRAMUSCULAR | Status: AC
  Administered 2024-02-07: 1000 ug via INTRAMUSCULAR

## 2024-03-13 ENCOUNTER — Ambulatory Visit (INDEPENDENT_AMBULATORY_CARE_PROVIDER_SITE_OTHER)

## 2024-03-13 DIAGNOSIS — E538 Deficiency of other specified B group vitamins: Secondary | ICD-10-CM | POA: Diagnosis not present

## 2024-03-13 MED ORDER — CYANOCOBALAMIN 1000 MCG/ML IJ SOLN
1000.0000 ug | Freq: Once | INTRAMUSCULAR | Status: AC
  Administered 2024-03-13: 1000 ug via INTRAMUSCULAR

## 2024-03-27 ENCOUNTER — Ambulatory Visit: Admitting: Radiation Oncology

## 2024-04-13 ENCOUNTER — Ambulatory Visit

## 2024-04-17 ENCOUNTER — Encounter

## 2024-05-05 ENCOUNTER — Other Ambulatory Visit

## 2024-05-05 ENCOUNTER — Ambulatory Visit: Admitting: Oncology

## 2024-05-08 ENCOUNTER — Ambulatory Visit: Admitting: Radiation Oncology

## 2024-05-08 ENCOUNTER — Ambulatory Visit: Admitting: Oncology

## 2024-05-08 ENCOUNTER — Other Ambulatory Visit

## 2024-10-16 ENCOUNTER — Ambulatory Visit: Admitting: Nurse Practitioner
